# Patient Record
Sex: Male | Born: 1965 | Race: Black or African American | Hispanic: No | Smoking: Former smoker
Health system: Southern US, Community
[De-identification: ages and names within clinical notes are randomized; demographics above are authoritative.]

## PROBLEM LIST (undated history)

## (undated) DIAGNOSIS — R0600 Dyspnea, unspecified: Secondary | ICD-10-CM

## (undated) DIAGNOSIS — I639 Cerebral infarction, unspecified: Secondary | ICD-10-CM

## (undated) DIAGNOSIS — I10 Essential (primary) hypertension: Secondary | ICD-10-CM

## (undated) DIAGNOSIS — I509 Heart failure, unspecified: Secondary | ICD-10-CM

## (undated) HISTORY — PX: NO PAST SURGERIES: SHX2092

## (undated) HISTORY — DX: Cerebral infarction, unspecified: I63.9

---

## 2002-02-26 ENCOUNTER — Encounter: Payer: Self-pay | Admitting: Emergency Medicine

## 2002-02-26 ENCOUNTER — Emergency Department (HOSPITAL_COMMUNITY): Admission: EM | Admit: 2002-02-26 | Discharge: 2002-02-26 | Payer: Self-pay | Admitting: Emergency Medicine

## 2003-03-18 ENCOUNTER — Encounter: Payer: Self-pay | Admitting: Emergency Medicine

## 2003-03-18 ENCOUNTER — Emergency Department (HOSPITAL_COMMUNITY): Admission: EM | Admit: 2003-03-18 | Discharge: 2003-03-18 | Payer: Self-pay

## 2016-07-28 ENCOUNTER — Encounter (HOSPITAL_COMMUNITY): Payer: Self-pay | Admitting: Emergency Medicine

## 2016-07-28 ENCOUNTER — Ambulatory Visit (INDEPENDENT_AMBULATORY_CARE_PROVIDER_SITE_OTHER): Payer: Self-pay

## 2016-07-28 ENCOUNTER — Ambulatory Visit (HOSPITAL_COMMUNITY)
Admission: EM | Admit: 2016-07-28 | Discharge: 2016-07-28 | Disposition: A | Discharge status: Home / Self Care | Payer: Self-pay | Attending: Family Medicine | Admitting: Family Medicine

## 2016-07-28 DIAGNOSIS — R0989 Other specified symptoms and signs involving the circulatory and respiratory systems: Secondary | ICD-10-CM

## 2016-07-28 DIAGNOSIS — Z87891 Personal history of nicotine dependence: Secondary | ICD-10-CM | POA: Insufficient documentation

## 2016-07-28 DIAGNOSIS — I878 Other specified disorders of veins: Secondary | ICD-10-CM | POA: Insufficient documentation

## 2016-07-28 DIAGNOSIS — R0602 Shortness of breath: Secondary | ICD-10-CM | POA: Insufficient documentation

## 2016-07-28 DIAGNOSIS — R918 Other nonspecific abnormal finding of lung field: Secondary | ICD-10-CM | POA: Insufficient documentation

## 2016-07-28 DIAGNOSIS — R609 Edema, unspecified: Secondary | ICD-10-CM

## 2016-07-28 DIAGNOSIS — Z79899 Other long term (current) drug therapy: Secondary | ICD-10-CM | POA: Insufficient documentation

## 2016-07-28 DIAGNOSIS — R600 Localized edema: Secondary | ICD-10-CM | POA: Insufficient documentation

## 2016-07-28 HISTORY — DX: Essential (primary) hypertension: I10

## 2016-07-28 LAB — BASIC METABOLIC PANEL
Anion gap: 7 (ref 5–15)
BUN: 17 mg/dL (ref 6–20)
CO2: 25 mmol/L (ref 22–32)
Calcium: 8.7 mg/dL — ABNORMAL LOW (ref 8.9–10.3)
Chloride: 108 mmol/L (ref 101–111)
Creatinine, Ser: 0.9 mg/dL (ref 0.61–1.24)
GFR calc Af Amer: >60 mL/min (ref >60)
GFR calc non Af Amer: >60 mL/min (ref >60)
Glucose, Bld: 88 mg/dL (ref 65–99)
Potassium: 3.8 mmol/L (ref 3.5–5.1)
Sodium: 140 mmol/L (ref 135–145)

## 2016-07-28 LAB — BRAIN NATRIURETIC PEPTIDE: B Natriuretic Peptide: 414.3 pg/mL — ABNORMAL HIGH (ref 0.0–100.0)

## 2016-07-28 MED ORDER — FUROSEMIDE 40 MG PO TABS: 40.0000 mg | ORAL_TABLET | Freq: Two times a day (BID) | ORAL | 0 refills | Status: DC

## 2016-07-28 MED ORDER — HYDROCHLOROTHIAZIDE 25 MG PO TABS: 25.0000 mg | ORAL_TABLET | Freq: Every day | ORAL | 1 refills | Status: DC

## 2016-07-28 NOTE — ED Provider Notes (Signed)
CSN: [id number]     Arrival date & time 07/28/16  1837 History   First MD Initiated Contact with Patient 07/28/16 1951     Chief Complaint  Patient presents with  . Shortness of Breath   (Consider location/radiation/quality/duration/timing/severity/associated sxs/prior Treatment) HPI THIS IS A NEW PROBLEM. SHORTNESS OF BREATH FOR OVER 1 MONTH. STATES HE THOUGHT HE WAS HAVING PANIC ATTACKS. SMOKER UNTIL RECENTLY. OVER THE LAST 2 DAYS HAS HAD LEFT LEG SWELLING. HAS NOT HAD HIS HCTZ SINCE MARCH. NO CALF PAIN. NO HISTORY OF DVT.  Past Medical History:  Diagnosis Date  . Hypertension    History reviewed. No pertinent surgical history. History reviewed. No pertinent family history. Social History  Substance Use Topics  . Smoking status: Former Smoker    Packs/day: 1.00    Years: 20.00    Types: Cigarettes    Quit date: 06/28/2016  . Smokeless tobacco: Never Used  . Alcohol use Yes     Comment: Socially    Review of Systems  Denies: HEADACHE, NAUSEA, ABDOMINAL PAIN, CHEST PAIN, CONGESTION, DYSURIA,    Allergies  Review of patient's allergies indicates no known allergies.  Home Medications   Prior to Admission medications   Medication Sig Start Date End Date Taking? Authorizing Provider  hydrochlorothiazide (HYDRODIURIL) 25 MG tablet Take 25 mg by mouth daily.    Historical Provider, MD   Meds Ordered and Administered this Visit  Medications - No data to display  BP 163/99 (BP Location: Left Arm)   Pulse 102   Temp 99 F (37.2 C) (Oral)   Resp 20   SpO2 97%  No data found.   Physical Exam NURSES NOTES AND VITAL SIGNS REVIEWED. CONSTITUTIONAL: Well developed, well nourished, no acute distress HEENT: normocephalic, atraumatic EYES: Conjunctiva normal NECK:normal ROM, supple, no adenopathy PULMONARY:No respiratory distress, normal effort, FEW CRACKLES BOTH BASES. CV: PITTING EDEMA LEFT >RIGHT UP TO KNEE.  ABDOMINAL: Soft, ND, NT BS+, No CVAT MUSCULOSKELETAL: Normal ROM  of all extremities,  SKIN: warm and dry without rash PSYCHIATRIC: Mood and affect, behavior are normal  Urgent Care Course   Clinical Course    Procedures (including critical care time)  Labs Review Labs Reviewed - No data to display  Imaging Review Dg Chest 2 View  Result Date: 07/28/2016 CLINICAL DATA:  Shortness of breath EXAM: CHEST  2 VIEW COMPARISON:  None. FINDINGS: Shallow lung inflation. Cardiomediastinal silhouette is enlarged. There are streaky opacities at both lung bases, likely atelectasis. Small amount of fluid is seen within the right major and minor fissures. There is splaying of the carina, consistent with left atrial hypertrophy. No overt pulmonary edema. No focal airspace consolidation. No pneumothorax or sizable pleural effusion. IMPRESSION: 1. Enlarged cardiomediastinal silhouette: pericardial effusion versus cardiomegaly alone. 2. Shallow lung inflation with mild vascular congestion without overt pulmonary edema. Electronically Signed   By: Deatra Robinson M.D.   On: 07/28/2016 20:18   DISCUSSED WITH PATIENT PRIOR TO DISCHARGE.   Visual Acuity Review  Right Eye Distance:   Left Eye Distance:   Bilateral Distance:    Right Eye Near:   Left Eye Near:    Bilateral Near:         MDM   1. Peripheral edema   2. Pulmonary congestion     Patient is reassured that there are no issues that require transfer to higher level of care at this time or additional tests. Patient is advised to continue home symptomatic treatment. Patient is advised that if there  are new or worsening symptoms to attend the emergency department, contact primary care provider, or return to UC. Instructions of care provided discharged home in stable condition.    THIS NOTE WAS GENERATED USING A VOICE RECOGNITION SOFTWARE PROGRAM. ALL REASONABLE EFFORTS  WERE MADE TO PROOFREAD THIS DOCUMENT FOR ACCURACY.  I have verbally reviewed the discharge instructions with the patient. A printed AVS  was given to the patient.  All questions were answered prior to discharge.      Tharon Aquas, PA 07/28/16 2118

## 2016-07-28 NOTE — ED Triage Notes (Signed)
The patient presented to the [facility] with a complaint of shortness of breath x 1 month that has gotten worse for the last 2 weeks. The patient reported that he has been out off his HCTZ since April 2017.

## 2017-02-10 ENCOUNTER — Encounter (HOSPITAL_COMMUNITY): Payer: Self-pay

## 2017-02-10 ENCOUNTER — Ambulatory Visit (HOSPITAL_COMMUNITY)
Admission: EM | Admit: 2017-02-10 | Discharge: 2017-02-10 | Disposition: A | Discharge status: Home / Self Care | Payer: Self-pay | Attending: Family Medicine | Admitting: Family Medicine

## 2017-02-10 ENCOUNTER — Inpatient Hospital Stay (HOSPITAL_COMMUNITY)
Admission: EM | Admit: 2017-02-10 | Discharge: 2017-02-14 | DRG: 287 | Disposition: A | Discharge status: Home / Self Care | Payer: Self-pay | Attending: Internal Medicine | Admitting: Internal Medicine

## 2017-02-10 ENCOUNTER — Encounter (HOSPITAL_COMMUNITY): Payer: Self-pay | Admitting: Emergency Medicine

## 2017-02-10 ENCOUNTER — Emergency Department (HOSPITAL_COMMUNITY): Payer: Self-pay

## 2017-02-10 DIAGNOSIS — I10 Essential (primary) hypertension: Secondary | ICD-10-CM

## 2017-02-10 DIAGNOSIS — Z87891 Personal history of nicotine dependence: Secondary | ICD-10-CM

## 2017-02-10 DIAGNOSIS — I110 Hypertensive heart disease with heart failure: Principal | ICD-10-CM | POA: Diagnosis present

## 2017-02-10 DIAGNOSIS — E876 Hypokalemia: Secondary | ICD-10-CM | POA: Diagnosis present

## 2017-02-10 DIAGNOSIS — I272 Pulmonary hypertension, unspecified: Secondary | ICD-10-CM | POA: Diagnosis present

## 2017-02-10 DIAGNOSIS — I420 Dilated cardiomyopathy: Secondary | ICD-10-CM | POA: Diagnosis present

## 2017-02-10 DIAGNOSIS — F17200 Nicotine dependence, unspecified, uncomplicated: Secondary | ICD-10-CM

## 2017-02-10 DIAGNOSIS — I5020 Unspecified systolic (congestive) heart failure: Secondary | ICD-10-CM

## 2017-02-10 DIAGNOSIS — Z720 Tobacco use: Secondary | ICD-10-CM | POA: Diagnosis present

## 2017-02-10 DIAGNOSIS — Z8249 Family history of ischemic heart disease and other diseases of the circulatory system: Secondary | ICD-10-CM

## 2017-02-10 DIAGNOSIS — R739 Hyperglycemia, unspecified: Secondary | ICD-10-CM | POA: Diagnosis present

## 2017-02-10 DIAGNOSIS — Z79899 Other long term (current) drug therapy: Secondary | ICD-10-CM

## 2017-02-10 DIAGNOSIS — I509 Heart failure, unspecified: Secondary | ICD-10-CM

## 2017-02-10 DIAGNOSIS — Z9111 Patient's noncompliance with dietary regimen: Secondary | ICD-10-CM

## 2017-02-10 DIAGNOSIS — Z9119 Patient's noncompliance with other medical treatment and regimen: Secondary | ICD-10-CM

## 2017-02-10 DIAGNOSIS — Z9114 Patient's other noncompliance with medication regimen: Secondary | ICD-10-CM

## 2017-02-10 DIAGNOSIS — Z833 Family history of diabetes mellitus: Secondary | ICD-10-CM

## 2017-02-10 DIAGNOSIS — I160 Hypertensive urgency: Secondary | ICD-10-CM | POA: Diagnosis present

## 2017-02-10 DIAGNOSIS — Z791 Long term (current) use of non-steroidal anti-inflammatories (NSAID): Secondary | ICD-10-CM

## 2017-02-10 DIAGNOSIS — F1010 Alcohol abuse, uncomplicated: Secondary | ICD-10-CM | POA: Diagnosis present

## 2017-02-10 DIAGNOSIS — I2510 Atherosclerotic heart disease of native coronary artery without angina pectoris: Secondary | ICD-10-CM | POA: Diagnosis present

## 2017-02-10 DIAGNOSIS — J9601 Acute respiratory failure with hypoxia: Secondary | ICD-10-CM

## 2017-02-10 DIAGNOSIS — Z6841 Body Mass Index (BMI) 40.0 and over, adult: Secondary | ICD-10-CM

## 2017-02-10 DIAGNOSIS — I5042 Chronic combined systolic (congestive) and diastolic (congestive) heart failure: Secondary | ICD-10-CM

## 2017-02-10 DIAGNOSIS — I5043 Acute on chronic combined systolic (congestive) and diastolic (congestive) heart failure: Secondary | ICD-10-CM | POA: Diagnosis present

## 2017-02-10 HISTORY — DX: Dyspnea, unspecified: R06.00

## 2017-02-10 HISTORY — DX: Heart failure, unspecified: I50.9

## 2017-02-10 LAB — CBC WITH DIFFERENTIAL/PLATELET
Basophils Absolute: 0 10*3/uL (ref 0.0–0.1)
Basophils Relative: 0 %
EOS ABS: 0.2 10*3/uL (ref 0.0–0.7)
Eosinophils Relative: 2 %
HCT: 42.9 % (ref 39.0–52.0)
HEMOGLOBIN: 13.8 g/dL (ref 13.0–17.0)
LYMPHS ABS: 2.7 10*3/uL (ref 0.7–4.0)
Lymphocytes Relative: 29 %
MCH: 32.5 pg (ref 26.0–34.0)
MCHC: 32.2 g/dL (ref 30.0–36.0)
MCV: 100.9 fL — ABNORMAL HIGH (ref 78.0–100.0)
Monocytes Absolute: 0.6 10*3/uL (ref 0.1–1.0)
Monocytes Relative: 6 %
NEUTROS PCT: 63 %
Neutro Abs: 6 10*3/uL (ref 1.7–7.7)
Platelets: 222 10*3/uL (ref 150–400)
RBC: 4.25 MIL/uL (ref 4.22–5.81)
RDW: 12.8 % (ref 11.5–15.5)
WBC: 9.5 10*3/uL (ref 4.0–10.5)

## 2017-02-10 LAB — BASIC METABOLIC PANEL
ANION GAP: 10 (ref 5–15)
BUN: 14 mg/dL (ref 6–20)
CHLORIDE: 109 mmol/L (ref 101–111)
CO2: 22 mmol/L (ref 22–32)
Calcium: 8.6 mg/dL — ABNORMAL LOW (ref 8.9–10.3)
Creatinine, Ser: 0.93 mg/dL (ref 0.61–1.24)
GFR calc non Af Amer: >60 mL/min (ref >60)
Glucose, Bld: 103 mg/dL — ABNORMAL HIGH (ref 65–99)
POTASSIUM: 4 mmol/L (ref 3.5–5.1)
SODIUM: 141 mmol/L (ref 135–145)

## 2017-02-10 LAB — I-STAT TROPONIN, ED: TROPONIN I, POC: 0.04 ng/mL (ref 0.00–0.08)

## 2017-02-10 LAB — BRAIN NATRIURETIC PEPTIDE: B NATRIURETIC PEPTIDE 5: 752.4 pg/mL — AB (ref 0.0–100.0)

## 2017-02-10 MED ORDER — NITROGLYCERIN 2 % TD OINT
1.0000 [in_us] | TOPICAL_OINTMENT | Freq: Once | TRANSDERMAL | Status: AC
  Administered 2017-02-10: 1 [in_us] via TOPICAL
  Filled 2017-02-10: qty 1

## 2017-02-10 MED ORDER — ACETAMINOPHEN 325 MG PO TABS: 650.0000 mg | ORAL_TABLET | ORAL | Status: DC | PRN

## 2017-02-10 MED ORDER — FOLIC ACID 1 MG PO TABS
1.0000 mg | ORAL_TABLET | Freq: Every day | ORAL | Status: DC
  Administered 2017-02-10 – 2017-02-14 (×5): 1 mg via ORAL
  Filled 2017-02-10 (×5): qty 1

## 2017-02-10 MED ORDER — HYDRALAZINE HCL 25 MG PO TABS
25.0000 mg | ORAL_TABLET | Freq: Three times a day (TID) | ORAL | Status: DC
  Administered 2017-02-10 – 2017-02-11 (×2): 25 mg via ORAL
  Filled 2017-02-10 (×2): qty 1

## 2017-02-10 MED ORDER — LORAZEPAM 2 MG/ML IJ SOLN: 1.0000 mg | Freq: Four times a day (QID) | INTRAMUSCULAR | Status: DC | PRN

## 2017-02-10 MED ORDER — FUROSEMIDE 10 MG/ML IJ SOLN
40.0000 mg | Freq: Two times a day (BID) | INTRAMUSCULAR | Status: DC
  Administered 2017-02-10 – 2017-02-12 (×5): 40 mg via INTRAVENOUS
  Filled 2017-02-10 (×5): qty 4

## 2017-02-10 MED ORDER — ENOXAPARIN SODIUM 60 MG/0.6ML ~~LOC~~ SOLN
60.0000 mg | SUBCUTANEOUS | Status: DC
  Administered 2017-02-10 – 2017-02-11 (×2): 60 mg via SUBCUTANEOUS
  Filled 2017-02-10 (×2): qty 0.6

## 2017-02-10 MED ORDER — VITAMIN B-1 100 MG PO TABS
100.0000 mg | ORAL_TABLET | Freq: Every day | ORAL | Status: DC
  Administered 2017-02-10 – 2017-02-14 (×5): 100 mg via ORAL
  Filled 2017-02-10 (×6): qty 1

## 2017-02-10 MED ORDER — HYDRALAZINE HCL 20 MG/ML IJ SOLN
10.0000 mg | INTRAMUSCULAR | Status: DC | PRN
  Administered 2017-02-10: 10 mg via INTRAVENOUS
  Filled 2017-02-10: qty 1

## 2017-02-10 MED ORDER — FUROSEMIDE 10 MG/ML IJ SOLN
40.0000 mg | Freq: Once | INTRAMUSCULAR | Status: AC
  Administered 2017-02-10: 40 mg via INTRAVENOUS
  Filled 2017-02-10: qty 4

## 2017-02-10 MED ORDER — ADULT MULTIVITAMIN W/MINERALS CH
1.0000 | ORAL_TABLET | Freq: Every day | ORAL | Status: DC
  Administered 2017-02-10 – 2017-02-14 (×5): 1 via ORAL
  Filled 2017-02-10 (×5): qty 1

## 2017-02-10 MED ORDER — THIAMINE HCL 100 MG/ML IJ SOLN
100.0000 mg | Freq: Every day | INTRAMUSCULAR | Status: DC
  Filled 2017-02-10: qty 2

## 2017-02-10 MED ORDER — ONDANSETRON HCL 4 MG/2ML IJ SOLN: 4.0000 mg | Freq: Four times a day (QID) | INTRAMUSCULAR | Status: DC | PRN

## 2017-02-10 MED ORDER — SODIUM CHLORIDE 0.9 % IV SOLN: 250.0000 mL | INTRAVENOUS | Status: DC | PRN

## 2017-02-10 MED ORDER — LISINOPRIL 10 MG PO TABS
10.0000 mg | ORAL_TABLET | Freq: Every day | ORAL | Status: DC
  Administered 2017-02-10: 10 mg via ORAL
  Filled 2017-02-10: qty 1

## 2017-02-10 MED ORDER — SODIUM CHLORIDE 0.9% FLUSH: 3.0000 mL | INTRAVENOUS | Status: DC | PRN

## 2017-02-10 MED ORDER — ASPIRIN EC 81 MG PO TBEC
81.0000 mg | DELAYED_RELEASE_TABLET | Freq: Every day | ORAL | Status: DC
  Administered 2017-02-10 – 2017-02-14 (×5): 81 mg via ORAL
  Filled 2017-02-10 (×5): qty 1

## 2017-02-10 MED ORDER — SODIUM CHLORIDE 0.9% FLUSH
3.0000 mL | Freq: Two times a day (BID) | INTRAVENOUS | Status: DC
  Administered 2017-02-10 – 2017-02-14 (×7): 3 mL via INTRAVENOUS

## 2017-02-10 MED ORDER — LORAZEPAM 1 MG PO TABS
1.0000 mg | ORAL_TABLET | Freq: Four times a day (QID) | ORAL | Status: DC | PRN
  Administered 2017-02-10: 1 mg via ORAL
  Filled 2017-02-10: qty 1

## 2017-02-10 NOTE — Discharge Instructions (Signed)
Diagnosed with congestive heart failure. This means your heart cannot keep up the amount of fluid that you are retaining. Therefore we are sending you to the emergency department for further monitoring and treatment.

## 2017-02-10 NOTE — ED Triage Notes (Signed)
Pt states that he began having shortness of breath last night. He reports hx of same when he was out of his HTN meds. He reports he is currently out of meds again. No distress noted, skin warm and dry.

## 2017-02-10 NOTE — ED Notes (Signed)
Admitting provider at bedside.

## 2017-02-10 NOTE — ED Provider Notes (Signed)
[facility]    CSN: [id number] Arrival date & time: 02/10/17  1212     History   Chief Complaint Chief Complaint  Patient presents with  . Shortness of Breath    HPI Nicholas Kelly. is a 51 y.o. male.   The patient presented to the [facility] with a complaint of SOB that started last night. He's had a history of congestive heart failure dating back to last October. He's had no chest pain but he is quite short of breath especially when he walks. He's also had significant increase in his pedal edema.      Past Medical History:  Diagnosis Date  . Hypertension     There are no active problems to display for this patient.   History reviewed. No pertinent surgical history.     Home Medications    Prior to Admission medications   Medication Sig Start Date End Date Taking? Authorizing Provider  hydrochlorothiazide (HYDRODIURIL) 25 MG tablet Take 1 tablet (25 mg total) by mouth daily. 07/28/16  Yes Tharon Aquas, PA    Family History History reviewed. No pertinent family history.  Social History Social History  Substance Use Topics  . Smoking status: Former Smoker    Packs/day: 1.00    Years: 20.00    Types: Cigarettes    Quit date: 06/28/2016  . Smokeless tobacco: Never Used  . Alcohol use Yes     Comment: Socially     Allergies   Patient has no known allergies.   Review of Systems Review of Systems  HENT: Negative.   Respiratory: Positive for shortness of breath.   Cardiovascular: Positive for leg swelling.  Neurological: Negative.   All other systems reviewed and are negative.    Physical Exam Triage Vital Signs ED Triage Vitals  Enc Vitals Group     BP 02/10/17 1311 (!) 173/119     Pulse Rate 02/10/17 1311 96     Resp 02/10/17 1311 20     Temp 02/10/17 1311 98.2 F (36.8 C)     Temp Source 02/10/17 1311 Oral     SpO2 02/10/17 1311 96 %     Weight --      Height --      Head Circumference --      Peak Flow --      Pain  Score 02/10/17 1310 0     Pain Loc --      Pain Edu? --      Excl. in GC? --    No data found.   Updated Vital Signs BP (!) 173/119 (BP Location: Right Arm)   Pulse 96   Temp 98.2 F (36.8 C) (Oral)   Resp 20   SpO2 96%    Physical Exam  Constitutional: He is oriented to person, place, and time. He appears well-developed and well-nourished.  HENT:  Right Ear: External ear normal.  Left Ear: External ear normal.  Mouth/Throat: Oropharynx is clear and moist.  Eyes: Conjunctivae and EOM are normal. Pupils are equal, round, and reactive to light.  Neck: Normal range of motion. Neck supple.  Cardiovascular: Normal rate, regular rhythm and normal heart sounds.   Pulmonary/Chest: He has wheezes.  Patient is clearly short of breath while sitting. He is able to carry on a conversation but he is short of breath. He has audible wheezes.  Musculoskeletal: Normal range of motion. He exhibits edema.  Neurological: He is alert and oriented to person, place, and time.  Skin: Skin is warm and dry.  Nursing note and vitals reviewed.    UC Treatments / Results  Labs (all labs ordered are listed, but only abnormal results are displayed) Labs Reviewed - No data to display  EKG  EKG Interpretation None       Radiology No results found.  Procedures Procedures (including critical care time)  Medications Ordered in UC Medications - No data to display   Initial Impression / Assessment and Plan / UC Course  I have reviewed the triage vital signs and the nursing notes.  Pertinent labs & imaging results that were available during my care of the patient were reviewed by me and considered in my medical decision making (see chart for details).     Final Clinical Impressions(s) / UC Diagnoses   Final diagnoses:  Systolic congestive heart failure, unspecified HF chronicity (HCC)    New Prescriptions New Prescriptions   No medications on file  Patient discharged and taken to  [facility] emergency department.   Elvina Sidle, MD 02/10/17 1335

## 2017-02-10 NOTE — ED Notes (Signed)
Pt. Oxygen saturations low 90's. Pt. Placed on 2L via nasal cannula at this time.

## 2017-02-10 NOTE — ED Notes (Signed)
Pt made aware of bed assignment 

## 2017-02-10 NOTE — ED Notes (Signed)
Paged K.Schorr to Jonny Ruiz, [place]

## 2017-02-10 NOTE — ED Triage Notes (Signed)
The patient presented to the [facility] with a complaint of SOB that started last night.

## 2017-02-10 NOTE — ED Provider Notes (Addendum)
[facility] Note   CSN: [id number] Arrival date & time: 02/10/17  1344     History   Chief Complaint Chief Complaint  Patient presents with  . Shortness of Breath    HPI Nicholas Kelly. is a 51 y.o. male.Complains of shortness breath gradual onset 2:30 AM on 02/07/2017. Shortness of breath is worse with lying down improved with sitting up.Marland Kitchen He's been noncompliant with his HCTZ for the past 3 days as he's run out. He denies any chest pain. Seen at urgent care center earlier today, sent here for further evaluation.  HPI  Past Medical History:  Diagnosis Date  . Hypertension     There are no active problems to display for this patient.   History reviewed. No pertinent surgical history.     Home Medications    Prior to Admission medications   Medication Sig Start Date End Date Taking? Authorizing Provider  hydrochlorothiazide (HYDRODIURIL) 25 MG tablet Take 1 tablet (25 mg total) by mouth daily. 07/28/16   Tharon Aquas, PA    Family History History reviewed. No pertinent family history.  Social History Social History  Substance Use Topics  . Smoking status: Former Smoker    Packs/day: 1.00    Years: 20.00    Types: Cigarettes    Quit date: 06/28/2016  . Smokeless tobacco: Never Used  . Alcohol use Yes     Comment: Socially    Drinks approximately 3 alcoholic beverages per day Allergies   Patient has no known allergies.   Review of Systems Review of Systems  Constitutional: Negative.   HENT: Negative.   Respiratory: Positive for shortness of breath.   Cardiovascular: Positive for leg swelling.  Gastrointestinal: Negative.   Musculoskeletal: Negative.   Skin: Negative.   Neurological: Negative.   Psychiatric/Behavioral: Negative.   All other systems reviewed and are negative.    Physical Exam Updated Vital Signs BP (!) 170/132 (BP Location: Right Arm)   Pulse (!) 102   Temp 98.8 F (37.1 C) (Oral)   Resp (!) 24   Ht  5\' 8"  (1.727 m)   Wt 280 lb (127 kg)   SpO2 95%   BMI 42.57 kg/m   Physical Exam  Constitutional: He appears well-developed and well-nourished.  Appears mildly dyspneic  HENT:  Head: Normocephalic and atraumatic.  Eyes: Conjunctivae are normal. Pupils are equal, round, and reactive to light.  Neck: Neck supple. JVD present. No tracheal deviation present. No thyromegaly present.  Cardiovascular: Normal rate and regular rhythm.   No murmur heard. Pulmonary/Chest: Effort normal and breath sounds normal.  Mildly dyspneic, tachypnea  Abdominal: Soft. Bowel sounds are normal. He exhibits no distension. There is no tenderness.  Obese  Musculoskeletal: Normal range of motion. He exhibits edema. He exhibits no tenderness.  1+ pretibial pitting edema bilaterally  Neurological: He is alert. Coordination normal.  Skin: Skin is warm and dry. No rash noted.  Psychiatric: He has a normal mood and affect.  Nursing note and vitals reviewed.    ED Treatments / Results  Labs (all labs ordered are listed, but only abnormal results are displayed) Labs Reviewed  CBC WITH DIFFERENTIAL/PLATELET  BASIC METABOLIC PANEL  I-STAT TROPOININ, ED   Results for orders placed or performed during the hospital encounter of 02/10/17  CBC with Differential/Platelet  Result Value Ref Range   WBC 9.5 4.0 - 10.5 K/uL   RBC 4.25 4.22 - 5.81 MIL/uL   Hemoglobin 13.8 13.0 - 17.0 g/dL  HCT 42.9 39.0 - 52.0 %   MCV 100.9 (H) 78.0 - 100.0 fL   MCH 32.5 26.0 - 34.0 pg   MCHC 32.2 30.0 - 36.0 g/dL   RDW 16.1 09.6 - 04.5 %   Platelets 222 150 - 400 K/uL   Neutrophils Relative % 63 %   Neutro Abs 6.0 1.7 - 7.7 K/uL   Lymphocytes Relative 29 %   Lymphs Abs 2.7 0.7 - 4.0 K/uL   Monocytes Relative 6 %   Monocytes Absolute 0.6 0.1 - 1.0 K/uL   Eosinophils Relative 2 %   Eosinophils Absolute 0.2 0.0 - 0.7 K/uL   Basophils Relative 0 %   Basophils Absolute 0.0 0.0 - 0.1 K/uL  Basic metabolic panel  Result Value  Ref Range   Sodium 141 135 - 145 mmol/L   Potassium 4.0 3.5 - 5.1 mmol/L   Chloride 109 101 - 111 mmol/L   CO2 22 22 - 32 mmol/L   Glucose, Bld 103 (H) 65 - 99 mg/dL   BUN 14 6 - 20 mg/dL   Creatinine, Ser 4.09 0.61 - 1.24 mg/dL   Calcium 8.6 (L) 8.9 - 10.3 mg/dL   GFR calc non Af Amer >60 >60 mL/min   GFR calc Af Amer >60 >60 mL/min   Anion gap 10 5 - 15  I-stat troponin, ED  Result Value Ref Range   Troponin i, poc 0.04 0.00 - 0.08 ng/mL   Comment 3           Dg Chest 2 View  Result Date: 02/10/2017 CLINICAL DATA:  Shortness of breath and cough for 2 days. Patient reports being unable to take his antihypertensive medication for the past 4 days. Former smoker. EXAM: CHEST  2 VIEW COMPARISON:  Chest x-ray of July 28, 2016 FINDINGS: The lungs are adequately inflated. There is no focal infiltrate. The interstitial markings are coarse The right hilar structures are prominent. The pulmonary vascularity is not engorged. The cardiac silhouette is enlarged but stable. There is calcification in the wall of the thoracic aorta. There is no pleural effusion. The bony thorax exhibits no acute abnormality. IMPRESSION: Chronic bronchitic changes with superimposed low-grade CHF with mild interstitial edema. There is no alveolar pneumonia. Thoracic aortic atherosclerosis. Electronically Signed   By: David  [place] M.D.   On: 02/10/2017 14:42   EKG  EKG Interpretation  Date/Time:  Wednesday February 10 2017 13:51:17 EDT Ventricular Rate:  89 PR Interval:  176 QRS Duration: 108 QT Interval:  402 QTC Calculation: 489 R Axis:   -31 Text Interpretation:  Normal sinus rhythm Biatrial enlargement Left axis deviation Left ventricular hypertrophy Nonspecific T wave abnormality Prolonged QT Abnormal ECG No old tracing to compare Confirmed by Ethelda Chick  MD, Panayiota Larkin [phone]) on 02/10/2017 3:09:26 PM     Chest x-ray viewed by me Radiology Dg Chest 2 View  Result Date: 02/10/2017 CLINICAL DATA:  Shortness of breath  and cough for 2 days. Patient reports being unable to take his antihypertensive medication for the past 4 days. Former smoker. EXAM: CHEST  2 VIEW COMPARISON:  Chest x-ray of July 28, 2016 FINDINGS: The lungs are adequately inflated. There is no focal infiltrate. The interstitial markings are coarse The right hilar structures are prominent. The pulmonary vascularity is not engorged. The cardiac silhouette is enlarged but stable. There is calcification in the wall of the thoracic aorta. There is no pleural effusion. The bony thorax exhibits no acute abnormality. IMPRESSION: Chronic bronchitic changes with superimposed low-grade CHF  with mild interstitial edema. There is no alveolar pneumonia. Thoracic aortic atherosclerosis. Electronically Signed   By: David  [place] M.D.   On: 02/10/2017 14:42    Procedures Procedures (including critical care time)  Medications Ordered in ED Medications  furosemide (LASIX) injection 40 mg (not administered)     Initial Impression / Assessment and Plan / ED Course  I have reviewed the triage vital signs and the nursing notes.  Pertinent labs & imaging results that were available during my care of the patient were reviewed by me and considered in my medical decision making (see chart for details). 5:25 PM patient had diuresed approximately 1200 mL of urine after treatment with intravenous Lasix and topical nitroglycerin and feels improved.    I consulted Hospitalist on call  Dr Gonzella Lex who will arrange for 23 hour observation, telemetry  Final Clinical Impressions(s) / ED Diagnoses  Diagnosis #1 congestive heart failure #2 hypertension #3 medication noncompliance Final diagnoses:  None    New Prescriptions New Prescriptions   No medications on file     Doug Sou, MD 02/10/17 1738    Doug Sou, MD 02/10/17 1739

## 2017-02-10 NOTE — H&P (Addendum)
TRH H&P   Patient Demographics:    Nicholas Kelly, is a 51 y.o. male  MRN: [id number]   DOB - December 04, 1965  Admit Date - 02/10/2017  Outpatient Primary MD for the patient is No PCP Per Patient  Referring MD: Dr. Ethelda Chick  Outpatient Specialists: none  Patient coming from: None  Chief Complaint  Patient presents with  . Shortness of Breath      HPI:    Nicholas Kelly  is a 51 y.o. obese male with history of hypertension who presented to the urgent care with acute onset of shortness of breath starting last night. Patient was diagnosed with congestive heart failure in October 2017 and discharged home to continue his HCTZ. Patient reports that he ran out of his HCTZ prescription few days back. Last time he had acute onset of shortness of breath and felt like a panic attack. He denied any chest pain, orthopnea or PND. Denies headache, dizziness, fevers, chills, nausea, vomiting, abdominal pain, dysuria or diarrhea. Denies recent travel or sick contact. Denies recent illness. She reports that he is not following any diet regimen or consult restriction. He has quit smoking for the past 2 weeks (prior to that was smoking at least one pack per day). He continues to drink everyday but denies any illicit drug use. Patient was told to come to the ED. He is unaware of having leg swellings.  Course in the ED He was tachycardic to 110, tachypneic in the 20s, had accelerated hypertension with BP of 174/11 mmhg. he was desaturating to the 80s and placed on nasal cannula 3 L after which sats improved to mid 90s. Blood work done in the ED was normal. Chest x-ray showed findings of CHF with pulmonary edema.  Patient was given 40 mg IV Lasix after which he diuresed about 1200 mL and felt better. Nitroglycerin ointment was applied. Hospitalist admission requested to telemetry.   Review of  systems:    In addition to the HPI above, No Fever-chills, No Headache, No changes with Vision or hearing, No problems swallowing food or Liquids, No Chest pain, Cough ; Shortness of breath No Abdominal pain, No Nausea or vomiting, Bowel movements are regular, No Blood in stool or Urine, No dysuria, No new skin rashes or bruises, No new joints pains-aches,  No new weakness, tingling, numbness in any extremity, No recent weight gain or loss, No polyuria, polydypsia or polyphagia, No significant Mental Stressors.  A full 10 point Review of Systems was done, except as stated above, all other Review of Systems were negative.   With Past History of the following :    Past Medical History:  Diagnosis Date  . Hypertension       Past surgical history No past Surgical history   Social History:     Social History  Substance Use Topics  . Smoking status: Former Smoker  Packs/day: 1.00    Years: 20.00    Types: Cigarettes    Quit date: 06/28/2016  . Smokeless tobacco: Never Used  . Alcohol use Yes     Comment: Socially     Lives - At home  Mobility - independent   Family History :   Mother had diabetes and heart disease   Home Medications:   Prior to Admission medications   Medication Sig Start Date End Date Taking? Authorizing Provider  hydrochlorothiazide (HYDRODIURIL) 25 MG tablet Take 1 tablet (25 mg total) by mouth daily. 07/28/16  Yes Tharon Aquas, PA  ibuprofen (ADVIL,MOTRIN) 200 MG tablet Take 200 mg by mouth every 6 (six) hours as needed for moderate pain.   Yes Historical Provider, MD     Allergies:    No Known Allergies   Physical Exam:   Vitals  Blood pressure (!) 152/112, pulse (!) 103, temperature 98.8 F (37.1 C), temperature source Oral, resp. rate (!) 22, height 5\' 8"  (1.727 m), weight 127 kg (280 lb), SpO2 93 %.   Gen.: Middle aged obese male not in distress HEENT: No pallor, moist mucosa, supple neck, no JVD Chest: Diminished  bilateral breath sounds due to body habitus CVS: Normal S1 and S2, no murmurs rub or gallop GI: Soft, nondistended, nontender, bowel sounds present Musculoskeletal: 1+ pitting edema bilaterally CNS: Alert and oriented   Data Review:    CBC  Recent Labs Lab 02/10/17 1525  WBC 9.5  HGB 13.8  HCT 42.9  PLT 222  MCV 100.9*  MCH 32.5  MCHC 32.2  RDW 12.8  LYMPHSABS 2.7  MONOABS 0.6  EOSABS 0.2  BASOSABS 0.0   ------------------------------------------------------------------------------------------------------------------  Chemistries   Recent Labs Lab 02/10/17 1525  NA 141  K 4.0  CL 109  CO2 22  GLUCOSE 103*  BUN 14  CREATININE 0.93  CALCIUM 8.6*   ------------------------------------------------------------------------------------------------------------------ estimated creatinine clearance is 120.6 mL/min (by C-G formula based on SCr of 0.93 mg/dL). ------------------------------------------------------------------------------------------------------------------ No results for input(s): TSH, T4TOTAL, T3FREE, THYROIDAB in the last 72 hours.  Invalid input(s): FREET3  Coagulation profile No results for input(s): INR, PROTIME in the last 168 hours. ------------------------------------------------------------------------------------------------------------------- No results for input(s): DDIMER in the last 72 hours. -------------------------------------------------------------------------------------------------------------------  Cardiac Enzymes No results for input(s): CKMB, TROPONINI, MYOGLOBIN in the last 168 hours.  Invalid input(s): CK ------------------------------------------------------------------------------------------------------------------    Component Value Date/Time   BNP 414.3 (H) 07/28/2016 2052     ---------------------------------------------------------------------------------------------------------------  Urinalysis No results  found for: COLORURINE, APPEARANCEUR, LABSPEC, PHURINE, GLUCOSEU, HGBUR, BILIRUBINUR, KETONESUR, PROTEINUR, UROBILINOGEN, NITRITE, LEUKOCYTESUR  ----------------------------------------------------------------------------------------------------------------   Imaging Results:    Dg Chest 2 View  Result Date: 02/10/2017 CLINICAL DATA:  Shortness of breath and cough for 2 days. Patient reports being unable to take his antihypertensive medication for the past 4 days. Former smoker. EXAM: CHEST  2 VIEW COMPARISON:  Chest x-ray of July 28, 2016 FINDINGS: The lungs are adequately inflated. There is no focal infiltrate. The interstitial markings are coarse The right hilar structures are prominent. The pulmonary vascularity is not engorged. The cardiac silhouette is enlarged but stable. There is calcification in the wall of the thoracic aorta. There is no pleural effusion. The bony thorax exhibits no acute abnormality. IMPRESSION: Chronic bronchitic changes with superimposed low-grade CHF with mild interstitial edema. There is no alveolar pneumonia. Thoracic aortic atherosclerosis. Electronically Signed   By: David  [place] M.D.   On: 02/10/2017 14:42    My personal review of EKG: Normal sinus rhythm at  89,  LAD with LVH, no ST-T changes  Assessment & Plan:    Principal Problem:   Acute CHF (congestive heart failure) (HCC) Admit to telemetry. Risk factors include uncontrolled hypertension, obesity, tobacco and alcohol abuse and? Family history of heart disease. Diuresed with IV Lasix 40 mg twice a day. Strict I/O and daily weight. O2 via nasal cannula. Obtain 2-D echo. Start on aspirin 81 mg daily. Hold off on beta blocker given acute CHF symptoms. Add lisinopril 10 mg daily and hydralazine 25 mg 3 times a day.  Check lipid panel and no globe and A1c. Check urine drug screen. (Reports remote history of cocaine use) Needs heart failure teaching. Encouraged on quitting smoking.   Active Problems:    Hypertensive urgency Dietary and medication nonadherence. ran out of his prescription few days back. Placed on nitroglycerin patch in the ED. Started on IV Lasix, lisinopril and hydralazine. Will place on when necessary IV hydralazine.  Acute respiratory failure with hypoxia Secondary to acute CHF. Management as above.    Obesity Needs counseling on diet and exercise.    Tobacco abuse Reports being a heavy smoker in the past. Quit for the past 2 weeks.  Alcohol use Drinks about 6 packs of beer daily in addition to few liquor 3-4 days a week. Monitor on CIWA.      DVT Prophylaxis  Lovenox -   AM Labs Ordered, also please review Full Orders  Family Communication: Admission, patients condition and plan of care including tests being ordered have been discussed with the patient   Code Status Full code  Likely DC to  home  Condition GUARDED   Consults called: none  Admission status: inpatient  Time spent in minutes :70   Eddie North M.D on 02/10/2017 at 6:09 PM  Between 7am to 7pm - Pager - [phone]. After 7pm go to [email or web address] - password [facility]  [facility] - Office  [phone]

## 2017-02-10 NOTE — ED Notes (Signed)
EDP at bedside  

## 2017-02-10 NOTE — Progress Notes (Signed)
Patient transferred to [address] from ED. Patient oriented to room CCMD notified. No complaints of pain at this time. Patient in bed resting. Will continue to monitor.

## 2017-02-11 ENCOUNTER — Inpatient Hospital Stay (HOSPITAL_COMMUNITY): Payer: Self-pay

## 2017-02-11 DIAGNOSIS — I160 Hypertensive urgency: Secondary | ICD-10-CM

## 2017-02-11 DIAGNOSIS — I509 Heart failure, unspecified: Secondary | ICD-10-CM

## 2017-02-11 LAB — BASIC METABOLIC PANEL
Anion gap: 8 (ref 5–15)
BUN: 13 mg/dL (ref 6–20)
CHLORIDE: 105 mmol/L (ref 101–111)
CO2: 29 mmol/L (ref 22–32)
Calcium: 8.8 mg/dL — ABNORMAL LOW (ref 8.9–10.3)
Creatinine, Ser: 1.05 mg/dL (ref 0.61–1.24)
GFR calc Af Amer: >60 mL/min (ref >60)
GFR calc non Af Amer: >60 mL/min (ref >60)
GLUCOSE: 92 mg/dL (ref 65–99)
Potassium: 3.9 mmol/L (ref 3.5–5.1)
Sodium: 142 mmol/L (ref 135–145)

## 2017-02-11 LAB — ECHOCARDIOGRAM COMPLETE
CHL CUP DOP CALC LVOT VTI: 14.2 cm
CHL CUP RV SYS PRESS: 48 mmHg
CHL CUP TV REG PEAK VELOCITY: 327 cm/s
EERAT: 16.67
FS: 10 % — AB (ref 28–44)
Height: 68 in
IVS/LV PW RATIO, ED: 1
LA ID, A-P, ES: 53 mm
LA diam index: 2.19 cm/m2
LA vol A4C: 116 ml
LA vol index: 40.6 mL/m2
LA vol: 98.3 mL
LDCA: 4.91 cm2
LEFT ATRIUM END SYS DIAM: 53 mm
LV E/e'average: 16.67
LVEEMED: 16.67
LVELAT: 6.96 cm/s
LVOT diameter: 25 mm
LVOT peak grad rest: 3 mmHg
LVOTPV: 81.1 cm/s
LVOTSV: 70 mL
MV Peak grad: 5 mmHg
MV pk A vel: 66.1 m/s
MVPKEVEL: 116 m/s
PW: 13 mm — AB (ref 0.6–1.1)
RV LATERAL S' VELOCITY: 12.5 cm/s
RV TAPSE: 20.4 mm
TDI e' lateral: 6.96
TDI e' medial: 6.2
TR max vel: 327 cm/s
Weight: 4753.6 oz

## 2017-02-11 LAB — LIPID PANEL
CHOL/HDL RATIO: 4.5 ratio
Cholesterol: 153 mg/dL (ref 0–200)
HDL: 34 mg/dL — ABNORMAL LOW (ref >40)
LDL CALC: 104 mg/dL — AB (ref 0–99)
TRIGLYCERIDES: 74 mg/dL (ref <150)
VLDL: 15 mg/dL (ref 0–40)

## 2017-02-11 LAB — HIV ANTIBODY (ROUTINE TESTING W REFLEX): HIV SCREEN 4TH GENERATION: NONREACTIVE

## 2017-02-11 MED ORDER — AMLODIPINE BESYLATE 10 MG PO TABS: 10.0000 mg | ORAL_TABLET | Freq: Every day | ORAL | Status: DC

## 2017-02-11 MED ORDER — POTASSIUM CHLORIDE CRYS ER 20 MEQ PO TBCR
20.0000 meq | EXTENDED_RELEASE_TABLET | Freq: Two times a day (BID) | ORAL | Status: DC
  Administered 2017-02-11 – 2017-02-14 (×7): 20 meq via ORAL
  Filled 2017-02-11 (×7): qty 1

## 2017-02-11 NOTE — Progress Notes (Addendum)
PROGRESS NOTE    Nicholas Kelly.  PNP:[id number] DOB: January 06, 1966 DOA: 02/10/2017 PCP: No PCP Per Patient  Brief Narrative:Nicholas Kelly  is a 51 y.o. obese male with history of hypertension, smoker who presented to the urgent care with acute onset of shortness of breath starting last night. Patient was diagnosed with congestive heart failure in October 2017 and discharged home to continue his HCTZ. Patient reports that he ran out of his HCTZ prescription few days back. Last time he had acute onset of shortness of breath and felt like a panic attack. Chest x-ray showed findings of CHF with pulmonary edema.  Assessment & Plan:  Acute CHF (congestive heart failure) (HCC) -improving -suspect diastolic CHF, await ECHO -continue IV lasix 40mg  q12 with KCL -monitor I/Os, weights -negative 4.2L -CHF education/ RD consult    Hypertensive urgency -due to volume overload, improving with diuresis -hold hydralazine -add low dose ACE   Mild hyperglycemia -FU Hba1c    Obesity -lifestyle modification    Tobacco abuse -Reports being a heavy smoker in the past. Quit for the past 2 weeks.  Alcohol use -continue thiamine, Monitor on CIWA.  DVT Prophylaxis  Lovenox    FUll Code Family Communication:None at bedside DIspo: home pending diuresis, ECHO  Procedures:   Subjective: Breathing better, not back to baseline yet Objective: Vitals:   02/10/17 2026 02/11/17 0007 02/11/17 0421 02/11/17 0900  BP: (!) 171/115 (!) 142/95 110/70 131/73  Pulse: 96 79 89 90  Resp: (!) 22 18 20 18   Temp: 98.3 F (36.8 C) 97.7 F (36.5 C) 98.3 F (36.8 C) 98.5 F (36.9 C)  TempSrc: Oral Oral Oral   SpO2: 99% 100% 100% 98%  Weight: 134.9 kg (297 lb 4.8 oz)  134.8 kg (297 lb 1.6 oz)   Height: 5\' 8"  (1.727 m)       Intake/Output Summary (Last 24 hours) at 02/11/17 1109 Last data filed at 02/11/17 1054  Gross per 24 hour  Intake              360 ml  Output             5275 ml    Net            -4915 ml   Filed Weights   02/10/17 1348 02/10/17 2026 02/11/17 0421  Weight: 127 kg (280 lb) 134.9 kg (297 lb 4.8 oz) 134.8 kg (297 lb 1.6 oz)    Examination:  General exam: Obese male, AAOx3, no distress Respiratory system: fine basilar crackles Cardiovascular system: S1 & S2 heard, RRR. No JVD, murmurs Gastrointestinal system: Abdomen is nondistended, soft and nontender. . Normal bowel sounds heard. Central nervous system: Alert and oriented. No focal neurological deficits. Extremities: Symmetric 5 x 5 power, 1 plus edema Skin: No rashes, lesions or ulcers Psychiatry: Judgement and insight appear normal. Mood & affect appropriate.     Data Reviewed:   CBC:  Recent Labs Lab 02/10/17 1525  WBC 9.5  NEUTROABS 6.0  HGB 13.8  HCT 42.9  MCV 100.9*  PLT 222   Basic Metabolic Panel:  Recent Labs Lab 02/10/17 1525 02/11/17 0220  NA 141 142  K 4.0 3.9  CL 109 105  CO2 22 29  GLUCOSE 103* 92  BUN 14 13  CREATININE 0.93 1.05  CALCIUM 8.6* 8.8*   GFR: Estimated Creatinine Clearance: 110.6 mL/min (by C-G formula based on SCr of 1.05 mg/dL). Liver Function Tests: No results for input(s): AST, ALT, ALKPHOS, BILITOT, PROT,  ALBUMIN in the last 168 hours. No results for input(s): LIPASE, AMYLASE in the last 168 hours. No results for input(s): AMMONIA in the last 168 hours. Coagulation Profile: No results for input(s): INR, PROTIME in the last 168 hours. Cardiac Enzymes: No results for input(s): CKTOTAL, CKMB, CKMBINDEX, TROPONINI in the last 168 hours. BNP (last 3 results) No results for input(s): PROBNP in the last 8760 hours. HbA1C: No results for input(s): HGBA1C in the last 72 hours. CBG: No results for input(s): GLUCAP in the last 168 hours. Lipid Profile:  Recent Labs  02/11/17 0220  CHOL 153  HDL 34*  LDLCALC 104*  TRIG 74  CHOLHDL 4.5   Thyroid Function Tests: No results for input(s): TSH, T4TOTAL, FREET4, T3FREE, THYROIDAB in the  last 72 hours. Anemia Panel: No results for input(s): VITAMINB12, FOLATE, FERRITIN, TIBC, IRON, RETICCTPCT in the last 72 hours. Urine analysis: No results found for: COLORURINE, APPEARANCEUR, LABSPEC, PHURINE, GLUCOSEU, HGBUR, BILIRUBINUR, KETONESUR, PROTEINUR, UROBILINOGEN, NITRITE, LEUKOCYTESUR Sepsis Labs: @LABRCNTIP (procalcitonin:4,lacticidven:4)  )No results found for this or any previous visit (from the past 240 hour(s)).       Radiology Studies: Dg Chest 2 View  Result Date: 02/10/2017 CLINICAL DATA:  Shortness of breath and cough for 2 days. Patient reports being unable to take his antihypertensive medication for the past 4 days. Former smoker. EXAM: CHEST  2 VIEW COMPARISON:  Chest x-ray of July 28, 2016 FINDINGS: The lungs are adequately inflated. There is no focal infiltrate. The interstitial markings are coarse The right hilar structures are prominent. The pulmonary vascularity is not engorged. The cardiac silhouette is enlarged but stable. There is calcification in the wall of the thoracic aorta. There is no pleural effusion. The bony thorax exhibits no acute abnormality. IMPRESSION: Chronic bronchitic changes with superimposed low-grade CHF with mild interstitial edema. There is no alveolar pneumonia. Thoracic aortic atherosclerosis. Electronically Signed   By: David  [place] M.D.   On: 02/10/2017 14:42        Scheduled Meds: . aspirin EC  81 mg Oral Daily  . enoxaparin (LOVENOX) injection  60 mg Subcutaneous Q24H  . folic acid  1 mg Oral Daily  . furosemide  40 mg Intravenous Q12H  . hydrALAZINE  25 mg Oral Q8H  . multivitamin with minerals  1 tablet Oral Daily  . potassium chloride  20 mEq Oral BID  . sodium chloride flush  3 mL Intravenous Q12H  . thiamine  100 mg Oral Daily   Or  . thiamine  100 mg Intravenous Daily   Continuous Infusions: . sodium chloride       LOS: 1 day    Time spent:    Zannie Cove, MD [facility] Pager  [phone]  If 7PM-7AM, please contact night-coverage [email or web address] Password [email or web address] 02/11/2017, 11:09 AM

## 2017-02-11 NOTE — Progress Notes (Signed)
Patient's family had concerns about the way the patient was breathing while sleeping, they felt like "he may be having a hard time". Checked on patient, patient on oxygen via nasal cannula, O2 sats 100%, patient says he's breathing fine and feels ok. Nurse will continue to monitor.

## 2017-02-11 NOTE — Progress Notes (Signed)
Patient rested well overnight. No complaints of pain. No respiratory issues reported. Patient's Bp 110/70. Patient in bed resting.

## 2017-02-11 NOTE — Progress Notes (Signed)
  Echocardiogram 2D Echocardiogram has been performed.  Delcie Roch 02/11/2017, 1:32 PM

## 2017-02-11 NOTE — Progress Notes (Signed)
Pt is alert and oriented with no distress or complaints. Urine output is dark amber. Plan to Iv diurese again tonight.

## 2017-02-12 ENCOUNTER — Encounter (HOSPITAL_COMMUNITY)
Admission: EM | Disposition: A | Discharge status: Home / Self Care | Payer: Self-pay | Source: Home / Self Care | Attending: Internal Medicine

## 2017-02-12 ENCOUNTER — Encounter (HOSPITAL_COMMUNITY): Payer: Self-pay | Admitting: Physician Assistant

## 2017-02-12 DIAGNOSIS — Z6841 Body Mass Index (BMI) 40.0 and over, adult: Secondary | ICD-10-CM

## 2017-02-12 DIAGNOSIS — Z720 Tobacco use: Secondary | ICD-10-CM

## 2017-02-12 DIAGNOSIS — I5043 Acute on chronic combined systolic (congestive) and diastolic (congestive) heart failure: Secondary | ICD-10-CM

## 2017-02-12 DIAGNOSIS — I2510 Atherosclerotic heart disease of native coronary artery without angina pectoris: Secondary | ICD-10-CM

## 2017-02-12 HISTORY — PX: RIGHT/LEFT HEART CATH AND CORONARY ANGIOGRAPHY: CATH118266

## 2017-02-12 LAB — POCT I-STAT 3, VENOUS BLOOD GAS (G3P V)
ACID-BASE EXCESS: 4 mmol/L — AB (ref 0.0–2.0)
BICARBONATE: 29.8 mmol/L — AB (ref 20.0–28.0)
O2 SAT: 66 %
TCO2: 31 mmol/L (ref 0–100)
pCO2, Ven: 48.2 mmHg (ref 44.0–60.0)
pH, Ven: 7.4 (ref 7.250–7.430)
pO2, Ven: 35 mmHg (ref 32.0–45.0)

## 2017-02-12 LAB — BASIC METABOLIC PANEL
Anion gap: 13 (ref 5–15)
BUN: 14 mg/dL (ref 6–20)
CO2: 28 mmol/L (ref 22–32)
Calcium: 9 mg/dL (ref 8.9–10.3)
Chloride: 100 mmol/L — ABNORMAL LOW (ref 101–111)
Creatinine, Ser: 1.12 mg/dL (ref 0.61–1.24)
GFR calc Af Amer: >60 mL/min (ref >60)
Glucose, Bld: 100 mg/dL — ABNORMAL HIGH (ref 65–99)
POTASSIUM: 3.4 mmol/L — AB (ref 3.5–5.1)
SODIUM: 141 mmol/L (ref 135–145)

## 2017-02-12 LAB — CBC
HEMATOCRIT: 43.9 % (ref 39.0–52.0)
HEMOGLOBIN: 14.4 g/dL (ref 13.0–17.0)
MCH: 33.2 pg (ref 26.0–34.0)
MCHC: 32.8 g/dL (ref 30.0–36.0)
MCV: 101.2 fL — ABNORMAL HIGH (ref 78.0–100.0)
Platelets: 236 10*3/uL (ref 150–400)
RBC: 4.34 MIL/uL (ref 4.22–5.81)
RDW: 12.7 % (ref 11.5–15.5)
WBC: 10.4 10*3/uL (ref 4.0–10.5)

## 2017-02-12 LAB — POCT I-STAT 3, ART BLOOD GAS (G3+)
Acid-Base Excess: 4 mmol/L — ABNORMAL HIGH (ref 0.0–2.0)
Bicarbonate: 28.8 mmol/L — ABNORMAL HIGH (ref 20.0–28.0)
O2 Saturation: 93 %
PH ART: 7.417 (ref 7.350–7.450)
TCO2: 30 mmol/L (ref 0–100)
pCO2 arterial: 44.7 mmHg (ref 32.0–48.0)
pO2, Arterial: 67 mmHg — ABNORMAL LOW (ref 83.0–108.0)

## 2017-02-12 LAB — HEMOGLOBIN A1C
HEMOGLOBIN A1C: 4.8 % (ref 4.8–5.6)
MEAN PLASMA GLUCOSE: 91 mg/dL

## 2017-02-12 LAB — CREATININE, SERUM
CREATININE: 1.09 mg/dL (ref 0.61–1.24)
GFR calc Af Amer: >60 mL/min (ref >60)

## 2017-02-12 SURGERY — RIGHT/LEFT HEART CATH AND CORONARY ANGIOGRAPHY
Anesthesia: LOCAL

## 2017-02-12 MED ORDER — OXYCODONE-ACETAMINOPHEN 5-325 MG PO TABS: 1.0000 | ORAL_TABLET | ORAL | Status: DC | PRN

## 2017-02-12 MED ORDER — VERAPAMIL HCL 2.5 MG/ML IV SOLN
INTRAVENOUS | Status: DC | PRN
  Administered 2017-02-12: 10 mL via INTRA_ARTERIAL

## 2017-02-12 MED ORDER — POTASSIUM CHLORIDE CRYS ER 20 MEQ PO TBCR
40.0000 meq | EXTENDED_RELEASE_TABLET | Freq: Once | ORAL | Status: AC
  Administered 2017-02-12: 40 meq via ORAL
  Filled 2017-02-12: qty 2

## 2017-02-12 MED ORDER — FENTANYL CITRATE (PF) 100 MCG/2ML IJ SOLN
INTRAMUSCULAR | Status: DC | PRN
  Administered 2017-02-12: 50 ug via INTRAVENOUS

## 2017-02-12 MED ORDER — SODIUM CHLORIDE 0.9% FLUSH: 3.0000 mL | Freq: Two times a day (BID) | INTRAVENOUS | Status: DC

## 2017-02-12 MED ORDER — LIDOCAINE HCL (PF) 1 % IJ SOLN
INTRAMUSCULAR | Status: DC | PRN
  Administered 2017-02-12 (×2): 2 mL via SUBCUTANEOUS

## 2017-02-12 MED ORDER — ASPIRIN 81 MG PO CHEW: 81.0000 mg | CHEWABLE_TABLET | ORAL | Status: DC

## 2017-02-12 MED ORDER — HEPARIN (PORCINE) IN NACL 2-0.9 UNIT/ML-% IJ SOLN
INTRAMUSCULAR | Status: DC | PRN
  Administered 2017-02-12: 1000 mL

## 2017-02-12 MED ORDER — LIDOCAINE HCL 1 % IJ SOLN
INTRAMUSCULAR | Status: AC
  Filled 2017-02-12: qty 20

## 2017-02-12 MED ORDER — ASPIRIN 81 MG PO CHEW: 81.0000 mg | CHEWABLE_TABLET | Freq: Every day | ORAL | Status: DC

## 2017-02-12 MED ORDER — HEPARIN SODIUM (PORCINE) 1000 UNIT/ML IJ SOLN
INTRAMUSCULAR | Status: AC
  Filled 2017-02-12: qty 1

## 2017-02-12 MED ORDER — SODIUM CHLORIDE 0.9% FLUSH
3.0000 mL | INTRAVENOUS | Status: DC | PRN
Start: 2017-02-12 — End: 2017-02-14

## 2017-02-12 MED ORDER — ONDANSETRON HCL 4 MG/2ML IJ SOLN: 4.0000 mg | Freq: Four times a day (QID) | INTRAMUSCULAR | Status: DC | PRN

## 2017-02-12 MED ORDER — IOPAMIDOL (ISOVUE-370) INJECTION 76%
INTRAVENOUS | Status: AC
  Filled 2017-02-12: qty 100

## 2017-02-12 MED ORDER — ENOXAPARIN SODIUM 30 MG/0.3ML ~~LOC~~ SOLN: 30.0000 mg | SUBCUTANEOUS | Status: DC

## 2017-02-12 MED ORDER — LISINOPRIL 5 MG PO TABS
5.0000 mg | ORAL_TABLET | Freq: Every day | ORAL | Status: DC
  Administered 2017-02-12 – 2017-02-14 (×3): 5 mg via ORAL
  Filled 2017-02-12 (×3): qty 1

## 2017-02-12 MED ORDER — ACETAMINOPHEN 325 MG PO TABS: 650.0000 mg | ORAL_TABLET | ORAL | Status: DC | PRN

## 2017-02-12 MED ORDER — HEPARIN (PORCINE) IN NACL 2-0.9 UNIT/ML-% IJ SOLN
INTRAMUSCULAR | Status: AC
  Filled 2017-02-12: qty 1000

## 2017-02-12 MED ORDER — SODIUM CHLORIDE 0.9% FLUSH: 3.0000 mL | INTRAVENOUS | Status: DC | PRN

## 2017-02-12 MED ORDER — VERAPAMIL HCL 2.5 MG/ML IV SOLN
INTRAVENOUS | Status: AC
  Filled 2017-02-12: qty 2

## 2017-02-12 MED ORDER — HEPARIN SODIUM (PORCINE) 1000 UNIT/ML IJ SOLN
INTRAMUSCULAR | Status: DC | PRN
  Administered 2017-02-12: 6000 [IU] via INTRAVENOUS

## 2017-02-12 MED ORDER — MIDAZOLAM HCL 2 MG/2ML IJ SOLN
INTRAMUSCULAR | Status: DC | PRN
  Administered 2017-02-12: 1 mg via INTRAVENOUS

## 2017-02-12 MED ORDER — FENTANYL CITRATE (PF) 100 MCG/2ML IJ SOLN
INTRAMUSCULAR | Status: AC
  Filled 2017-02-12: qty 2

## 2017-02-12 MED ORDER — MIDAZOLAM HCL 2 MG/2ML IJ SOLN
INTRAMUSCULAR | Status: AC
  Filled 2017-02-12: qty 2

## 2017-02-12 MED ORDER — IOPAMIDOL (ISOVUE-370) INJECTION 76%
INTRAVENOUS | Status: DC | PRN
  Administered 2017-02-12: 100 mL via INTRA_ARTERIAL

## 2017-02-12 MED ORDER — SODIUM CHLORIDE 0.9 % IV SOLN: 250.0000 mL | INTRAVENOUS | Status: DC | PRN

## 2017-02-12 MED ORDER — SODIUM CHLORIDE 0.9 % IV SOLN: INTRAVENOUS | Status: DC

## 2017-02-12 MED ORDER — SODIUM CHLORIDE 0.9% FLUSH
3.0000 mL | Freq: Two times a day (BID) | INTRAVENOUS | Status: DC
  Administered 2017-02-12 – 2017-02-13 (×3): 3 mL via INTRAVENOUS

## 2017-02-12 MED ORDER — SODIUM CHLORIDE 0.9 % IV SOLN
INTRAVENOUS | Status: AC
  Administered 2017-02-12: 14:00:00 via INTRAVENOUS

## 2017-02-12 SURGICAL SUPPLY — 18 items
CATH BALLN WEDGE 5F 110CM (CATHETERS) ×2 IMPLANT
CATH INFINITI 5 FR JL3.5 (CATHETERS) ×2 IMPLANT
CATH INFINITI 5FR AL1 (CATHETERS) ×2 IMPLANT
CATH INFINITI JR4 5F (CATHETERS) ×2 IMPLANT
CATH OPTITORQUE TIG 4.0 5F (CATHETERS) ×2 IMPLANT
COVER PRB 48X5XTLSCP FOLD TPE (BAG) ×1 IMPLANT
COVER PROBE 5X48 (BAG) ×1
DEVICE RAD COMP TR BAND LRG (VASCULAR PRODUCTS) ×2 IMPLANT
GLIDESHEATH SLEND A-KIT 6F 22G (SHEATH) ×2 IMPLANT
GUIDEWIRE .025 260CM (WIRE) ×2 IMPLANT
GUIDEWIRE INQWIRE 1.5J.035X260 (WIRE) ×1 IMPLANT
HOVERMATT SINGLE USE (MISCELLANEOUS) ×2 IMPLANT
INQWIRE 1.5J .035X260CM (WIRE) ×2
KIT HEART LEFT (KITS) ×2 IMPLANT
PACK CARDIAC CATHETERIZATION (CUSTOM PROCEDURE TRAY) ×2 IMPLANT
SHEATH GLIDE SLENDER 4/5FR (SHEATH) ×2 IMPLANT
TRANSDUCER W/STOPCOCK (MISCELLANEOUS) ×2 IMPLANT
TUBING CIL FLEX 10 FLL-RA (TUBING) ×2 IMPLANT

## 2017-02-12 NOTE — Progress Notes (Signed)
Bryan from cath lab called stated they are sending transport for the pt. Informed Judie Grieve that pt had not been prepped. Judie Grieve stated that was okay the only thing needed was an IV in the right [facility]. Morrie Sheldon, RN inserted IV. Consent signed and in chart.  Transport at bedside 12:18  Karlynn Furrow

## 2017-02-12 NOTE — Consult Note (Signed)
CARDIOLOGY CONSULT NOTE   Patient ID: Nicholas Kelly. MRN: [id number] DOB/AGE: [id number] y.o.  Admit date: 02/10/2017  Primary Physician   No PCP Per Patient Primary Cardiologist   New to Dr. Tresa Endo Reason for Consultation   CHF Requesting Physician  Dr. Jomarie Longs  HPI: Nicholas Kelly. is a 51 y.o. male who is being seen today for the evaluation of acute CHF at the request of Dr. Jomarie Longs. The patient has hx of HTN, presume CHF, Former smoker and non compliance.   No regular follow-up with PCP. Seen in ER 07/2016 for edema. He was discharged on hydrochlorothiazide. He says he stopped smoking about one month ago. He smoked half a pack a day for 30 years. Drinks  2-3 liquors/ 4 days/ week. Denies marijuana or cocaine abuse. Prior cardiac history. Mother has a history of ICD placement.  He is transferred from urgent care 02/10/17 for evaluation of worsening shortness of breath x 1 week. He was ran out of  HCTZ around that time. His dyspnea mostly exertional that relieved with rest. He also has lower extremity edema and intermittent orthopnea. He denies PND, syncope, dizziness, melena, palpitation or blood in his stool or urine.  Denies any chest pain.  Workup revealed BNP 752. Point-of-care troponin negative. LDL 104.  Normal creatinine. Chest x-ray shows volume overload. Patient was admitted and started on IV Lasix with net diuresis of 7.1 L. Blood pressure intermittently  elevated.  EKG shows sinus rhythm with LVH inversions in lateral leads. No prior EKG to compare. Echocardiogram shows left ventricular function of 20-25%, moderate concentric hypertrophy, diffuse hypokinesis, grade 3 diastolic dysfunction, mild mitral regurgitation, and to moderate tricuspid regurgitation, PA Pressure of 51 mmHg. Cardiology  is asked for further management.  Past Medical History:  Diagnosis Date  . Acute CHF (congestive heart failure) (HCC) 02/10/2017  . Dyspnea   . Hypertension      Past  Surgical History:  Procedure Laterality Date  . NO PAST SURGERIES      No Known Allergies  I have reviewed the patient's current medications . aspirin EC  81 mg Oral Daily  . enoxaparin (LOVENOX) injection  60 mg Subcutaneous Q24H  . folic acid  1 mg Oral Daily  . furosemide  40 mg Intravenous Q12H  . lisinopril  5 mg Oral Daily  . multivitamin with minerals  1 tablet Oral Daily  . potassium chloride  20 mEq Oral BID  . sodium chloride flush  3 mL Intravenous Q12H  . thiamine  100 mg Oral Daily   Or  . thiamine  100 mg Intravenous Daily   . sodium chloride     sodium chloride, acetaminophen, hydrALAZINE, LORazepam **OR** LORazepam, ondansetron (ZOFRAN) IV, sodium chloride flush  Prior to Admission medications   Medication Sig Start Date End Date Taking? Authorizing Provider  hydrochlorothiazide (HYDRODIURIL) 25 MG tablet Take 1 tablet (25 mg total) by mouth daily. 07/28/16  Yes Tharon Aquas, PA  ibuprofen (ADVIL,MOTRIN) 200 MG tablet Take 200 mg by mouth every 6 (six) hours as needed for moderate pain.   Yes Historical Provider, MD     Social History   Social History  . Marital status: Divorced    Spouse name: N/A  . Number of children: N/A  . Years of education: N/A   Occupational History  . Not on file.   Social History Main Topics  . Smoking status: Former Smoker    Packs/day: 1.00    Years:  34.00    Types: Cigarettes    Quit date: 06/28/2016  . Smokeless tobacco: Never Used  . Alcohol use 15.6 oz/week    18 Cans of beer, 8 Shots of liquor per week  . Drug use: Yes     Comment: 02/10/2017 "experimented w/drugs; haven't done any for years"  . Sexual activity: Not Currently   Other Topics Concern  . Not on file   Social History Narrative  . No narrative on file    Family Status  Relation Status  . Mother    Family History  Problem Relation Age of Onset  . Hypertension Mother   . Cardiomyopathy Mother       ROS:  Full 14 point review of systems  complete and found to be negative unless listed above.  Physical Exam: Blood pressure (!) 145/95, pulse 80, temperature 98 F (36.7 C), temperature source Oral, resp. rate 17, height 5\' 8"  (1.727 m), weight 292 lb 4.8 oz (132.6 kg), SpO2 98 %.  General: Well developed, well nourished, male in no acute distress Head: Eyes PERRLA, No xanthomas. Normocephalic and atraumatic, oropharynx without edema or exudate.  Lungs: Resp regular and unlabored, CTA. Heart: RRR no s3, s4, or murmurs..   Neck: No carotid bruits. No lymphadenopathy. No  JVD. Abdomen: Bowel sounds present, abdomen soft and non-tender without masses or hernias noted. Msk:  No spine or cva tenderness. No weakness, no joint deformities or effusions. Extremities: No clubbing, cyanosis. 1 + BL LE  edema. DP/PT/Radials 2+ and equal bilaterally. Neuro: Alert and oriented X 3. No focal deficits noted. Psych:  Good affect, responds appropriately Skin: No rashes or lesions noted.  Labs:   Lab Results  Component Value Date   WBC 9.5 02/10/2017   HGB 13.8 02/10/2017   HCT 42.9 02/10/2017   MCV 100.9 (H) 02/10/2017   PLT 222 02/10/2017   No results for input(s): INR in the last 72 hours.   Recent Labs Lab 02/12/17 0339  NA 141  K 3.4*  CL 100*  CO2 28  BUN 14  CREATININE 1.12  CALCIUM 9.0  GLUCOSE 100*   No results found for: MG No results for input(s): CKTOTAL, CKMB, TROPONINI in the last 72 hours.  Recent Labs  02/10/17 1545  TROPIPOC 0.04   No results found for: PROBNP Lab Results  Component Value Date   CHOL 153 02/11/2017   HDL 34 (L) 02/11/2017   LDLCALC 104 (H) 02/11/2017   TRIG 74 02/11/2017   No results found for: DDIMER No results found for: LIPASE, AMYLASE No results found for: TSH, T4TOTAL, T3FREE, THYROIDAB No results found for: VITAMINB12, FOLATE, FERRITIN, TIBC, IRON, RETICCTPCT  Echo: 02/11/17 Study Conclusions  - Left ventricle: The cavity size was moderately dilated. There was    moderate concentric hypertrophy. Systolic function was severely   reduced. The estimated ejection fraction was in the range of 20%   to 25%. Severe diffuse hypokinesis. Although no diagnostic   regional wall motion abnormality was identified, this possibility   cannot be completely excluded on the basis of this study. Doppler   parameters are consistent with a reversible restrictive pattern,   indicative of decreased left ventricular diastolic compliance   and/or increased left atrial pressure (grade 3 diastolic   dysfunction). - Mitral valve: There was mild regurgitation. - Left atrium: The atrium was severely dilated. - Right ventricle: The cavity size was mildly dilated. Systolic   function was mildly reduced. - Tricuspid valve: There was mild-moderate  regurgitation. - Pulmonary arteries: Systolic pressure was moderately increased.   PA peak pressure: 51 mm Hg (S).   Radiology:  Dg Chest 2 View  Result Date: 02/10/2017 CLINICAL DATA:  Shortness of breath and cough for 2 days. Patient reports being unable to take his antihypertensive medication for the past 4 days. Former smoker. EXAM: CHEST  2 VIEW COMPARISON:  Chest x-ray of July 28, 2016 FINDINGS: The lungs are adequately inflated. There is no focal infiltrate. The interstitial markings are coarse The right hilar structures are prominent. The pulmonary vascularity is not engorged. The cardiac silhouette is enlarged but stable. There is calcification in the wall of the thoracic aorta. There is no pleural effusion. The bony thorax exhibits no acute abnormality. IMPRESSION: Chronic bronchitic changes with superimposed low-grade CHF with mild interstitial edema. There is no alveolar pneumonia. Thoracic aortic atherosclerosis. Electronically Signed   By: David  [place] M.D.   On: 02/10/2017 14:42    ASSESSMENT AND PLAN:  1. Acute combined systolic and diastolic CHG - Echocardiogram shows left ventricular function of 20-25%, moderate  concentric hypertrophy, diffuse hypokinesis, grade 3 diastolic dysfunction, mild mitral regurgitation, and to moderate tricuspid regurgitation, PA Pressure of 51 mmHg. - BNP 752. Chest x-ray shows volume overload. Net diuresis of 7.1 L on IV lasix 40mg  BID.Scr stable. - His cardiomyopathy could be to upper tension versus ischemic. Denies any chest pain. Troponin negative. EKG with T-wave inversion in lateral leads. No prior EKG to compare. -Will do  left and right heart cath today. - Continue Ace. Add beta blocker pending cath.   2. Uncontrolled HTN - While off HCTZ. BP controlled on current medications.  3. Tobacco abuse - He says he stopped smoking tobacco one month ago.   4. Hypokalemia - Supplement. Will give extra Kdur prior to cath for K of 3.4.    SignedManson Passey, PA 02/12/2017, 11:31 AM Pager [phone]  Co-Sign MD  Patient seen and examined. Agree with assessment and plan.  As per min is a 51 year old African-American male who admits to at least a 30 year history of hypertension as well as tobacco use.  He has a family history for disease and his mother had undergone prior ICD implantation prior to her death.  And states that in Aug 31, 2016.  He developed symptoms of increasing shortness of breath at that time sought medical attention and was placed on furosemide for 1 week and then resume taking his HCTZ.  He was currently admitted in transfer from urgent care on 02/10/2017 with a one-week history of progressive shortness of breath, and increased edema.  Initial BMP was 752.  Been treated with IV furosemide and has had a net diuresis of 7.1 L.  An echo Doppler study demonstrates an LV dilation with an EF of 20-25% with severe diffuse hypokinesis.  He had restrictive physiology with grade 3 diastolic dysfunction.  There was mild MR and severe LA dilation.  He had mild reduction of RV function.  There was moderate pulmonary hypertension with an estimated PA pressure at  51 mm. His exam is notable for morbid obesity, was anicteric.  EVD approximate 8 cm.  Mallampati skills at least 3.  Thick neck.  Increased breath sounds at his bases without wheezing.  Rhythm was regular 6 systolic murmur.  Any diastolic murmur.  Moderate central adiposity.  Pulses are 2+ without bruits.  His lower extremity edema has significantly improved and was now trace to 1+.  Neurologically exam is nonfocal.  DG independently  reviewed by me shows sinus rhythm at 89 bpm.  He has LVH by voltage criteria in aVL with lateral T-wave changes which may be due to repolarization changes from his LVH.  QTC is mildly prolonged.  Recommended definitive right.  Left heart cardiac catheterization in this 51 year old male with a 30 year history of hypertension, recent documentation of significant cardiomyopathy with an ejection fraction of a 20- 25%.  I discussed the rational for exluding underlying CAD in the etiology of his cardiomyopathy. I have reviewed the risks, indications, and alternatives to cardiac catheterization, possible angioplasty, and stenting with the patient. Risks include but are not limited to bleeding, infection, vascular injury, stroke, myocardial infection, arrhythmia, kidney injury, radiation-related injury in the case of prolonged fluoroscopy use, emergency cardiac surgery, and death. The patient understands the risks of serious complication is 1-2 in 1000 with diagnostic cardiac cath and 1-2% or less with angioplasty/stenting.  Plan this afternoon.   Lennette Bihari, MD, [facility] 02/12/2017 11:50 AM

## 2017-02-12 NOTE — Progress Notes (Signed)
Paged MD regarding pt NPO, MD stated okay to give all medications. MD stated waiting for cardiology to round on pt for possible heart catheterization.   Nicholas Kelly

## 2017-02-12 NOTE — Care Management Note (Signed)
Case Management Note  Patient Details  Name: Nicholas Kelly. MRN: [id number] Date of Birth: 03-Oct-1966  Subjective/Objective:     Admitted with CHF              Action/Plan: Patient lives at home with his girlfriend; No PCP/ no medical insurance; pt to [facility] the [facility] at discharge. CM will continue to follow for DCP  Expected Discharge Date:      Possibly 02/14/2017            Expected Discharge Plan:  Home/Self Care  Discharge planning Services  CM Consult  Status of Service:  In process, will continue to follow  Reola Mosher [phone] 02/12/2017, 9:58 AM

## 2017-02-12 NOTE — Interval H&P Note (Signed)
Cath Lab Visit (complete for each Cath Lab visit)  Clinical Evaluation Leading to the Procedure:   ACS: No.  Non-ACS:    Anginal Classification: CCS III  Anti-ischemic medical therapy: No Therapy  Non-Invasive Test Results: No non-invasive testing performed  Prior CABG: No previous CABG      History and Physical Interval Note:  02/12/2017 1:04 PM  [name]  has presented today for surgery, with the diagnosis of active chf  The various methods of treatment have been discussed with the patient and family. After consideration of risks, benefits and other options for treatment, the patient has consented to  Procedure(s): Right/Left Heart Cath and Coronary Angiography (N/A) as a surgical intervention .  The patient's history has been reviewed, patient examined, no change in status, stable for surgery.  I have reviewed the patient's chart and labs.  Questions were answered to the patient's satisfaction.     Lyn Records III

## 2017-02-12 NOTE — H&P (View-Only) (Signed)
CARDIOLOGY CONSULT NOTE   Patient ID: Nicholas Kelly. MRN: [id number] DOB/AGE: [id number] y.o.  Admit date: 02/10/2017  Primary Physician   No PCP Per Patient Primary Cardiologist   New to Dr. Tresa Endo Reason for Consultation   CHF Requesting Physician  Dr. Jomarie Longs  HPI: Nicholas Kelly. is a 51 y.o. male who is being seen today for the evaluation of acute CHF at the request of Dr. Jomarie Longs. The patient has hx of HTN, presume CHF, Former smoker and non compliance.   No regular follow-up with PCP. Seen in ER 07/2016 for edema. He was discharged on hydrochlorothiazide. He says he stopped smoking about one month ago. He smoked half a pack a day for 30 years. Drinks  2-3 liquors/ 4 days/ week. Denies marijuana or cocaine abuse. Prior cardiac history. Mother has a history of ICD placement.  He is transferred from urgent care 02/10/17 for evaluation of worsening shortness of breath x 1 week. He was ran out of  HCTZ around that time. His dyspnea mostly exertional that relieved with rest. He also has lower extremity edema and intermittent orthopnea. He denies PND, syncope, dizziness, melena, palpitation or blood in his stool or urine.  Denies any chest pain.  Workup revealed BNP 752. Point-of-care troponin negative. LDL 104.  Normal creatinine. Chest x-ray shows volume overload. Patient was admitted and started on IV Lasix with net diuresis of 7.1 L. Blood pressure intermittently  elevated.  EKG shows sinus rhythm with LVH inversions in lateral leads. No prior EKG to compare. Echocardiogram shows left ventricular function of 20-25%, moderate concentric hypertrophy, diffuse hypokinesis, grade 3 diastolic dysfunction, mild mitral regurgitation, and to moderate tricuspid regurgitation, PA Pressure of 51 mmHg. Cardiology  is asked for further management.  Past Medical History:  Diagnosis Date  . Acute CHF (congestive heart failure) (HCC) 02/10/2017  . Dyspnea   . Hypertension      Past  Surgical History:  Procedure Laterality Date  . NO PAST SURGERIES      No Known Allergies  I have reviewed the patient's current medications . aspirin EC  81 mg Oral Daily  . enoxaparin (LOVENOX) injection  60 mg Subcutaneous Q24H  . folic acid  1 mg Oral Daily  . furosemide  40 mg Intravenous Q12H  . lisinopril  5 mg Oral Daily  . multivitamin with minerals  1 tablet Oral Daily  . potassium chloride  20 mEq Oral BID  . sodium chloride flush  3 mL Intravenous Q12H  . thiamine  100 mg Oral Daily   Or  . thiamine  100 mg Intravenous Daily   . sodium chloride     sodium chloride, acetaminophen, hydrALAZINE, LORazepam **OR** LORazepam, ondansetron (ZOFRAN) IV, sodium chloride flush  Prior to Admission medications   Medication Sig Start Date End Date Taking? Authorizing Provider  hydrochlorothiazide (HYDRODIURIL) 25 MG tablet Take 1 tablet (25 mg total) by mouth daily. 07/28/16  Yes Tharon Aquas, PA  ibuprofen (ADVIL,MOTRIN) 200 MG tablet Take 200 mg by mouth every 6 (six) hours as needed for moderate pain.   Yes Historical Provider, MD     Social History   Social History  . Marital status: Divorced    Spouse name: N/A  . Number of children: N/A  . Years of education: N/A   Occupational History  . Not on file.   Social History Main Topics  . Smoking status: Former Smoker    Packs/day: 1.00    Years:  34.00    Types: Cigarettes    Quit date: 06/28/2016  . Smokeless tobacco: Never Used  . Alcohol use 15.6 oz/week    18 Cans of beer, 8 Shots of liquor per week  . Drug use: Yes     Comment: 02/10/2017 "experimented w/drugs; haven't done any for years"  . Sexual activity: Not Currently   Other Topics Concern  . Not on file   Social History Narrative  . No narrative on file    Family Status  Relation Status  . Mother    Family History  Problem Relation Age of Onset  . Hypertension Mother   . Cardiomyopathy Mother       ROS:  Full 14 point review of systems  complete and found to be negative unless listed above.  Physical Exam: Blood pressure (!) 145/95, pulse 80, temperature 98 F (36.7 C), temperature source Oral, resp. rate 17, height 5\' 8"  (1.727 m), weight 292 lb 4.8 oz (132.6 kg), SpO2 98 %.  General: Well developed, well nourished, male in no acute distress Head: Eyes PERRLA, No xanthomas. Normocephalic and atraumatic, oropharynx without edema or exudate.  Lungs: Resp regular and unlabored, CTA. Heart: RRR no s3, s4, or murmurs..   Neck: No carotid bruits. No lymphadenopathy. No  JVD. Abdomen: Bowel sounds present, abdomen soft and non-tender without masses or hernias noted. Msk:  No spine or cva tenderness. No weakness, no joint deformities or effusions. Extremities: No clubbing, cyanosis. 1 + BL LE  edema. DP/PT/Radials 2+ and equal bilaterally. Neuro: Alert and oriented X 3. No focal deficits noted. Psych:  Good affect, responds appropriately Skin: No rashes or lesions noted.  Labs:   Lab Results  Component Value Date   WBC 9.5 02/10/2017   HGB 13.8 02/10/2017   HCT 42.9 02/10/2017   MCV 100.9 (H) 02/10/2017   PLT 222 02/10/2017   No results for input(s): INR in the last 72 hours.   Recent Labs Lab 02/12/17 0339  NA 141  K 3.4*  CL 100*  CO2 28  BUN 14  CREATININE 1.12  CALCIUM 9.0  GLUCOSE 100*   No results found for: MG No results for input(s): CKTOTAL, CKMB, TROPONINI in the last 72 hours.  Recent Labs  02/10/17 1545  TROPIPOC 0.04   No results found for: PROBNP Lab Results  Component Value Date   CHOL 153 02/11/2017   HDL 34 (L) 02/11/2017   LDLCALC 104 (H) 02/11/2017   TRIG 74 02/11/2017   No results found for: DDIMER No results found for: LIPASE, AMYLASE No results found for: TSH, T4TOTAL, T3FREE, THYROIDAB No results found for: VITAMINB12, FOLATE, FERRITIN, TIBC, IRON, RETICCTPCT  Echo: 02/11/17 Study Conclusions  - Left ventricle: The cavity size was moderately dilated. There was    moderate concentric hypertrophy. Systolic function was severely   reduced. The estimated ejection fraction was in the range of 20%   to 25%. Severe diffuse hypokinesis. Although no diagnostic   regional wall motion abnormality was identified, this possibility   cannot be completely excluded on the basis of this study. Doppler   parameters are consistent with a reversible restrictive pattern,   indicative of decreased left ventricular diastolic compliance   and/or increased left atrial pressure (grade 3 diastolic   dysfunction). - Mitral valve: There was mild regurgitation. - Left atrium: The atrium was severely dilated. - Right ventricle: The cavity size was mildly dilated. Systolic   function was mildly reduced. - Tricuspid valve: There was mild-moderate  regurgitation. - Pulmonary arteries: Systolic pressure was moderately increased.   PA peak pressure: 51 mm Hg (S).   Radiology:  Dg Chest 2 View  Result Date: 02/10/2017 CLINICAL DATA:  Shortness of breath and cough for 2 days. Patient reports being unable to take his antihypertensive medication for the past 4 days. Former smoker. EXAM: CHEST  2 VIEW COMPARISON:  Chest x-ray of July 28, 2016 FINDINGS: The lungs are adequately inflated. There is no focal infiltrate. The interstitial markings are coarse The right hilar structures are prominent. The pulmonary vascularity is not engorged. The cardiac silhouette is enlarged but stable. There is calcification in the wall of the thoracic aorta. There is no pleural effusion. The bony thorax exhibits no acute abnormality. IMPRESSION: Chronic bronchitic changes with superimposed low-grade CHF with mild interstitial edema. There is no alveolar pneumonia. Thoracic aortic atherosclerosis. Electronically Signed   By: David  [place] M.D.   On: 02/10/2017 14:42    ASSESSMENT AND PLAN:  1. Acute combined systolic and diastolic CHG - Echocardiogram shows left ventricular function of 20-25%, moderate  concentric hypertrophy, diffuse hypokinesis, grade 3 diastolic dysfunction, mild mitral regurgitation, and to moderate tricuspid regurgitation, PA Pressure of 51 mmHg. - BNP 752. Chest x-ray shows volume overload. Net diuresis of 7.1 L on IV lasix 40mg  BID.Scr stable. - His cardiomyopathy could be to upper tension versus ischemic. Denies any chest pain. Troponin negative. EKG with T-wave inversion in lateral leads. No prior EKG to compare. -Will do  left and right heart cath today. - Continue Ace. Add beta blocker pending cath.   2. Uncontrolled HTN - While off HCTZ. BP controlled on current medications.  3. Tobacco abuse - He says he stopped smoking tobacco one month ago.   4. Hypokalemia - Supplement. Will give extra Kdur prior to cath for K of 3.4.    SignedManson Passey, PA 02/12/2017, 11:31 AM Pager [phone]  Co-Sign MD  Patient seen and examined. Agree with assessment and plan.  As per min is a 51 year old African-American male who admits to at least a 30 year history of hypertension as well as tobacco use.  He has a family history for disease and his mother had undergone prior ICD implantation prior to her death.  And states that in Aug 31, 2016.  He developed symptoms of increasing shortness of breath at that time sought medical attention and was placed on furosemide for 1 week and then resume taking his HCTZ.  He was currently admitted in transfer from urgent care on 02/10/2017 with a one-week history of progressive shortness of breath, and increased edema.  Initial BMP was 752.  Been treated with IV furosemide and has had a net diuresis of 7.1 L.  An echo Doppler study demonstrates an LV dilation with an EF of 20-25% with severe diffuse hypokinesis.  He had restrictive physiology with grade 3 diastolic dysfunction.  There was mild MR and severe LA dilation.  He had mild reduction of RV function.  There was moderate pulmonary hypertension with an estimated PA pressure at  51 mm. His exam is notable for morbid obesity, was anicteric.  EVD approximate 8 cm.  Mallampati skills at least 3.  Thick neck.  Increased breath sounds at his bases without wheezing.  Rhythm was regular 6 systolic murmur.  Any diastolic murmur.  Moderate central adiposity.  Pulses are 2+ without bruits.  His lower extremity edema has significantly improved and was now trace to 1+.  Neurologically exam is nonfocal.  DG independently  reviewed by me shows sinus rhythm at 89 bpm.  He has LVH by voltage criteria in aVL with lateral T-wave changes which may be due to repolarization changes from his LVH.  QTC is mildly prolonged.  Recommended definitive right.  Left heart cardiac catheterization in this 51 year old male with a 30 year history of hypertension, recent documentation of significant cardiomyopathy with an ejection fraction of a 20- 25%.  I discussed the rational for exluding underlying CAD in the etiology of his cardiomyopathy. I have reviewed the risks, indications, and alternatives to cardiac catheterization, possible angioplasty, and stenting with the patient. Risks include but are not limited to bleeding, infection, vascular injury, stroke, myocardial infection, arrhythmia, kidney injury, radiation-related injury in the case of prolonged fluoroscopy use, emergency cardiac surgery, and death. The patient understands the risks of serious complication is 1-2 in 1000 with diagnostic cardiac cath and 1-2% or less with angioplasty/stenting.  Plan this afternoon.   Lennette Bihari, MD, [facility] 02/12/2017 11:50 AM

## 2017-02-12 NOTE — Progress Notes (Signed)
Pt family at bedside, password set up.  Nicholas Kelly

## 2017-02-12 NOTE — Progress Notes (Signed)
PROGRESS NOTE    Nicholas Kelly.  WPV:[id number] DOB: [birth year] DOA: 02/10/2017 PCP: No PCP Per Patient  Brief Narrative:Nicholas Kelly  is a 51 y.o. obese male with history of hypertension, smoker who presented to the urgent care with acute onset of shortness of breath starting last night. Patient was diagnosed with congestive heart failure in October 2017 and discharged home to continue his HCTZ. Patient reports that he ran out of his HCTZ prescription few days back. Last time he had acute onset of shortness of breath and felt like a panic attack. Chest x-ray showed findings of CHF with pulmonary edema.  Assessment & Plan:  Acute Systolic and diastolic CHF -improving -ECHO with EF of 25% and grade 3 DD -Cards consult for ischemia evaluation -continue IV lasix 40mg  q12 with KCL -monitor I/Os, weights -negative 6.2L -CHF education/ RD consult    Hypertensive urgency -due to volume overload, improving with diuresis -hold hydralazine -add low dose ACE   Mild hyperglycemia -Hba1c 4.8    Obesity -lifestyle modification    Tobacco abuse -Reports being a heavy smoker in the past. Quit for the past 2 weeks.  Alcohol use -continue thiamine, Monitor on CIWA.  DVT Prophylaxis  Lovenox   FUll Code Family Communication:None at bedside DIspo: home pending cardiac workup  Procedures:   Subjective: Breathing improving  Objective: Vitals:   02/12/17 0700 02/12/17 0900 02/12/17 1243 02/12/17 1303  BP: (!) 132/97 (!) 145/95    Pulse:  80    Resp:  17    Temp:      TempSrc:      SpO2:  98% 97% 95%  Weight:      Height:        Intake/Output Summary (Last 24 hours) at 02/12/17 1330 Last data filed at 02/12/17 1200  Gross per 24 hour  Intake              820 ml  Output             3125 ml  Net            -2305 ml   Filed Weights   02/10/17 2026 02/11/17 0421 02/12/17 0430  Weight: 134.9 kg (297 lb 4.8 oz) 134.8 kg (297 lb 1.6 oz) 132.6 kg (292 lb 4.8  oz)    Examination:  General exam: Obese male,sitting in bed, no distress Respiratory system: basilar crackles improved Cardiovascular system: S1 & S2 heard, RRR. No JVD, murmurs Gastrointestinal system: Abdomen is nondistended, soft and nontender. . Normal bowel sounds heard. Central nervous system: Alert and oriented. No focal neurological deficits. Extremities: Symmetric 5 x 5 power, trace edema Skin: No rashes, lesions or ulcers Psychiatry: stable    Data Reviewed:   CBC:  Recent Labs Lab 02/10/17 1525  WBC 9.5  NEUTROABS 6.0  HGB 13.8  HCT 42.9  MCV 100.9*  PLT 222   Basic Metabolic Panel:  Recent Labs Lab 02/10/17 1525 02/11/17 0220 02/12/17 0339  NA 141 142 141  K 4.0 3.9 3.4*  CL 109 105 100*  CO2 22 29 28   GLUCOSE 103* 92 100*  BUN 14 13 14   CREATININE 0.93 1.05 1.12  CALCIUM 8.6* 8.8* 9.0   GFR: Estimated Creatinine Clearance: 102.7 mL/min (by C-G formula based on SCr of 1.12 mg/dL). Liver Function Tests: No results for input(s): AST, ALT, ALKPHOS, BILITOT, PROT, ALBUMIN in the last 168 hours. No results for input(s): LIPASE, AMYLASE in the last 168 hours. No results for input(s):  AMMONIA in the last 168 hours. Coagulation Profile: No results for input(s): INR, PROTIME in the last 168 hours. Cardiac Enzymes: No results for input(s): CKTOTAL, CKMB, CKMBINDEX, TROPONINI in the last 168 hours. BNP (last 3 results) No results for input(s): PROBNP in the last 8760 hours. HbA1C:  Recent Labs  02/11/17 0220  HGBA1C 4.8   CBG: No results for input(s): GLUCAP in the last 168 hours. Lipid Profile:  Recent Labs  02/11/17 0220  CHOL 153  HDL 34*  LDLCALC 104*  TRIG 74  CHOLHDL 4.5   Thyroid Function Tests: No results for input(s): TSH, T4TOTAL, FREET4, T3FREE, THYROIDAB in the last 72 hours. Anemia Panel: No results for input(s): VITAMINB12, FOLATE, FERRITIN, TIBC, IRON, RETICCTPCT in the last 72 hours. Urine analysis: No results found  for: COLORURINE, APPEARANCEUR, LABSPEC, PHURINE, GLUCOSEU, HGBUR, BILIRUBINUR, KETONESUR, PROTEINUR, UROBILINOGEN, NITRITE, LEUKOCYTESUR Sepsis Labs: @LABRCNTIP (procalcitonin:4,lacticidven:4)  )No results found for this or any previous visit (from the past 240 hour(s)).       Radiology Studies: Dg Chest 2 View  Result Date: 02/10/2017 CLINICAL DATA:  Shortness of breath and cough for 2 days. Patient reports being unable to take his antihypertensive medication for the past 4 days. Former smoker. EXAM: CHEST  2 VIEW COMPARISON:  Chest x-ray of July 28, 2016 FINDINGS: The lungs are adequately inflated. There is no focal infiltrate. The interstitial markings are coarse The right hilar structures are prominent. The pulmonary vascularity is not engorged. The cardiac silhouette is enlarged but stable. There is calcification in the wall of the thoracic aorta. There is no pleural effusion. The bony thorax exhibits no acute abnormality. IMPRESSION: Chronic bronchitic changes with superimposed low-grade CHF with mild interstitial edema. There is no alveolar pneumonia. Thoracic aortic atherosclerosis. Electronically Signed   By: David  [place] M.D.   On: 02/10/2017 14:42        Scheduled Meds: . aspirin  81 mg Oral Pre-Cath  . [MAR Hold] aspirin EC  81 mg Oral Daily  . [MAR Hold] enoxaparin (LOVENOX) injection  60 mg Subcutaneous Q24H  . [MAR Hold] folic acid  1 mg Oral Daily  . [MAR Hold] furosemide  40 mg Intravenous Q12H  . [MAR Hold] lisinopril  5 mg Oral Daily  . [MAR Hold] multivitamin with minerals  1 tablet Oral Daily  . [MAR Hold] potassium chloride  20 mEq Oral BID  . [MAR Hold] potassium chloride  40 mEq Oral Once  . [MAR Hold] sodium chloride flush  3 mL Intravenous Q12H  . sodium chloride flush  3 mL Intravenous Q12H  . [MAR Hold] thiamine  100 mg Oral Daily   Or  . [MAR Hold] thiamine  100 mg Intravenous Daily   Continuous Infusions: . [MAR Hold] sodium chloride    . sodium  chloride    . [START ON 02/13/2017] sodium chloride    . heparin       LOS: 2 days    Time spent:    Zannie Cove, MD [facility] Pager [phone]  If 7PM-7AM, please contact night-coverage [email or web address] Password [email or web address] 02/12/2017, 1:30 PM

## 2017-02-12 NOTE — Progress Notes (Signed)
Nutrition Brief Note  RD consulted for diet education regarding CHF education. Pt unavailable in Cath Lab for Right/Left Heart Cath and Coronary Angiography. Handout ,"Low Sodium Nutrition Therapy" from the Academy of Nutrition and Dietetics Manual, was placed at bedside table in patient room. RD contact information given. RD to follow up if questions arise.   Roslyn Smiling, MS, RD, LDN Pager # [phone] After hours/ weekend pager # [phone]

## 2017-02-13 DIAGNOSIS — I5021 Acute systolic (congestive) heart failure: Secondary | ICD-10-CM

## 2017-02-13 LAB — BASIC METABOLIC PANEL WITH GFR
Anion gap: 10 (ref 5–15)
BUN: 14 mg/dL (ref 6–20)
CO2: 27 mmol/L (ref 22–32)
Calcium: 8.8 mg/dL — ABNORMAL LOW (ref 8.9–10.3)
Chloride: 103 mmol/L (ref 101–111)
Creatinine, Ser: 1.01 mg/dL (ref 0.61–1.24)
GFR calc Af Amer: >60 mL/min
GFR calc non Af Amer: >60 mL/min
Glucose, Bld: 91 mg/dL (ref 65–99)
Potassium: 4.2 mmol/L (ref 3.5–5.1)
Sodium: 140 mmol/L (ref 135–145)

## 2017-02-13 MED ORDER — CARVEDILOL 6.25 MG PO TABS
6.2500 mg | ORAL_TABLET | Freq: Two times a day (BID) | ORAL | Status: DC
  Administered 2017-02-13 – 2017-02-14 (×3): 6.25 mg via ORAL
  Filled 2017-02-13 (×3): qty 1

## 2017-02-13 MED ORDER — ATORVASTATIN CALCIUM 40 MG PO TABS
40.0000 mg | ORAL_TABLET | Freq: Every day | ORAL | Status: DC
  Administered 2017-02-13: 40 mg via ORAL
  Filled 2017-02-13: qty 1

## 2017-02-13 MED ORDER — FUROSEMIDE 40 MG PO TABS
40.0000 mg | ORAL_TABLET | Freq: Every day | ORAL | Status: DC
Start: 2017-02-13 — End: 2017-02-14
  Administered 2017-02-13 – 2017-02-14 (×2): 40 mg via ORAL
  Filled 2017-02-13 (×2): qty 1

## 2017-02-13 MED ORDER — ENOXAPARIN SODIUM 80 MG/0.8ML ~~LOC~~ SOLN: 65.0000 mg | SUBCUTANEOUS | Status: DC

## 2017-02-13 NOTE — Progress Notes (Signed)
PROGRESS NOTE    [name]  LDJ:[id number] DOB: Jun 10, 1966 DOA: 02/10/2017 PCP: No PCP Per Patient  Brief Narrative:Nicholas Kelly  is a 51 y.o. obese male with history of hypertension, smoker who presented to the urgent care with acute onset of shortness of breath starting last night. Patient was diagnosed with congestive heart failure in October 2017 and discharged home to continue his HCTZ. Patient reports that he ran out of his HCTZ prescription few days back. Last time he had acute onset of shortness of breath and felt like a panic attack. Chest x-ray showed findings of CHF with pulmonary edema.  Assessment & Plan:  Acute Systolic and diastolic CHF -improving -ECHO with EF of 25% and grade 3 DD -Cards consulted, cath without non obstructive coronaries -diuresed with IV lasix -monitor I/Os, weights -negative 8.4, change lasix to PO -CHF education/ RD consult -add low dose core, ambulate, home tomorrow if stable    Hypertensive urgency -due to volume overload, improving with diuresis -continue ACE, add low dose coreg -stable now   Mild hyperglycemia -Hba1c 4.8    Obesity -lifestyle modification    Tobacco abuse -Reports being a heavy smoker in the past. Quit for the past 2 weeks.  Alcohol use -continue thiamine, Monitor on CIWA.  DVT Prophylaxis  Lovenox   FUll Code Family Communication:None at bedside DIspo: home tomorrow  Procedures:   Subjective: Breathing improved, ambulating well, no issues  Objective: Vitals:   02/12/17 2054 02/13/17 0015 02/13/17 0406 02/13/17 0900  BP: 140/83 (!) 137/95 (!) 119/95 129/76  Pulse: 88 68 (!) 110 86  Resp: 18 18 18 16   Temp: 98.5 F (36.9 C) 98.7 F (37.1 C) 98 F (36.7 C)   TempSrc: Oral Oral Oral   SpO2: 97% 97% 98% 100%  Weight:   130.8 kg (288 lb 4.8 oz)   Height:        Intake/Output Summary (Last 24 hours) at 02/13/17 1251 Last data filed at 02/13/17 1200  Gross per 24 hour  Intake            1027.5 ml  Output             2625 ml  Net          -1597.5 ml   Filed Weights   02/11/17 0421 02/12/17 0430 02/13/17 0406  Weight: 134.8 kg (297 lb 1.6 oz) 132.6 kg (292 lb 4.8 oz) 130.8 kg (288 lb 4.8 oz)    Examination:  General exam: Obese male,sitting in bed, pleasant Respiratory system: clear Cardiovascular system: S1 & S2 heard, RRR. No JVD, murmurs Gastrointestinal system: Abdomen is soft, non distended and nontender. . Normal bowel sounds heard. Central nervous system: Alert and oriented. No focal neurological deficits. Extremities: Symmetric 5 x 5 power, no edema Skin: No rashes, lesions or ulcers Psychiatry: stable    Data Reviewed:   CBC:  Recent Labs Lab 02/10/17 1525 02/12/17 1426  WBC 9.5 10.4  NEUTROABS 6.0  --   HGB 13.8 14.4  HCT 42.9 43.9  MCV 100.9* 101.2*  PLT 222 236   Basic Metabolic Panel:  Recent Labs Lab 02/10/17 1525 02/11/17 0220 02/12/17 0339 02/12/17 1426 02/13/17 0534  NA 141 142 141  --  140  K 4.0 3.9 3.4*  --  4.2  CL 109 105 100*  --  103  CO2 22 29 28   --  27  GLUCOSE 103* 92 100*  --  91  BUN 14 13 14   --  14  CREATININE 0.93 1.05 1.12 1.09 1.01  CALCIUM 8.6* 8.8* 9.0  --  8.8*   GFR: Estimated Creatinine Clearance: 113 mL/min (by C-G formula based on SCr of 1.01 mg/dL). Liver Function Tests: No results for input(s): AST, ALT, ALKPHOS, BILITOT, PROT, ALBUMIN in the last 168 hours. No results for input(s): LIPASE, AMYLASE in the last 168 hours. No results for input(s): AMMONIA in the last 168 hours. Coagulation Profile: No results for input(s): INR, PROTIME in the last 168 hours. Cardiac Enzymes: No results for input(s): CKTOTAL, CKMB, CKMBINDEX, TROPONINI in the last 168 hours. BNP (last 3 results) No results for input(s): PROBNP in the last 8760 hours. HbA1C:  Recent Labs  02/11/17 0220  HGBA1C 4.8   CBG: No results for input(s): GLUCAP in the last 168 hours. Lipid Profile:  Recent Labs   02/11/17 0220  CHOL 153  HDL 34*  LDLCALC 104*  TRIG 74  CHOLHDL 4.5   Thyroid Function Tests: No results for input(s): TSH, T4TOTAL, FREET4, T3FREE, THYROIDAB in the last 72 hours. Anemia Panel: No results for input(s): VITAMINB12, FOLATE, FERRITIN, TIBC, IRON, RETICCTPCT in the last 72 hours. Urine analysis: No results found for: COLORURINE, APPEARANCEUR, LABSPEC, PHURINE, GLUCOSEU, HGBUR, BILIRUBINUR, KETONESUR, PROTEINUR, UROBILINOGEN, NITRITE, LEUKOCYTESUR Sepsis Labs: @LABRCNTIP (procalcitonin:4,lacticidven:4)  )No results found for this or any previous visit (from the past 240 hour(s)).       Radiology Studies: No results found.      Scheduled Meds: . aspirin EC  81 mg Oral Daily  . atorvastatin  40 mg Oral q1800  . carvedilol  6.25 mg Oral BID WC  . enoxaparin (LOVENOX) injection  30 mg Subcutaneous Q24H  . folic acid  1 mg Oral Daily  . furosemide  40 mg Oral Daily  . lisinopril  5 mg Oral Daily  . multivitamin with minerals  1 tablet Oral Daily  . potassium chloride  20 mEq Oral BID  . sodium chloride flush  3 mL Intravenous Q12H  . sodium chloride flush  3 mL Intravenous Q12H  . thiamine  100 mg Oral Daily   Continuous Infusions: . sodium chloride    . sodium chloride       LOS: 3 days    Time spent:    Zannie Cove, MD [facility] Pager [phone]  If 7PM-7AM, please contact night-coverage [email or web address] Password [facility] 02/13/2017, 12:51 PM

## 2017-02-13 NOTE — Progress Notes (Addendum)
Progress Note  Patient Name: Nicholas Kelly. Date of Encounter: 02/13/2017  Primary Cardiologist: Dr. Tresa Endo  Subjective   No complaints today.  Denies any chest pain or SOB  Inpatient Medications    Scheduled Meds: . aspirin EC  81 mg Oral Daily  . carvedilol  6.25 mg Oral BID WC  . enoxaparin (LOVENOX) injection  30 mg Subcutaneous Q24H  . folic acid  1 mg Oral Daily  . furosemide  40 mg Oral Daily  . lisinopril  5 mg Oral Daily  . multivitamin with minerals  1 tablet Oral Daily  . potassium chloride  20 mEq Oral BID  . sodium chloride flush  3 mL Intravenous Q12H  . sodium chloride flush  3 mL Intravenous Q12H  . thiamine  100 mg Oral Daily   Or  . thiamine  100 mg Intravenous Daily   Continuous Infusions: . sodium chloride    . sodium chloride     PRN Meds: sodium chloride, sodium chloride, acetaminophen, hydrALAZINE, ondansetron (ZOFRAN) IV, oxyCODONE-acetaminophen, sodium chloride flush, sodium chloride flush   Vital Signs    Vitals:   02/12/17 2054 02/13/17 0015 02/13/17 0406 02/13/17 0900  BP: 140/83 (!) 137/95 (!) 119/95 129/76  Pulse: 88 68 (!) 110 86  Resp: 18 18 18 16   Temp: 98.5 F (36.9 C) 98.7 F (37.1 C) 98 F (36.7 C)   TempSrc: Oral Oral Oral   SpO2: 97% 97% 98% 100%  Weight:   288 lb 4.8 oz (130.8 kg)   Height:        Intake/Output Summary (Last 24 hours) at 02/13/17 0959 Last data filed at 02/13/17 0900  Gross per 24 hour  Intake           1027.5 ml  Output             3125 ml  Net          -2097.5 ml   Filed Weights   02/11/17 0421 02/12/17 0430 02/13/17 0406  Weight: 297 lb 1.6 oz (134.8 kg) 292 lb 4.8 oz (132.6 kg) 288 lb 4.8 oz (130.8 kg)    Telemetry    NSR - I have  Personally Reviewed  ECG    No new EKGs   Physical Exam   GEN: No acute distress.   Neck: No JVD Cardiac: RRR, no murmurs, rubs, or gallops.  Respiratory: Clear to auscultation bilaterally. GI: Soft, nontender, non-distended  MS: 1-2+ LE  edema; No deformity. Neuro:  Nonfocal  Psych: Normal affect   Labs    Chemistry Recent Labs Lab 02/11/17 0220 02/12/17 0339 02/12/17 1426 02/13/17 0534  NA 142 141  --  140  K 3.9 3.4*  --  4.2  CL 105 100*  --  103  CO2 29 28  --  27  GLUCOSE 92 100*  --  91  BUN 13 14  --  14  CREATININE 1.05 1.12 1.09 1.01  CALCIUM 8.8* 9.0  --  8.8*  GFRNONAA >60 >60 >60 >60  GFRAA >60 >60 >60 >60  ANIONGAP 8 13  --  10     Hematology Recent Labs Lab 02/10/17 1525 02/12/17 1426  WBC 9.5 10.4  RBC 4.25 4.34  HGB 13.8 14.4  HCT 42.9 43.9  MCV 100.9* 101.2*  MCH 32.5 33.2  MCHC 32.2 32.8  RDW 12.8 12.7  PLT 222 236    Cardiac EnzymesNo results for input(s): TROPONINI in the last 168 hours.  Recent Labs  Lab 02/10/17 1545  TROPIPOC 0.04     BNP Recent Labs Lab 02/10/17 1525  BNP 752.4*     DDimer No results for input(s): DDIMER in the last 168 hours.   Radiology    No results found.  Cardiac Studies    Right/Left Heart Cath and Coronary Angiography  Conclusion     Mid RCA to Dist RCA lesion, 50 %stenosed.  The left ventricular ejection fraction is less than 25% by visual estimate.  Hemodynamic findings consistent with moderate pulmonary hypertension.  LV end diastolic pressure is severely elevated.    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  Widely patent coronary arteries with diffuse 30-50% mid and distal RCA stenoses.  Moderate pulmonary hypertension  RECOMMENDATIONS:  Guideline mandated heart failure therapy    2D echo Study Conclusions  - Left ventricle: The cavity size was moderately dilated. There was   moderate concentric hypertrophy. Systolic function was severely   reduced. The estimated ejection fraction was in the range of 20%   to 25%. Severe diffuse hypokinesis. Although no diagnostic   regional wall motion abnormality was identified, this possibility    cannot be completely excluded on the basis of this study. Doppler   parameters are consistent with a reversible restrictive pattern,   indicative of decreased left ventricular diastolic compliance   and/or increased left atrial pressure (grade 3 diastolic   dysfunction). - Mitral valve: There was mild regurgitation. - Left atrium: The atrium was severely dilated. - Right ventricle: The cavity size was mildly dilated. Systolic   function was mildly reduced. - Tricuspid valve: There was mild-moderate regurgitation. - Pulmonary arteries: Systolic pressure was moderately increased.   PA peak pressure: 51 mm Hg (S).  Patient Profile     51 y.o. male with a history of tobacco use, ETOH use, HTN who presented with 1 week duration of SOB after running out of his HCTZ.  He has been having intermittent orthopnea and edema of LE.  BNP was elevated at 752 and normal POC trop.  Admitted with acute CHF with echo showing EF 20-25% with grade 3 DD and moderate pulmonary HTN.  Assessment & Plan    1. Acute combined systolic and diastolic CHF - Echocardiogram shows left ventricular function of 20-25%, moderate concentric hypertrophy, diffuse hypokinesis, grade 3 diastolic dysfunction, mild mitral regurgitation, and to moderate tricuspid regurgitation, PA Pressure of 51 mmHg. - BNP 752. Chest x-ray shows volume overload. He has put out 2.9L in last 24 hours and is net neg 8.4L on IV lasix 40mg  BID. Scr stable. - His cardiomyopathy is out of proportion to underlying CAD and likely due to hypertension.  - Continue Ace I, PO diuretics and carvedilol.  - creatinine stable at 1.01  2. Uncontrolled HTN - While off HCTZ. BP controlled on current medications. - continue BB, ACE I and diuretic.  3. Tobacco abuse - He says he stopped smoking tobacco one month ago.  4. Hypokalemia - repleted  - continue daily supplementation  5.  Non obstructive CAD with 50% mid to distal RCA.  He has no angina and trop  is negative.  - Continue on ASA - LDL was 104 - start Liptor 40mg  daily and will need repeat FLP and ALT in 6 weeks   Signed, Armanda Magic, MD  02/13/2017, 9:59 AM

## 2017-02-13 NOTE — Progress Notes (Signed)
TC from [facility] around 2117 had 3 beat run PVC's and then about 10-15 min. later a 5 beat run PVC's, and pt asymptomatic- watching TV; and Dr. Lonny Prude called and RTC- order given.

## 2017-02-14 DIAGNOSIS — I5041 Acute combined systolic (congestive) and diastolic (congestive) heart failure: Secondary | ICD-10-CM

## 2017-02-14 LAB — BASIC METABOLIC PANEL
ANION GAP: 10 (ref 5–15)
BUN: 18 mg/dL (ref 6–20)
CO2: 28 mmol/L (ref 22–32)
Calcium: 8.9 mg/dL (ref 8.9–10.3)
Chloride: 103 mmol/L (ref 101–111)
Creatinine, Ser: 1 mg/dL (ref 0.61–1.24)
Glucose, Bld: 97 mg/dL (ref 65–99)
Potassium: 4 mmol/L (ref 3.5–5.1)
SODIUM: 141 mmol/L (ref 135–145)

## 2017-02-14 LAB — HEPATIC FUNCTION PANEL
ALT: 28 U/L (ref 17–63)
AST: 28 U/L (ref 15–41)
Albumin: 3.4 g/dL — ABNORMAL LOW (ref 3.5–5.0)
Alkaline Phosphatase: 49 U/L (ref 38–126)
Bilirubin, Direct: 0.2 mg/dL (ref 0.1–0.5)
Indirect Bilirubin: 1.3 mg/dL — ABNORMAL HIGH (ref 0.3–0.9)
Total Bilirubin: 1.5 mg/dL — ABNORMAL HIGH (ref 0.3–1.2)
Total Protein: 7.2 g/dL (ref 6.5–8.1)

## 2017-02-14 LAB — MAGNESIUM: MAGNESIUM: 2.4 mg/dL (ref 1.7–2.4)

## 2017-02-14 MED ORDER — POTASSIUM CHLORIDE CRYS ER 20 MEQ PO TBCR: 20.0000 meq | EXTENDED_RELEASE_TABLET | Freq: Every day | ORAL | 0 refills | Status: DC

## 2017-02-14 MED ORDER — LISINOPRIL 5 MG PO TABS: 5.0000 mg | ORAL_TABLET | Freq: Every day | ORAL | 0 refills | Status: DC

## 2017-02-14 MED ORDER — ATORVASTATIN CALCIUM 40 MG PO TABS: 40.0000 mg | ORAL_TABLET | Freq: Every day | ORAL | 0 refills | Status: DC

## 2017-02-14 MED ORDER — CARVEDILOL 6.25 MG PO TABS: 6.2500 mg | ORAL_TABLET | Freq: Two times a day (BID) | ORAL | 0 refills | Status: DC

## 2017-02-14 MED ORDER — FUROSEMIDE 40 MG PO TABS: 40.0000 mg | ORAL_TABLET | Freq: Every day | ORAL | 0 refills | Status: DC

## 2017-02-14 MED ORDER — ASPIRIN 81 MG PO TBEC: 81.0000 mg | DELAYED_RELEASE_TABLET | Freq: Every day | ORAL | Status: DC

## 2017-02-14 NOTE — Plan of Care (Signed)
Problem: Education: Goal: Knowledge of [facility] Education information/materials will improve Outcome: Progressing POC reviewed with pt.   

## 2017-02-14 NOTE — Progress Notes (Signed)
Pt discharged home. IV and tele removed. Questions answered. Taken out via wheelchair.

## 2017-02-14 NOTE — Discharge Summary (Signed)
Physician Discharge Summary  Nicholas Kelly. YQI:[id number] DOB: [birth year] DOA: 02/10/2017  PCP: No PCP Per Patient  Admit date: 02/10/2017 Discharge date: 02/14/2017  Time spent: 45 minutes  Recommendations for Outpatient Follow-up:  1. PCP in 1 week 2. Cardiology Dr.Kelly in 2-3weeks, bmet in 1 week   Discharge Diagnoses:  Principal Problem:   Acute on chronic systolic and diastolic CHF   Hypertensive urgency   Obesity   Tobacco abuse   ETOH use   Discharge Condition: stable  Diet recommendation: low sodium heart healthy  Filed Weights   02/12/17 0430 02/13/17 0406 02/14/17 0541  Weight: 132.6 kg (292 lb 4.8 oz) 130.8 kg (288 lb 4.8 oz) 131 kg (288 lb 14.4 oz)    History of present illness:  CharlesParrimanis a 51 y.o.obese male with history of hypertension, smoker who presented to the urgent care with acute onset of shortness of breath starting last night. Patient was diagnosed with congestive heart failure in October 2017 and discharged home to continue his HCTZ. Patient reports that he ran out of his HCTZ prescription few days back. Last time he had acute onset of shortness of breath and felt like a panic attack. Chest x-ray showed findings of CHF with pulmonary edema  Hospital Course:   Acute Systolic and diastolic CHF -improving with diuresis -ECHO notable for EF of 20-25% and grade 3 DD -Cards consulted, cath without non obstructive coronaries, 30-50% mid and distal RCA stenoses -diuresed with IV lasix, negative 9.7L  -started on coreg, lisinopril and lasix changed to 40mg  daily -advised to FU with Cardiology Dr.Kelly in 2-3weeks  Hypertensive urgency -due to volume overload, improved with diuresis -now on coreg and lisinopril   Mild hyperglycemia -Hba1c 4.8, no DM  Obesity -lifestyle modification advised  Tobacco abuse -Reports being a heavy smoker in the past. Quit for the past 2 weeks. 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  Widely patent coronary arteries with diffuse 30-50% mid and distal RCA stenoses.  Moderate pulmonary hypertension   Consultations:  Cards  Discharge Exam: Vitals:   02/13/17 2110 02/14/17 0541  BP: 116/75 124/87  Pulse: 72 75  Resp: 16 20  Temp: 98.4 F (36.9 C) 98.6 F (37 C)    General: AAOx3 Cardiovascular: S1S2/RRR Respiratory: CTAB  Discharge Instructions   Discharge Instructions    Diet - low sodium heart healthy    Complete by:  As directed    Increase activity slowly    Complete by:  As directed      Discharge Medication List as of 02/14/2017 11:05 AM    START taking these medications   Details  aspirin EC 81 MG EC tablet Take 1 tablet (81 mg total) by mouth daily., Starting Mon 02/15/2017, OTC    atorvastatin (LIPITOR) 40 MG tablet Take 1 tablet (40 mg total) by mouth daily at 6 PM., Starting Sun 02/14/2017, Print    carvedilol (COREG) 6.25 MG tablet Take 1 tablet (6.25 mg total) by mouth 2 (two) times daily with a meal., Starting Sun 02/14/2017, Print    furosemide (LASIX) 40 MG tablet Take 1 tablet (40 mg total) by mouth daily., Starting Mon 02/15/2017, Print    lisinopril (PRINIVIL,ZESTRIL) 5 MG tablet Take 1 tablet (5 mg total) by mouth daily., Starting Mon 02/15/2017, Print    potassium chloride SA (K-DUR,KLOR-CON) 20 MEQ tablet Take 1  tablet (20 mEq total) by mouth daily., Starting Sun 02/14/2017, Print      STOP taking these medications     hydrochlorothiazide (HYDRODIURIL) 25 MG tablet      ibuprofen (ADVIL,MOTRIN) 200 MG tablet        No Known Allergies Follow-up Information    Nicki Guadalajara, MD. Schedule an appointment as soon as possible for a visit in 2 week(s).   Specialty:  Cardiology Contact information: [address] [place] [address] [phone]             The results of significant diagnostics from this hospitalization (including imaging, microbiology, ancillary and laboratory) are listed below for reference.    Significant Diagnostic Studies: Dg Chest 2 View  Result Date: 02/10/2017 CLINICAL DATA:  Shortness of breath and cough for 2 days. Patient reports being unable to take his antihypertensive medication for the past 4 days. Former smoker. EXAM: CHEST  2 VIEW COMPARISON:  Chest x-ray of July 28, 2016 FINDINGS: The lungs are adequately inflated. There is no focal infiltrate. The interstitial markings are coarse The right hilar structures are prominent. The pulmonary vascularity is not engorged. The cardiac silhouette is enlarged but stable. There is calcification in the wall of the thoracic aorta. There is no pleural effusion. The bony thorax exhibits no acute abnormality. IMPRESSION: Chronic bronchitic changes with superimposed low-grade CHF with mild interstitial edema. There is no alveolar pneumonia. Thoracic aortic atherosclerosis. Electronically Signed   By: David  [place] M.D.   On: 02/10/2017 14:42    Microbiology: No results found for this or any previous visit (from the past 240 hour(s)).   Labs: Basic Metabolic Panel:  Recent Labs Lab 02/10/17 1525 02/11/17 0220 02/12/17 0339 02/12/17 1426 02/13/17 0534 02/13/17 2309 02/14/17 0425  NA 141 142 141  --  140  --  141  K 4.0 3.9 3.4*  --  4.2  --  4.0  CL 109 105 100*  --  103  --  103  CO2 22 29 28   --  27  --  28  GLUCOSE 103* 92 100*  --  91  --  97  BUN 14 13 14   --  14  --  18  CREATININE 0.93 1.05 1.12 1.09 1.01  --  1.00  CALCIUM 8.6* 8.8* 9.0  --  8.8*  --  8.9  MG  --   --   --   --   --  2.4  --    Liver Function Tests:  Recent Labs Lab 02/14/17 0425  AST 28  ALT 28  ALKPHOS 49  BILITOT 1.5*  PROT 7.2  ALBUMIN 3.4*   No results for input(s): LIPASE, AMYLASE in the last 168 hours. No results for input(s): AMMONIA in the last 168  hours. CBC:  Recent Labs Lab 02/10/17 1525 02/12/17 1426  WBC 9.5 10.4  NEUTROABS 6.0  --   HGB 13.8 14.4  HCT 42.9 43.9  MCV 100.9* 101.2*  PLT 222 236   Cardiac Enzymes: No results for input(s): CKTOTAL, CKMB, CKMBINDEX, TROPONINI in the last 168 hours. BNP: BNP (last 3 results)  Recent Labs  07/28/16 2052 02/10/17 1525  BNP 414.3* 752.4*    ProBNP (last 3 results) No results for input(s): PROBNP in the last 8760 hours.  CBG: No results for input(s): GLUCAP in the last 168 hours.     SignedZannie Cove MD.  [facility] 02/14/2017, 1:47 PM

## 2017-02-22 ENCOUNTER — Ambulatory Visit: Payer: Self-pay | Admitting: Cardiovascular Disease

## 2017-03-09 ENCOUNTER — Ambulatory Visit (INDEPENDENT_AMBULATORY_CARE_PROVIDER_SITE_OTHER): Payer: Self-pay | Admitting: Cardiovascular Disease

## 2017-03-09 ENCOUNTER — Encounter: Payer: Self-pay | Admitting: Cardiovascular Disease

## 2017-03-09 VITALS — BP 150/106 | HR 73 | Ht 68.0 in | Wt 301.0 lb

## 2017-03-09 DIAGNOSIS — I272 Pulmonary hypertension, unspecified: Secondary | ICD-10-CM

## 2017-03-09 DIAGNOSIS — I5041 Acute combined systolic (congestive) and diastolic (congestive) heart failure: Secondary | ICD-10-CM

## 2017-03-09 DIAGNOSIS — E785 Hyperlipidemia, unspecified: Secondary | ICD-10-CM

## 2017-03-09 DIAGNOSIS — Z6841 Body Mass Index (BMI) 40.0 and over, adult: Secondary | ICD-10-CM

## 2017-03-09 DIAGNOSIS — I428 Other cardiomyopathies: Secondary | ICD-10-CM

## 2017-03-09 MED ORDER — POTASSIUM CHLORIDE CRYS ER 20 MEQ PO TBCR: 20.0000 meq | EXTENDED_RELEASE_TABLET | Freq: Every day | ORAL | 11 refills | Status: DC

## 2017-03-09 MED ORDER — CARVEDILOL 12.5 MG PO TABS: 12.5000 mg | ORAL_TABLET | Freq: Two times a day (BID) | ORAL | 11 refills | Status: DC

## 2017-03-09 MED ORDER — LISINOPRIL 10 MG PO TABS: 10.0000 mg | ORAL_TABLET | Freq: Every day | ORAL | 11 refills | Status: DC

## 2017-03-09 MED ORDER — FUROSEMIDE 40 MG PO TABS: 40.0000 mg | ORAL_TABLET | Freq: Every day | ORAL | 11 refills | Status: DC

## 2017-03-09 MED ORDER — ATORVASTATIN CALCIUM 40 MG PO TABS: 40.0000 mg | ORAL_TABLET | Freq: Every day | ORAL | 11 refills | Status: DC

## 2017-03-09 NOTE — Progress Notes (Signed)
Cardiology Office Note    Date:  03/11/2017   ID:  Nicholas Kelly., DOB Mar 06, 1966, MRN [id number]  PCP:  Patient, No Pcp Per  Cardiologist:  [name], MD   Chief Complaint  Patient presents with  . Follow-up  . Shortness of Breath    when its time to take medication  . Edema    in legs at night.     History of Present Illness:  Nicholas Kelly. is a 51 y.o. male who presents for a hospital follow-up evaluation of his acute congestive heart failure.  [name] has a history of hypertension, and presented to [facility] hospital on 02/10/2017 from urgent care with acute onset of shortness of breath.  He was previously diagnosed with CHF in October 17 and had been on HCTZ but had run out of this prescription.  He was admitted by the hospitalist service.  A chest x-ray showed findings of CHF with pulmonary edema.  BNP was elevated at 752.  An echo Doppler study showed reduced LV function with an EF of 2025% with moderate concentric LVH, diffuse hypokinesis, grade 3 diastolic dysfunction, mild mitral regurgitation, moderate tricuspid regurgitation, and moderate bony hypertension with a PA pressure 51 mm.  Patient was seen in cardiology consultation.  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  He did not have significant obstructive CAD with only diffuse 30-50% mid and distal RCA stenoses.  There is moderate pulmonary hypertension.  He was discharged on 02/14/2017 and has been on lisinopril 5 mg, furosemide 40 mg, carvedilol 6.25 mg twice a day in addition to aspirin 81 mg, supplemental potassium 20, now quit once and atorvastatin 40 mg.  Since hospital discharge, he has felt improved.  He does note trace pretibial edema.  He denies chest pain.  He presents for evaluation.  Past Medical History:  Diagnosis Date  . Acute CHF  (congestive heart failure) ([place]) 02/10/2017  . Dyspnea   . Hypertension     Past Surgical History:  Procedure Laterality Date  . NO PAST SURGERIES    . RIGHT/LEFT HEART CATH AND CORONARY ANGIOGRAPHY N/A 02/12/2017   Procedure: Right/Left Heart Cath and Coronary Angiography;  Surgeon: Belva Crome, MD;  Location: [facility];  Service: Cardiovascular;  Laterality: N/A;    Current Medications: Outpatient Medications Prior to Visit  Medication Sig Dispense Refill  . aspirin EC 81 MG EC tablet Take 1 tablet (81 mg total) by mouth daily.    Marland Kitchen atorvastatin (LIPITOR) 40 MG tablet Take 1 tablet (40 mg total) by mouth daily at 6 PM. 30 tablet 0  . carvedilol (COREG) 6.25 MG tablet Take 1 tablet (6.25 mg total) by mouth 2 (two) times daily with a meal. 60 tablet 0  . furosemide (LASIX) 40 MG tablet Take 1 tablet (40 mg total) by mouth daily. 30 tablet 0  . lisinopril (PRINIVIL,ZESTRIL) 5 MG tablet Take 1 tablet (5 mg total) by mouth daily. 30 tablet 0  . potassium chloride SA (K-DUR,KLOR-CON) 20 MEQ tablet Take 1 tablet (20 mEq total) by mouth daily. 30 tablet 0   No facility-administered medications prior to visit.      Allergies:   Patient has no known allergies.   Social History   Social History  . Marital status: Divorced    Spouse name: N/A  . Number of children:  N/A  . Years of education: N/A   Social History Main Topics  . Smoking status: Former Smoker    Packs/day: 1.00    Years: 34.00    Types: Cigarettes    Quit date: 06/28/2016  . Smokeless tobacco: Never Used  . Alcohol use 15.6 oz/week    18 Cans of beer, 8 Shots of liquor per week  . Drug use: Yes     Comment: 02/10/2017 "experimented w/drugs; haven't done any for years"  . Sexual activity: Not Currently   Other Topics Concern  . None   Social History Narrative  . None     Family History:  The patient's family history includes Cardiomyopathy in his mother; Hypertension in his mother.   ROS General:  Negative; No fevers, chills, or night sweats;  HEENT: Negative; No changes in vision or hearing, sinus congestion, difficulty swallowing Pulmonary: Negative; No cough, wheezing, shortness of breath, hemoptysis Cardiovascular: see history of present illness, no chest pain.  No palpitations GI: Negative; No nausea, vomiting, diarrhea, or abdominal pain GU: Negative; No dysuria, hematuria, or difficulty voiding Musculoskeletal: Negative; no myalgias, joint pain, or weakness Hematologic/Oncology: Negative; no easy bruising, bleeding Endocrine: Negative; no heat/cold intolerance; no diabetes Neuro: Negative; no changes in balance, headaches Skin: Negative; No rashes or skin lesions Psychiatric: Negative; No behavioral problems, depression Sleep: Negative; No snoring, daytime sleepiness, hypersomnolence, bruxism, restless legs, hypnogognic hallucinations, no cataplexy Other comprehensive 14 point system review is negative.   PHYSICAL EXAM:   VS:  BP (!) 150/106   Pulse 73   Ht '5\' 8"'  (1.727 m)   Wt (!) 301 lb (136.5 kg)   BMI 45.77 kg/m     Repeat blood pressure by me was 130/80.  Wt Readings from Last 3 Encounters:  03/09/17 (!) 301 lb (136.5 kg)  02/14/17 288 lb 14.4 oz (131 kg)    General: Alert, oriented, no distress.  Skin: normal turgor, no rashes, warm and dry HEENT: Normocephalic, atraumatic. Pupils equal round and reactive to light; sclera anicteric; extraocular muscles intact; Fundi mild arterial narrowing Nose without nasal septal hypertrophy Mouth/Parynx benign; Mallinpatti scale 3 Neck: No JVD, no carotid bruits; normal carotid upstroke Lungs: clear to ausculatation and percussion; no wheezing or rales Chest wall: without tenderness to palpitation Heart: PMI not displaced, RRR, s1 s2 normal, 1/6 systolic murmur, no diastolic murmur, no rubs, gallops, thrills, or heaves Abdomen: soft, nontender; no hepatosplenomehaly, BS+; abdominal aorta nontender and not dilated by  palpation. Back: no CVA tenderness Pulses 2+ Musculoskeletal: full range of motion, normal strength, no joint deformities Extremities:pretibial edema no clubbing cyanosis,  Homan's sign negative  Neurologic: grossly nonfocal; Cranial nerves grossly wnl Psychologic: Normal mood and affect   Studies/Labs Reviewed:   EKG:  EKG is ordered today. ECG (independently read by me): Normal sinus rhythm at 73 bpm.  Left axis deviation.  LVH by voltage criteria with repolarization changes.  Recent Labs: BMP Latest Ref Rng & Units 02/14/2017 02/13/2017 02/12/2017  Glucose 65 - 99 mg/dL 97 91 -  BUN 6 - 20 mg/dL 18 14 -  Creatinine 0.61 - 1.24 mg/dL 1.00 1.01 1.09  Sodium 135 - 145 mmol/L 141 140 -  Potassium 3.5 - 5.1 mmol/L 4.0 4.2 -  Chloride 101 - 111 mmol/L 103 103 -  CO2 22 - 32 mmol/L 28 27 -  Calcium 8.9 - 10.3 mg/dL 8.9 8.8(L) -     Hepatic Function Latest Ref Rng & Units 02/14/2017  Total Protein 6.5 - 8.1  g/dL 7.2  Albumin 3.5 - 5.0 g/dL 3.4(L)  AST 15 - 41 U/L 28  ALT 17 - 63 U/L 28  Alk Phosphatase 38 - 126 U/L 49  Total Bilirubin 0.3 - 1.2 mg/dL 1.5(H)  Bilirubin, Direct 0.1 - 0.5 mg/dL 0.2    CBC Latest Ref Rng & Units 02/12/2017 02/10/2017  WBC 4.0 - 10.5 K/uL 10.4 9.5  Hemoglobin 13.0 - 17.0 g/dL 14.4 13.8  Hematocrit 39.0 - 52.0 % 43.9 42.9  Platelets 150 - 400 K/uL 236 222   Lab Results  Component Value Date   MCV 101.2 (H) 02/12/2017   MCV 100.9 (H) 02/10/2017   No results found for: TSH Lab Results  Component Value Date   HGBA1C 4.8 02/11/2017     BNP    Component Value Date/Time   BNP 752.4 (H) 02/10/2017 1525    ProBNP No results found for: PROBNP   Lipid Panel     Component Value Date/Time   CHOL 153 02/11/2017 0220   TRIG 74 02/11/2017 0220   HDL 34 (L) 02/11/2017 0220   CHOLHDL 4.5 02/11/2017 0220   VLDL 15 02/11/2017 0220   LDLCALC 104 (H) 02/11/2017 0220     RADIOLOGY: Dg Chest 2 View  Result Date: 02/10/2017 CLINICAL DATA:   Shortness of breath and cough for 2 days. Patient reports being unable to take his antihypertensive medication for the past 4 days. Former smoker. EXAM: CHEST  2 VIEW COMPARISON:  Chest x-ray of July 28, 2016 FINDINGS: The lungs are adequately inflated. There is no focal infiltrate. The interstitial markings are coarse The right hilar structures are prominent. The pulmonary vascularity is not engorged. The cardiac silhouette is enlarged but stable. There is calcification in the wall of the thoracic aorta. There is no pleural effusion. The bony thorax exhibits no acute abnormality. IMPRESSION: Chronic bronchitic changes with superimposed low-grade CHF with mild interstitial edema. There is no alveolar pneumonia. Thoracic aortic atherosclerosis. Electronically Signed   By: David  Martinique M.D.   On: 02/10/2017 14:42     Additional studies/ records that were reviewed today include:  I reviewed the patient's recent hospitalization, his cardiac consultation, cardiac catheterization report, and hospitalist records.    ASSESSMENT:    1. Non-ischemic cardiomyopathy ([place])   2. Acute combined systolic and diastolic congestive heart failure (HCC)   3. Class 3 severe obesity due to excess calories with serious comorbidity and body mass index (BMI) of 40.0 to 44.9 in adult ([place])   4. Hyperlipidemia with target LDL less than 70   5. Moderate to severe pulmonary hypertension (HCC)      PLAN:  Mr. Terisa Starr is a 51 year old Afro-American gentleman who has a history of morbid obesity with BMI of 45.77.  Recently was admitted with acute systolic and diastolic congestive heart failure.  He was found to have an EF of 20-25%.  There was no significant coronary obstructive disease, although mild nonobstructive disease was present in his RCA.  There was evidence for moderate pulmonary hypertension.  He continues to have blood pressure lability.  Clinically, he is feeling improved on his current regimen and I will now.   Further titrate lisinopril to 10 mg and titrate carvedilol to 12.5 mg twice a day. Presently, he does not have insurance and is working on trying to obtain a Medicaid card.  He may ultimately be a candidate for switching to entresto.  At present, we will make the above adjustments to his medical regimen.  In the future.  We will also initiate spironolactone.  Will recheck a chemistry profile, lipid panel.  I will see him back to the office in 6 weeks for cardiology reevaluation.   Medication Adjustments/Labs and Tests Ordered: Current medicines are reviewed at length with the patient today.  Concerns regarding medicines are outlined above.  Medication changes, Labs and Tests ordered today are listed in the Patientt Instructions below. Patient Instructions  MEDICATION CHANGES INCREASE TO LISINOPRIL 10 MG ONE TABLET DAILY INCREASE CARVEDILOL 12.5 MG ONE TABLET TWICE A DAY     IN 3 WEEKS LABS  ( WEEK OF  March 30 2017 ) CMP LIPIDS    Your physician recommends that you schedule a follow-up appointment in [place].     Signed, [name], MD  03/11/2017 6:47 PM    [facility] [address] [place], [place], [place]  [address] Phone: [phone]

## 2017-03-09 NOTE — Patient Instructions (Addendum)
MEDICATION CHANGES INCREASE TO LISINOPRIL 10 MG ONE TABLET DAILY INCREASE CARVEDILOL 12.5 MG ONE TABLET TWICE A DAY     IN 3 WEEKS LABS  ( WEEK OF  March 30 2017 ) CMP LIPIDS    Your physician recommends that you schedule a follow-up appointment in 6 WEEKS WITH DR Tresa Endo.

## 2017-03-31 LAB — LIPID PANEL
CHOLESTEROL: 107 mg/dL (ref <200)
HDL: 42 mg/dL (ref >40)
LDL Cholesterol: 53 mg/dL (ref <100)
Total CHOL/HDL Ratio: 2.5 Ratio (ref <5.0)
Triglycerides: 61 mg/dL (ref <150)
VLDL: 12 mg/dL (ref <30)

## 2017-03-31 LAB — COMPREHENSIVE METABOLIC PANEL
ALK PHOS: 51 U/L (ref 40–115)
ALT: 19 U/L (ref 9–46)
AST: 19 U/L (ref 10–35)
Albumin: 3.6 g/dL (ref 3.6–5.1)
BUN: 19 mg/dL (ref 7–25)
CALCIUM: 8.5 mg/dL — AB (ref 8.6–10.3)
CO2: 26 mmol/L (ref 20–31)
Chloride: 109 mmol/L (ref 98–110)
Creat: 0.93 mg/dL (ref 0.70–1.33)
Glucose, Bld: 86 mg/dL (ref 65–99)
POTASSIUM: 4.3 mmol/L (ref 3.5–5.3)
Sodium: 144 mmol/L (ref 135–146)
TOTAL PROTEIN: 6.6 g/dL (ref 6.1–8.1)
Total Bilirubin: 1.1 mg/dL (ref 0.2–1.2)

## 2017-04-21 ENCOUNTER — Ambulatory Visit (INDEPENDENT_AMBULATORY_CARE_PROVIDER_SITE_OTHER): Payer: Self-pay | Admitting: Cardiovascular Disease

## 2017-04-21 ENCOUNTER — Telehealth: Payer: Self-pay | Admitting: Cardiovascular Disease

## 2017-04-21 ENCOUNTER — Encounter: Payer: Self-pay | Admitting: Cardiovascular Disease

## 2017-04-21 VITALS — BP 146/94 | HR 77 | Ht 68.0 in | Wt 302.0 lb

## 2017-04-21 DIAGNOSIS — Z79899 Other long term (current) drug therapy: Secondary | ICD-10-CM

## 2017-04-21 DIAGNOSIS — E785 Hyperlipidemia, unspecified: Secondary | ICD-10-CM

## 2017-04-21 DIAGNOSIS — I272 Pulmonary hypertension, unspecified: Secondary | ICD-10-CM

## 2017-04-21 DIAGNOSIS — I5041 Acute combined systolic (congestive) and diastolic (congestive) heart failure: Secondary | ICD-10-CM

## 2017-04-21 DIAGNOSIS — I428 Other cardiomyopathies: Secondary | ICD-10-CM

## 2017-04-21 MED ORDER — SPIRONOLACTONE 25 MG PO TABS: ORAL_TABLET | ORAL | 6 refills | Status: DC

## 2017-04-21 MED ORDER — LISINOPRIL 10 MG PO TABS: 15.0000 mg | ORAL_TABLET | Freq: Every day | ORAL | 11 refills | Status: DC

## 2017-04-21 NOTE — Telephone Encounter (Signed)
The pharmacist wanted to verify that the patient should take Lisinopril and Spironolactone together due to the potassium sparing effects. Ordered was verified and will be filled.

## 2017-04-21 NOTE — Telephone Encounter (Signed)
°  New Prob  Has a question regarding spironolactone (ALDACTONE) 25 MG tablet as patient is also on lisinopril (PRINIVIL,ZESTRIL) 10 MG tablet. Please call.

## 2017-04-21 NOTE — Progress Notes (Signed)
Cardiology Office Note    Date:  04/21/2017   ID:  [name], DOB [birth year], MRN [id number]  PCP:  Patient, No Pcp Per  Cardiologist:  [name], MD   Chief Complaint  Patient presents with  . Follow-up    pt denied chest pain and SOB    History of Present Illness:  Nicholas Sheller. is a 51 y.o. male who presents for a hospital follow-up evaluation of his acute congestive heart failure.  Nicholas Kelly has a history of hypertension, and presented to [facility] hospital on 02/10/2017 from urgent care with acute onset of shortness of breath.  He was previously diagnosed with CHF in October 17 and had been on HCTZ but had run out of this prescription.  He was admitted by the hospitalist service.  A chest x-ray showed findings of CHF with pulmonary edema.  BNP was elevated at 752.  An echo Doppler study showed reduced LV function with an EF of 2025% with moderate concentric LVH, diffuse hypokinesis, grade 3 diastolic dysfunction, mild mitral regurgitation, moderate tricuspid regurgitation, and moderate bony hypertension with a PA pressure 51 mm.  Patient was seen in cardiology consultation.  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  He did not have significant obstructive CAD with only diffuse 30-50% mid and distal RCA stenoses.  There is moderate pulmonary hypertension.  He was discharged on 02/14/2017 and has been on lisinopril 5 mg, furosemide 40 mg, carvedilol 6.25 mg twice a day in addition to aspirin 81 mg, supplemental potassium 20, now quit once and atorvastatin 40 mg.    When I saw for initial post hospital evaluation on 03/11/2017.  I reviewed his hospital data in its entirety.  He was feeling improved on his initial medical regimen and I recommended further titration of his lisinopril up to 10 mg and increase carvedilol to  12.5 mg twice a day.  He did not have insurance and is working on trying to obtain a Medicaid card.  We talked in the future about possibly switching him to entresto.  On his increase regimen, he has felt improved.  He denies PND or orthopnea.  He is less short of breath.  He presents for reevaluation.  Past Medical History:  Diagnosis Date  . Acute CHF (congestive heart failure) ([place]) 02/10/2017  . Dyspnea   . Hypertension     Past Surgical History:  Procedure Laterality Date  . NO PAST SURGERIES    . RIGHT/LEFT HEART CATH AND CORONARY ANGIOGRAPHY N/A 02/12/2017   Procedure: Right/Left Heart Cath and Coronary Angiography;  Surgeon: Belva Crome, MD;  Location: [facility];  Service: Cardiovascular;  Laterality: N/A;    Current Medications: Outpatient Medications Prior to Visit  Medication Sig Dispense Refill  . aspirin EC 81 MG EC tablet Take 1 tablet (81 mg total) by mouth daily.    Marland Kitchen atorvastatin (LIPITOR) 40 MG tablet Take 1 tablet (40 mg total) by mouth daily at 6 PM. 30 tablet 11  . carvedilol (COREG) 12.5 MG tablet Take 1 tablet (12.5 mg total) by mouth 2 (two) times daily. 60 tablet 11  . furosemide (LASIX) 40 MG tablet Take 1 tablet (40 mg total) by mouth daily. 30 tablet 11  . potassium chloride SA (K-DUR,KLOR-CON) 20 MEQ tablet Take 1 tablet (20 mEq total) by mouth daily. 30 tablet 11  .  lisinopril (PRINIVIL,ZESTRIL) 10 MG tablet Take 1 tablet (10 mg total) by mouth daily. 30 tablet 11   No facility-administered medications prior to visit.      Allergies:   Patient has no known allergies.   Social History   Social History  . Marital status: Divorced    Spouse name: N/A  . Number of children: N/A  . Years of education: N/A   Social History Main Topics  . Smoking status: Former Smoker    Packs/day: 1.00    Years: 34.00    Types: Cigarettes    Quit date: 06/28/2016  . Smokeless tobacco: Never Used  . Alcohol use 15.6 oz/week    18 Cans of beer, 8 Shots of  liquor per week  . Drug use: Yes     Comment: 02/10/2017 "experimented w/drugs; haven't done any for years"  . Sexual activity: Not Currently   Other Topics Concern  . Not on file   Social History Narrative  . No narrative on file     Family History:  The patient's family history includes Cardiomyopathy in his mother; Hypertension in his mother.   ROS General: Negative; No fevers, chills, or night sweats;  HEENT: Negative; No changes in vision or hearing, sinus congestion, difficulty swallowing Pulmonary: Negative; No cough, wheezing, shortness of breath, hemoptysis Cardiovascular: see history of present illness, no chest pain.  No palpitations GI: Negative; No nausea, vomiting, diarrhea, or abdominal pain GU: Negative; No dysuria, hematuria, or difficulty voiding Musculoskeletal: Negative; no myalgias, joint pain, or weakness Hematologic/Oncology: Negative; no easy bruising, bleeding Endocrine: Negative; no heat/cold intolerance; no diabetes Neuro: Negative; no changes in balance, headaches Skin: Negative; No rashes or skin lesions Psychiatric: Negative; No behavioral problems, depression Sleep: Negative; No snoring, daytime sleepiness, hypersomnolence, bruxism, restless legs, hypnogognic hallucinations, no cataplexy Other comprehensive 14 point system review is negative.   PHYSICAL EXAM:   VS:  BP (!) 146/94   Pulse 77   Ht '5\' 8"'  (1.727 m)   Wt (!) 302 lb (137 kg)   BMI 45.92 kg/m     Repeat blood pressure by me was 130/80.  Wt Readings from Last 3 Encounters:  04/21/17 (!) 302 lb (137 kg)  03/09/17 (!) 301 lb (136.5 kg)  02/14/17 288 lb 14.4 oz (131 kg)    General: Alert, oriented, no distress.  Skin: normal turgor, no rashes, warm and dry HEENT: Normocephalic, atraumatic. Pupils equal round and reactive to light; sclera anicteric; extraocular muscles intact; Fundi mild arterial narrowing Nose without nasal septal hypertrophy Mouth/Parynx benign; Mallinpatti scale  3 Neck: No JVD, no carotid bruits; normal carotid upstroke Lungs: clear to ausculatation and percussion; no wheezing or rales Chest wall: without tenderness to palpitation Heart: PMI not displaced, RRR, s1 s2 normal, 1/6 systolic murmur, no diastolic murmur, no rubs, gallops, thrills, or heaves Abdomen: soft, nontender; no hepatosplenomehaly, BS+; abdominal aorta nontender and not dilated by palpation. Back: no CVA tenderness Pulses 2+ Musculoskeletal: full range of motion, normal strength, no joint deformities Extremities:pretibial edema no clubbing cyanosis,  Homan's sign negative  Neurologic: grossly nonfocal; Cranial nerves grossly wnl Psychologic: Normal mood and affect   Studies/Labs Reviewed:   EKG:  EKG is ordered today. Normal sinus rhythm at 77 bpm.  LVH with repolarization changes.  PR interval 184 ms.  QTc interval 488 ms.  03/11/2017 ECG (independently read by me): Normal sinus rhythm at 73 bpm.  Left axis deviation.  LVH by voltage criteria with repolarization changes.  Recent Labs: BMP Latest  Ref Rng & Units 03/31/2017 02/14/2017 02/13/2017  Glucose 65 - 99 mg/dL 86 97 91  BUN 7 - 25 mg/dL '19 18 14  ' Creatinine 0.70 - 1.33 mg/dL 0.93 1.00 1.01  Sodium 135 - 146 mmol/L 144 141 140  Potassium 3.5 - 5.3 mmol/L 4.3 4.0 4.2  Chloride 98 - 110 mmol/L 109 103 103  CO2 20 - 31 mmol/L '26 28 27  ' Calcium 8.6 - 10.3 mg/dL 8.5(L) 8.9 8.8(L)     Hepatic Function Latest Ref Rng & Units 03/31/2017 02/14/2017  Total Protein 6.1 - 8.1 g/dL 6.6 7.2  Albumin 3.6 - 5.1 g/dL 3.6 3.4(L)  AST 10 - 35 U/L 19 28  ALT 9 - 46 U/L 19 28  Alk Phosphatase 40 - 115 U/L 51 49  Total Bilirubin 0.2 - 1.2 mg/dL 1.1 1.5(H)  Bilirubin, Direct 0.1 - 0.5 mg/dL - 0.2    CBC Latest Ref Rng & Units 02/12/2017 02/10/2017  WBC 4.0 - 10.5 K/uL 10.4 9.5  Hemoglobin 13.0 - 17.0 g/dL 14.4 13.8  Hematocrit 39.0 - 52.0 % 43.9 42.9  Platelets 150 - 400 K/uL 236 222   Lab Results  Component Value Date   MCV 101.2  (H) 02/12/2017   MCV 100.9 (H) 02/10/2017   No results found for: TSH Lab Results  Component Value Date   HGBA1C 4.8 02/11/2017     BNP    Component Value Date/Time   BNP 752.4 (H) 02/10/2017 1525    ProBNP No results found for: PROBNP   Lipid Panel     Component Value Date/Time   CHOL 107 03/31/2017 1057   TRIG 61 03/31/2017 1057   HDL 42 03/31/2017 1057   CHOLHDL 2.5 03/31/2017 1057   VLDL 12 03/31/2017 1057   LDLCALC 53 03/31/2017 1057     RADIOLOGY: No results found.   Additional studies/ records that were reviewed today include:  I reviewed the patient's recent hospitalization, his cardiac consultation, cardiac catheterization report, and hospitalist records.    ASSESSMENT:    1. Non-ischemic cardiomyopathy ([place])   2. Acute combined systolic and diastolic congestive heart failure ([place])   3. Medication management   4. Moderate to severe pulmonary hypertension ([place])   5. Morbid obesity ([place])   6. Hyperlipidemia with target LDL less than 70      PLAN:  Nicholas Kelly is a 51 year old African-American gentleman who has a history of morbid obesity with BMI of 45.77.  Recently was admitted with acute systolic and diastolic congestive heart failure.  He was found to have an EF of 20-25%.  There was no significant coronary obstructive disease, although mild nonobstructive disease was present in his RCA.  There was evidence for moderate pulmonary hypertension.  He has felt improved with his increased medical regimen.  His blood pressure today continues to be elevated and I recommended the addition of spironolactone at 12.5 mg daily for 1 week and he will then increase this to 12.5 mg twice a day.  I will also further titrate lisinopril to 15 mg.  In 2-3 weeks.  A follow-up bmet will be obtained.  His resting pulse is in the 70s.  I will see him in 6-8 weeks for follow-up evaluation and at that office visit.  Further titration of carvedilol and probable additional  titration of lisinopril will be undertaken.  He is morbidly obese with a BMI of 45.92.  We discussed the importance of activity and increased exercise.  I will see him in 6-8 weeks  for reevaluation.   Medication Adjustments/Labs and Tests Ordered: Current medicines are reviewed at length with the patient today.  Concerns regarding medicines are outlined above.  Medication changes, Labs and Tests ordered today are listed in the Patientt Instructions below. Patient Instructions  Medication Instructions:   1.) Increase the lisinopril to 15 mg daily ( 1 & 1/2 tablet)  2.) start new spironolactone 25 mg prescription as directed.    Labwork:  B-MET in 2-3 weeks.   Testing/Procedures:  NONE ORDERED.  Follow-Up:  6-8 weeks.  Any Other Special Instructions Will Be Listed Below (If Applicable).      Signed, [name], MD , [facility] 04/21/2017 11:43 AM    [facility] [address], [place], [place], [place]  [address] Phone: [phone]

## 2017-04-21 NOTE — Patient Instructions (Signed)
Medication Instructions:   1.) Increase the lisinopril to 15 mg daily ( 1 & 1/2 tablet)  2.) start new spironolactone 25 mg prescription as directed.    Labwork:  B-MET in 2-3 weeks.   Testing/Procedures:  NONE ORDERED.  Follow-Up:  6-8 weeks.  Any Other Special Instructions Will Be Listed Below (If Applicable).

## 2017-06-04 ENCOUNTER — Ambulatory Visit: Payer: Self-pay | Admitting: Cardiovascular Disease

## 2017-07-17 ENCOUNTER — Emergency Department (HOSPITAL_COMMUNITY): Payer: Self-pay

## 2017-07-17 ENCOUNTER — Observation Stay (HOSPITAL_COMMUNITY)
Admission: EM | Admit: 2017-07-17 | Discharge: 2017-07-19 | Disposition: A | Discharge status: Home / Self Care | Payer: Self-pay | Attending: Internal Medicine | Admitting: Internal Medicine

## 2017-07-17 ENCOUNTER — Encounter (HOSPITAL_COMMUNITY): Payer: Self-pay

## 2017-07-17 DIAGNOSIS — Z6841 Body Mass Index (BMI) 40.0 and over, adult: Secondary | ICD-10-CM | POA: Insufficient documentation

## 2017-07-17 DIAGNOSIS — Z79899 Other long term (current) drug therapy: Secondary | ICD-10-CM | POA: Insufficient documentation

## 2017-07-17 DIAGNOSIS — I639 Cerebral infarction, unspecified: Secondary | ICD-10-CM

## 2017-07-17 DIAGNOSIS — I5042 Chronic combined systolic (congestive) and diastolic (congestive) heart failure: Secondary | ICD-10-CM | POA: Insufficient documentation

## 2017-07-17 DIAGNOSIS — I110 Hypertensive heart disease with heart failure: Principal | ICD-10-CM | POA: Insufficient documentation

## 2017-07-17 DIAGNOSIS — H539 Unspecified visual disturbance: Secondary | ICD-10-CM

## 2017-07-17 DIAGNOSIS — I7389 Other specified peripheral vascular diseases: Secondary | ICD-10-CM | POA: Insufficient documentation

## 2017-07-17 DIAGNOSIS — Z87891 Personal history of nicotine dependence: Secondary | ICD-10-CM | POA: Insufficient documentation

## 2017-07-17 DIAGNOSIS — I10 Essential (primary) hypertension: Secondary | ICD-10-CM | POA: Diagnosis present

## 2017-07-17 DIAGNOSIS — I6312 Cerebral infarction due to embolism of basilar artery: Secondary | ICD-10-CM

## 2017-07-17 DIAGNOSIS — E785 Hyperlipidemia, unspecified: Secondary | ICD-10-CM | POA: Insufficient documentation

## 2017-07-17 DIAGNOSIS — H532 Diplopia: Secondary | ICD-10-CM | POA: Insufficient documentation

## 2017-07-17 DIAGNOSIS — E669 Obesity, unspecified: Secondary | ICD-10-CM | POA: Insufficient documentation

## 2017-07-17 DIAGNOSIS — I6340 Cerebral infarction due to embolism of unspecified cerebral artery: Secondary | ICD-10-CM | POA: Insufficient documentation

## 2017-07-17 DIAGNOSIS — Z7982 Long term (current) use of aspirin: Secondary | ICD-10-CM | POA: Insufficient documentation

## 2017-07-17 LAB — PROTIME-INR
INR: 1.17
PROTHROMBIN TIME: 14.8 s (ref 11.4–15.2)

## 2017-07-17 LAB — COMPREHENSIVE METABOLIC PANEL
ALBUMIN: 3.7 g/dL (ref 3.5–5.0)
ALT: 26 U/L (ref 17–63)
AST: 33 U/L (ref 15–41)
Alkaline Phosphatase: 53 U/L (ref 38–126)
Anion gap: 7 (ref 5–15)
BUN: 17 mg/dL (ref 6–20)
CHLORIDE: 103 mmol/L (ref 101–111)
CO2: 25 mmol/L (ref 22–32)
CREATININE: 0.95 mg/dL (ref 0.61–1.24)
Calcium: 8.9 mg/dL (ref 8.9–10.3)
GFR calc Af Amer: >60 mL/min (ref >60)
GLUCOSE: 99 mg/dL (ref 65–99)
POTASSIUM: 4.2 mmol/L (ref 3.5–5.1)
Sodium: 135 mmol/L (ref 135–145)
Total Bilirubin: 1.5 mg/dL — ABNORMAL HIGH (ref 0.3–1.2)
Total Protein: 7.3 g/dL (ref 6.5–8.1)

## 2017-07-17 LAB — DIFFERENTIAL
BASOS ABS: 0 10*3/uL (ref 0.0–0.1)
BASOS PCT: 0 %
EOS ABS: 0.3 10*3/uL (ref 0.0–0.7)
EOS PCT: 3 %
LYMPHS ABS: 2.4 10*3/uL (ref 0.7–4.0)
Lymphocytes Relative: 26 %
Monocytes Absolute: 0.9 10*3/uL (ref 0.1–1.0)
Monocytes Relative: 10 %
NEUTROS PCT: 61 %
Neutro Abs: 5.5 10*3/uL (ref 1.7–7.7)

## 2017-07-17 LAB — CBC
HCT: 42.8 % (ref 39.0–52.0)
HEMOGLOBIN: 14.4 g/dL (ref 13.0–17.0)
MCH: 33 pg (ref 26.0–34.0)
MCHC: 33.6 g/dL (ref 30.0–36.0)
MCV: 98.2 fL (ref 78.0–100.0)
PLATELETS: 241 10*3/uL (ref 150–400)
RBC: 4.36 MIL/uL (ref 4.22–5.81)
RDW: 11.9 % (ref 11.5–15.5)
WBC: 9.1 10*3/uL (ref 4.0–10.5)

## 2017-07-17 LAB — I-STAT CHEM 8, ED
BUN: 22 mg/dL — AB (ref 6–20)
CALCIUM ION: 1.07 mmol/L — AB (ref 1.15–1.40)
Chloride: 103 mmol/L (ref 101–111)
Creatinine, Ser: 0.9 mg/dL (ref 0.61–1.24)
Glucose, Bld: 100 mg/dL — ABNORMAL HIGH (ref 65–99)
HEMATOCRIT: 44 % (ref 39.0–52.0)
HEMOGLOBIN: 15 g/dL (ref 13.0–17.0)
Potassium: 4.1 mmol/L (ref 3.5–5.1)
SODIUM: 140 mmol/L (ref 135–145)
TCO2: 27 mmol/L (ref 22–32)

## 2017-07-17 LAB — APTT: APTT: 30 s (ref 24–36)

## 2017-07-17 LAB — I-STAT TROPONIN, ED: TROPONIN I, POC: 0.04 ng/mL (ref 0.00–0.08)

## 2017-07-17 MED ORDER — LORAZEPAM 2 MG/ML IJ SOLN
1.0000 mg | Freq: Once | INTRAMUSCULAR | Status: AC | PRN
  Administered 2017-07-17: 1 mg via INTRAVENOUS
  Filled 2017-07-17: qty 1

## 2017-07-17 MED ORDER — CARVEDILOL 12.5 MG PO TABS
12.5000 mg | ORAL_TABLET | Freq: Two times a day (BID) | ORAL | Status: DC
  Administered 2017-07-18 – 2017-07-19 (×3): 12.5 mg via ORAL
  Filled 2017-07-17 (×3): qty 1

## 2017-07-17 MED ORDER — ATORVASTATIN CALCIUM 40 MG PO TABS: 40.0000 mg | ORAL_TABLET | Freq: Every day | ORAL | Status: DC

## 2017-07-17 MED ORDER — POTASSIUM CHLORIDE CRYS ER 20 MEQ PO TBCR
20.0000 meq | EXTENDED_RELEASE_TABLET | Freq: Every day | ORAL | Status: DC
  Administered 2017-07-18 – 2017-07-19 (×2): 20 meq via ORAL
  Filled 2017-07-17 (×2): qty 1

## 2017-07-17 MED ORDER — ASPIRIN 81 MG PO TBEC
81.0000 mg | DELAYED_RELEASE_TABLET | Freq: Every day | ORAL | Status: DC
Start: 2017-07-18 — End: 2017-07-17

## 2017-07-17 MED ORDER — SPIRONOLACTONE 25 MG PO TABS
25.0000 mg | ORAL_TABLET | Freq: Every day | ORAL | Status: DC
  Administered 2017-07-18 – 2017-07-19 (×2): 25 mg via ORAL
  Filled 2017-07-17 (×2): qty 1

## 2017-07-17 MED ORDER — ENOXAPARIN SODIUM 40 MG/0.4ML ~~LOC~~ SOLN
40.0000 mg | SUBCUTANEOUS | Status: DC
  Administered 2017-07-18 – 2017-07-19 (×2): 40 mg via SUBCUTANEOUS
  Filled 2017-07-17 (×2): qty 0.4

## 2017-07-17 MED ORDER — LISINOPRIL 5 MG PO TABS
15.0000 mg | ORAL_TABLET | Freq: Every day | ORAL | Status: DC
  Administered 2017-07-18 – 2017-07-19 (×2): 15 mg via ORAL
  Filled 2017-07-17 (×2): qty 1

## 2017-07-17 MED ORDER — GADOBENATE DIMEGLUMINE 529 MG/ML IV SOLN
20.0000 mL | Freq: Once | INTRAVENOUS | Status: AC
  Administered 2017-07-17: 20 mL via INTRAVENOUS

## 2017-07-17 MED ORDER — STROKE: EARLY STAGES OF RECOVERY BOOK
Freq: Once | Status: AC
  Administered 2017-07-17
  Filled 2017-07-17: qty 1

## 2017-07-17 MED ORDER — ACETAMINOPHEN 325 MG PO TABS: 650.0000 mg | ORAL_TABLET | ORAL | Status: DC | PRN

## 2017-07-17 MED ORDER — FUROSEMIDE 40 MG PO TABS
40.0000 mg | ORAL_TABLET | Freq: Every day | ORAL | Status: DC
  Administered 2017-07-18 – 2017-07-19 (×2): 40 mg via ORAL
  Filled 2017-07-17 (×2): qty 1

## 2017-07-17 MED ORDER — ACETAMINOPHEN 650 MG RE SUPP: 650.0000 mg | RECTAL | Status: DC | PRN

## 2017-07-17 MED ORDER — ACETAMINOPHEN 160 MG/5ML PO SOLN: 650.0000 mg | ORAL | Status: DC | PRN

## 2017-07-17 MED ORDER — SENNOSIDES-DOCUSATE SODIUM 8.6-50 MG PO TABS: 1.0000 | ORAL_TABLET | Freq: Every evening | ORAL | Status: DC | PRN

## 2017-07-17 MED ORDER — ASPIRIN 325 MG PO TABS
325.0000 mg | ORAL_TABLET | Freq: Every day | ORAL | Status: DC
  Administered 2017-07-18 – 2017-07-19 (×2): 325 mg via ORAL
  Filled 2017-07-17 (×2): qty 1

## 2017-07-17 NOTE — ED Notes (Signed)
Pt back from CT

## 2017-07-17 NOTE — H&P (Signed)
History and Physical    Nicholas Kelly. MMI:[id number] DOB: 07-18-1966 DOA: 07/17/2017  PCP: Patient, No Pcp Per   Patient coming from: Home  Chief Complaint: Visual disturbance   HPI: Nicholas Nulf. is a 51 y.o. male with medical history significant for hypertension, obesity, and nonischemic cardiomyopathy with EF 20-25%, now presenting to the emergency department for evaluation of visual disturbance for the past week. Patient reports diplopia for at least one week, worse with gazing laterally to either side. His family is noted that his gait has been on stable over the same interval. Denies headache, change in hearing, numbness, or weakness. No chest pain, no fevers or chills, and no nausea or vomiting. No recent head trauma or fall. Patient saw his ophthalmologist for evaluation of these findings and was recommended to proceed to the emergency department for evaluation of possible stroke.  ED Course: Upon arrival to the ED, patient is found to be afebrile, saturating well on room air, and with vitals otherwise stable. EKG features a normal sinus rhythm with LVH by voltage criteria and secondary polarization abnormality. 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 Chemistry panels unremarkable and so a CBC. Troponin is negative and INR is within the normal limits. Neurology was consulted by the ED physician and recommended a medical admission with further evaluation of possible CVA/TIA.  Review of Systems:  All other systems reviewed and apart from HPI, are negative.  Past Medical History:  Diagnosis Date  . Acute CHF (congestive heart failure) ([facility]) 02/10/2017  . Dyspnea   . Hypertension     Past Surgical History:  Procedure Laterality Date  . NO PAST SURGERIES    . RIGHT/LEFT HEART CATH AND CORONARY ANGIOGRAPHY N/A 02/12/2017   Procedure: Right/Left Heart Cath and Coronary Angiography;  Surgeon: Lyn Records, MD;  Location: [facility] INVASIVE [facility];  Service: Cardiovascular;  Laterality: N/A;     reports that he quit smoking about 12 months ago. His smoking use included Cigarettes. He has a 34.00 pack-year smoking history. He has never used smokeless tobacco. He reports that he drinks about 15.6 oz of alcohol per week . He reports that he uses drugs.  No Known Allergies  Family History  Problem Relation Age of Onset  . Hypertension Mother   . Cardiomyopathy Mother      Prior to Admission medications   Medication Sig Start Date End Date Taking? Authorizing Provider  aspirin EC 81 MG EC tablet Take 1 tablet (81 mg total) by mouth daily. 02/15/17  Yes Zannie Cove, MD  atorvastatin (LIPITOR) 40 MG tablet Take 1 tablet (40 mg total) by mouth daily at 6 PM. 03/09/17  Yes Lennette Bihari, MD  carvedilol (COREG) 12.5 MG tablet Take 1 tablet (12.5 mg total) by mouth 2 (two) times daily. 03/09/17 07/17/17 Yes Lennette Bihari, MD  furosemide (LASIX) 40 MG tablet Take 1 tablet (40 mg total) by mouth daily. 03/09/17  Yes Lennette Bihari, MD  lisinopril (PRINIVIL,ZESTRIL) 10 MG tablet Take 1.5 tablets (15 mg total) by mouth daily. 04/21/17 07/20/17 Yes Lennette Bihari, MD  potassium chloride SA (K-DUR,KLOR-CON) 20 MEQ tablet Take 1 tablet (20 mEq total) by mouth daily. 03/09/17  Yes Lennette Bihari, MD  spironolactone (ALDACTONE) 25 MG tablet Take 1/2 tablet daily for 1 week then increase to 1/2 tablet twice a day. Patient taking differently: Take 25 mg by mouth daily.  04/21/17  Yes Lennette Bihari, MD    Physical Exam: Vitals:   07/17/17 2115 07/17/17 2130 07/17/17 2145 07/17/17 2315  BP: (!) 130/95 (!) 139/93 (!) 128/95 132/86  Pulse: 80 (!) 54 81 100  Resp:  16    Temp:      TempSrc:      SpO2: 93% 95% 92% 96%      Constitutional: NAD, calm, obese  Eyes: PERTLA, lids and conjunctivae normal ENMT: Mucous membranes are moist. Posterior pharynx clear of any exudate or lesions.   Neck: normal,  supple, no masses, no thyromegaly Respiratory: clear to auscultation bilaterally, no wheezing, no crackles. Normal respiratory effort.   Cardiovascular: S1 & S2 heard, regular rate and rhythm. Trace bilateral pretibial edema. No significant JVD. Abdomen: No distension, no tenderness, no masses palpated. Bowel sounds normal.  Musculoskeletal: no clubbing / cyanosis. No joint deformity upper and lower extremities.   Skin: no significant rashes, lesions, ulcers. Warm, dry, well-perfused. Neurologic: CN 2-12 grossly intact. Sensation intact. Strength 5/5 in all 4 limbs.  Psychiatric: Alert and oriented x 3. Calm, cooperative.     Labs on Admission: I have personally reviewed following labs and imaging studies  CBC:  Recent Labs Lab 07/17/17 1320 07/17/17 1334  WBC 9.1  --   NEUTROABS 5.5  --   HGB 14.4 15.0  HCT 42.8 44.0  MCV 98.2  --   PLT 241  --    Basic Metabolic Panel:  Recent Labs Lab 07/17/17 1320 07/17/17 1334  NA 135 140  K 4.2 4.1  CL 103 103  CO2 25  --   GLUCOSE 99 100*  BUN 17 22*  CREATININE 0.95 0.90  CALCIUM 8.9  --    GFR: CrCl cannot be calculated (Unknown ideal weight.). Liver Function Tests:  Recent Labs Lab 07/17/17 1320  AST 33  ALT 26  ALKPHOS 53  BILITOT 1.5*  PROT 7.3  ALBUMIN 3.7   No results for input(s): LIPASE, AMYLASE in the last 168 hours. No results for input(s): AMMONIA in the last 168 hours. Coagulation Profile:  Recent Labs Lab 07/17/17 1320  INR 1.17   Cardiac Enzymes: No results for input(s): CKTOTAL, CKMB, CKMBINDEX, TROPONINI in the last 168 hours. BNP (last 3 results) No results for input(s): PROBNP in the last 8760 hours. HbA1C: No results for input(s): HGBA1C in the last 72 hours. CBG: No results for input(s): GLUCAP in the last 168 hours. Lipid Profile: No results for input(s): CHOL, HDL, LDLCALC, TRIG, CHOLHDL, LDLDIRECT in the last 72 hours. Thyroid Function Tests: No results for input(s): TSH,  T4TOTAL, FREET4, T3FREE, THYROIDAB in the last 72 hours. Anemia Panel: No results for input(s): VITAMINB12, FOLATE, FERRITIN, TIBC, IRON, RETICCTPCT in the last 72 hours. Urine analysis: No results found for: COLORURINE, APPEARANCEUR, LABSPEC, PHURINE, GLUCOSEU, HGBUR, BILIRUBINUR, KETONESUR, PROTEINUR, UROBILINOGEN, NITRITE, LEUKOCYTESUR Sepsis Labs: (procalcitonin:4,lacticidven:4) )No results found for this or any previous visit (from the past 240 hour(s)).   Radiological Exams on Admission: Ct Head Wo Contrast  Result Date: 07/17/2017 CLINICAL DATA:  Double vision for 1 week. EXAM: CT HEAD WITHOUT CONTRAST TECHNIQUE: Contiguous axial images were obtained from the base of the skull through the vertex without intravenous contrast. COMPARISON:  None. FINDINGS: Brain: No evidence for acute hemorrhage, mass lesion, midline shift, hydrocephalus or large infarct. Focal low-density in the right parietal lobe is suggestive for an old insult. There is subtle low-density in the left frontal white matter. Subtle areas of low-density in the white matter suggesting  chronic changes. Vascular: No hyperdense vessel or unexpected calcification. Skull: Normal. Negative for fracture or focal lesion. Sinuses/Orbits: Polyp or retention cyst in the left maxillary sinus. Otherwise, the visualized paranasal sinuses are aerated. Other: Fat attenuating soft tissue structure along the left frontal scalp. 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 IMPRESSION: No acute intracranial abnormality. Old insult in the right parietal lobe. Nonspecific white matter changes may represent chronic small vessel ischemic disease. Electronically Signed   By: Richarda Overlie M.D.   On: 07/17/2017 14:13   Mr Brain W And Wo Contrast  Result Date: 07/17/2017 CLINICAL DATA:  Diplopia EXAM: MRI HEAD WITHOUT AND WITH CONTRAST TECHNIQUE: Multiplanar, multiecho pulse sequences of the brain and surrounding structures were obtained  without and with intravenous contrast. CONTRAST:  20mL MULTIHANCE GADOBENATE DIMEGLUMINE 529 MG/ML IV SOLN COMPARISON:  Head CT 07/17/2017 FINDINGS: Brain: The midline structures are normal. There is no focal diffusion restriction to indicate acute infarct. There is beginning confluent hyperintense T2-weighted signal within the brainstem and the periventricular and deep white matter, most often seen in the setting of chronic microvascular ischemia. No intraparenchymal hematoma or chronic microhemorrhage. Brain volume is normal for age without lobar predominant atrophy. The dura is normal and there is no extra-axial collection. Vascular: Major intracranial arterial and venous sinus flow voids are preserved. Skull and upper cervical spine: The visualized skull base, calvarium, upper cervical spine are normal. Lipoma of the left frontal scalp. Sinuses/Orbits: No fluid levels or advanced mucosal thickening. No mastoid or middle ear effusion. Normal orbits. IMPRESSION: 1. No acute abnormality. 2. Chronic ischemic microangiopathy. Electronically Signed   By: Deatra Robinson M.D.   On: 07/17/2017 22:58    EKG: Independently reviewed. Sinus rhythm, LVH with secondary repolarization abnormality.   Assessment/Plan  1. Visual disturbance  - Pt presents with a week of visual disturbance, sent by his ophthalmologist for r/o CVA  - Head CT is negative for acute intracranial abnormality - Neurology is consulting and much appreciated   - Plan to continue cardiac monitoring, frequent neuro checks, MRI brain, MRA head, carotid dopplers, echocardiogram, PT/OT consults, risk-factor modification, continue statin, and increase ASA to 325  2. Chronic combined systolic/diastolic CHF  - Appears euvolemic on admission  - Echo from April '18 with EF 20-25%, grade 3 diastolic dysfunction, severe diffuse HK, mild MR, severe LAE, mild-mod TR, and moderately elevated PA pressures  - Continue current management with Aldactone, Lasix,  Coreg, lisinopril   - Follow daily wts and I/O's, echo to be updated as above   3. Hypertension  - BP is at goal  - Continue Coreg, lisinopril, and diuretics as tolerated    DVT prophylaxis: sq Lovenox  Code Status: Full  Family Communication: Discussed with patient Disposition Plan: Observe on telemetry Consults called: Neurology  Admission status: Observation    Nicholas Deutscher, MD [facility] Pager [phone]  If 7PM-7AM, please contact night-coverage [email or web address] Password [email or web address]  07/17/2017, 11:31 PM

## 2017-07-17 NOTE — ED Triage Notes (Signed)
Pt reports he has had vision changes X1 week. He reports he was sent here by his opthamologist to rule out stroke and Webino syndrome. Pt alert and oriented. Ambulatory.

## 2017-07-17 NOTE — ED Provider Notes (Signed)
[facility]-EMERGENCY DEPT Provider Note   CSN: [id number] Arrival date & time: 07/17/17  1241     History   Chief Complaint Chief Complaint  Patient presents with  . Blurred Vision    HPI Nicholas Cass. is a 51 y.o. male.  HPI 51 year old African-American male past medical history significant for hypertension, CHF that presents to the emergency department today after referral from ophthalmology for diplopia and third and sixth cranial nerve palsy. The patient states for the past 2 weeks he's had diplopia. Went to see his eye doctor today who was concerned for WEBINO, stroke or other life threatening etiology. Patient's daughter states that he has been walking "like he is drunk". Patient states this is due to his vision. Patient states that when he closes one eye his symptoms resolved. The patient denies any other associated symptoms of aphasia, dysarthria, weakness, paresthesias.  Pt denies any fever, chill, ha, vision changes, lightheadedness, dizziness, congestion, neck pain, cp, sob, cough, abd pain, n/v/d, urinary symptoms, change in bowel habits, melena, hematochezia, lower extremity paresthesias.  Past Medical History:  Diagnosis Date  . Acute CHF (congestive heart failure) (HCC) 02/10/2017  . Dyspnea   . Hypertension     Patient Active Problem List   Diagnosis Date Noted  . Non-ischemic cardiomyopathy (HCC) 03/09/2017  . Acute CHF (congestive heart failure) (HCC) 02/10/2017  . Hypertensive urgency 02/10/2017  . Obesity 02/10/2017  . Tobacco abuse 02/10/2017  . Acute exacerbation of CHF (congestive heart failure) (HCC) 02/10/2017    Past Surgical History:  Procedure Laterality Date  . NO PAST SURGERIES    . RIGHT/LEFT HEART CATH AND CORONARY ANGIOGRAPHY N/A 02/12/2017   Procedure: Right/Left Heart Cath and Coronary Angiography;  Surgeon: Lyn Records, MD;  Location: [facility] INVASIVE CV LAB;  Service: Cardiovascular;  Laterality: N/A;       Home Medications     Prior to Admission medications   Medication Sig Start Date End Date Taking? Authorizing Provider  aspirin EC 81 MG EC tablet Take 1 tablet (81 mg total) by mouth daily. 02/15/17  Yes Zannie Cove, MD  atorvastatin (LIPITOR) 40 MG tablet Take 1 tablet (40 mg total) by mouth daily at 6 PM. 03/09/17  Yes Lennette Bihari, MD  carvedilol (COREG) 12.5 MG tablet Take 1 tablet (12.5 mg total) by mouth 2 (two) times daily. 03/09/17 07/17/17 Yes Lennette Bihari, MD  furosemide (LASIX) 40 MG tablet Take 1 tablet (40 mg total) by mouth daily. 03/09/17  Yes Lennette Bihari, MD  lisinopril (PRINIVIL,ZESTRIL) 10 MG tablet Take 1.5 tablets (15 mg total) by mouth daily. 04/21/17 07/20/17 Yes Lennette Bihari, MD  potassium chloride SA (K-DUR,KLOR-CON) 20 MEQ tablet Take 1 tablet (20 mEq total) by mouth daily. 03/09/17  Yes Lennette Bihari, MD  spironolactone (ALDACTONE) 25 MG tablet Take 1/2 tablet daily for 1 week then increase to 1/2 tablet twice a day. Patient taking differently: Take 25 mg by mouth daily.  04/21/17  Yes Lennette Bihari, MD    Family History Family History  Problem Relation Age of Onset  . Hypertension Mother   . Cardiomyopathy Mother     Social History Social History  Substance Use Topics  . Smoking status: Former Smoker    Packs/day: 1.00    Years: 34.00    Types: Cigarettes    Quit date: 06/28/2016  . Smokeless tobacco: Never Used  . Alcohol use 15.6 oz/week    18 Cans of beer, 8 Shots of  liquor per week     Allergies   Patient has no known allergies.   Review of Systems Review of Systems  Constitutional: Negative for chills and fever.  HENT: Negative for congestion and sore throat.   Eyes: Positive for visual disturbance. Negative for pain.  Respiratory: Negative for cough and shortness of breath.   Cardiovascular: Negative for chest pain.  Gastrointestinal: Negative for abdominal pain, diarrhea, nausea and vomiting.  Genitourinary: Negative for dysuria, flank pain,  frequency, hematuria, scrotal swelling, testicular pain and urgency.  Musculoskeletal: Negative for arthralgias and myalgias.  Skin: Negative for rash.  Neurological: Negative for dizziness, syncope, weakness, light-headedness, numbness and headaches.  Psychiatric/Behavioral: Negative for sleep disturbance. The patient is not nervous/anxious.      Physical Exam Updated Vital Signs BP (!) 127/91 (BP Location: Right Arm)   Pulse 61   Temp 98.1 F (36.7 C) (Oral)   Resp 16   SpO2 100%   Physical Exam  Constitutional: He is oriented to person, place, and time. He appears well-developed and well-nourished.  Non-toxic appearance. No distress.  HENT:  Head: Normocephalic and atraumatic.  Mouth/Throat: Oropharynx is clear and moist.  Eyes: Pupils are equal, round, and reactive to light. Conjunctivae are normal. Right eye exhibits no discharge. Left eye exhibits no discharge.  Neck: Normal range of motion. Neck supple.  Cardiovascular: Normal rate, regular rhythm, normal heart sounds and intact distal pulses.  Exam reveals no gallop and no friction rub.   No murmur heard. Pulmonary/Chest: Effort normal and breath sounds normal. No respiratory distress. He exhibits no tenderness.  Abdominal: Soft. Bowel sounds are normal. He exhibits no distension. There is no tenderness. There is no rebound and no guarding.  Musculoskeletal: Normal range of motion. He exhibits no tenderness.  Lymphadenopathy:    He has no cervical adenopathy.  Neurological: He is alert and oriented to person, place, and time.  The patient is alert, attentive, and oriented x 3. Speech is clear. Cranial nerve II-VII grossly intact except for 3 and 6. Patient unable to adduct or abduct eye with lateral gaze. Normal upward and downward gaze. Negative pronator drift. Sensation intact. Strength 5/5 in all extremities. Reflexes 2+ and symmetric at biceps, triceps, knees, and ankles. Rapid alternating movement and fine finger  movements intact. Romberg is absent. Posture and gait normal.   Skin: Skin is warm and dry. Capillary refill takes less than 2 seconds. No rash noted.  Psychiatric: His behavior is normal. Judgment and thought content normal.  Nursing note and vitals reviewed.    ED Treatments / Results  Labs (all labs ordered are listed, but only abnormal results are displayed) Labs Reviewed  COMPREHENSIVE METABOLIC PANEL - Abnormal; Notable for the following:       Result Value   Total Bilirubin 1.5 (*)    All other components within normal limits  I-STAT CHEM 8, ED - Abnormal; Notable for the following:    BUN 22 (*)    Glucose, Bld 100 (*)    Calcium, Ion 1.07 (*)    All other components within normal limits  PROTIME-INR  APTT  CBC  DIFFERENTIAL  I-STAT TROPONIN, ED  CBG MONITORING, ED    EKG  EKG Interpretation None       Radiology Ct Head Wo Contrast  Result Date: 07/17/2017 CLINICAL DATA:  Double vision for 1 week. EXAM: CT HEAD WITHOUT CONTRAST TECHNIQUE: Contiguous axial images were obtained from the base of the skull through the vertex without intravenous contrast. COMPARISON:  None. FINDINGS: Brain: No evidence for acute hemorrhage, mass lesion, midline shift, hydrocephalus or large infarct. Focal low-density in the right parietal lobe is suggestive for an old insult. There is subtle low-density in the left frontal white matter. Subtle areas of low-density in the white matter suggesting chronic changes. Vascular: No hyperdense vessel or unexpected calcification. Skull: Normal. Negative for fracture or focal lesion. Sinuses/Orbits: Polyp or retention cyst in the left maxillary sinus. Otherwise, the visualized paranasal sinuses are aerated. Other: Fat attenuating soft tissue structure along the left frontal scalp. 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 IMPRESSION: No acute intracranial abnormality. Old insult in the right parietal lobe. Nonspecific white matter changes  may represent chronic small vessel ischemic disease. Electronically Signed   By: Richarda Overlie M.D.   On: 07/17/2017 14:13    Procedures Procedures (including critical care time)  Medications Ordered in ED Medications - No data to display   Initial Impression / Assessment and Plan / ED Course  I have reviewed the triage vital signs and the nursing notes.  Pertinent labs & imaging results that were available during my care of the patient were reviewed by me and considered in my medical decision making (see chart for details).     Patient resents to the ED after being referred from ophthalmology for diplopia and 3/6 cranial nerve palsy. Patient has no other focal neuro deficit on exam. Medical history significant for hypertension.  Lab work is reassuring. CT scan of head was ordered in triage was unremarkable for any acute findings.  Discussed with Dr. Marrion Coy with neurology. He recommends that patient receive MRI brain with and without contrast. Will see patient in consultation the ED.  Patient is currently waiting for neurology consult and MRI of brain.  Care handoff to PA [facility]. Pt has pending at this time MRI brain and neurology consult.  Care dicussed and plan agreed upon with oncoming PA. Pt updated on plan of care and is currently hemodynamically stable at this time with normal vs.    Cased dicussed with Dr. Deretha Emory.    Final Clinical Impressions(s) / ED Diagnoses   Final diagnoses:  None    New Prescriptions New Prescriptions   No medications on file     Wallace Keller 07/17/17 1956    Vanetta Mulders, MD 07/21/17 [phone]

## 2017-07-17 NOTE — ED Notes (Signed)
PA at bedside.

## 2017-07-17 NOTE — Consult Note (Signed)
Neurology Consult Referring physician: Gloris Manchester  Chief Complaint: Diplopia for one week.  HPI: Nicholas Kelly. is an 51 y.o. male with a history of hypertension, hyperlipidemia and CHF referred to the ED by ophthalmology for evaluation to rule out acute stroke. Patient has been experiencing diplopia about 1 week. Diplopia is more pronounced when he looks either right or left. He's had only slight symptoms of disequilibrium when he stands. There's been no change in speech and no facial droop. He said no weakness or numbness of lower extremities. 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 MRI is pending.  LSN: One week ago tPA Given: No: Beyond time window for treatment consideration mRankin:  Past Medical History:  Diagnosis Date  . Acute CHF (congestive heart failure) ([place]) 02/10/2017  . Dyspnea   . Hypertension     Past Surgical History:  Procedure Laterality Date  . NO PAST SURGERIES    . RIGHT/LEFT HEART CATH AND CORONARY ANGIOGRAPHY N/A 02/12/2017   Procedure: Right/Left Heart Cath and Coronary Angiography;  Surgeon: Belva Crome, MD;  Location: [facility] CV LAB;  Service: Cardiovascular;  Laterality: N/A;    Family History  Problem Relation Age of Onset  . Hypertension Mother   . Cardiomyopathy Mother    Social History:  reports that he quit smoking about 12 months ago. His smoking use included Cigarettes. He has a 34.00 pack-year smoking history. He has never used smokeless tobacco. He reports that he drinks about 15.6 oz of alcohol per week . He reports that he uses drugs.  Allergies: No Known Allergies  Medications: Preadmission medications were reviewed by me.  ROS: History obtained from the patient  General ROS: negative for - chills, fatigue, fever, night sweats, weight gain or weight loss Psychological ROS: negative for - behavioral disorder, hallucinations, memory difficulties, mood swings or  suicidal ideation Ophthalmic ROS: negative for - blurry vision, double vision, eye pain or loss of vision ENT ROS: negative for - epistaxis, nasal discharge, oral lesions, sore throat, tinnitus or vertigo Allergy and Immunology ROS: negative for - hives or itchy/watery eyes Hematological and Lymphatic ROS: negative for - bleeding problems, bruising or swollen lymph nodes Endocrine ROS: negative for - galactorrhea, hair pattern changes, polydipsia/polyuria or temperature intolerance Respiratory ROS: negative for - cough, hemoptysis, shortness of breath or wheezing Cardiovascular ROS: negative for - chest pain, dyspnea on exertion, edema or irregular heartbeat Gastrointestinal ROS: negative for - abdominal pain, diarrhea, hematemesis, nausea/vomiting or stool incontinence Genito-Urinary ROS: negative for - dysuria, hematuria, incontinence or urinary frequency/urgency Musculoskeletal ROS: negative for - joint swelling or muscular weakness Neurological ROS: as noted in HPI Dermatological ROS: negative for rash and skin lesion changes  Physical Examination: Blood pressure (!) 139/93, pulse (!) 54, temperature 98.2 F (36.8 C), resp. rate 16, SpO2 95 %.  HEENT-  Normocephalic, no lesions, without obvious abnormality.  Normal external eye and conjunctiva.  Normal TM's bilaterally.  Normal auditory canals and external ears. Normal external nose, mucus membranes and septum.  Normal pharynx. Neck supple with no masses, nodes, nodules or enlargement. Cardiovascular - regular rate and rhythm, S1, S2 normal, no murmur, click, rub or gallop Lungs - chest clear, no wheezing, rales, normal symmetric air entry Abdomen - soft, non-tender; bowel sounds normal; no masses,  no organomegaly Extremities - no joint deformities, effusion, or inflammation  Neurologic Examination: Mental Status: Alert, oriented, thought content appropriate.  Speech fluent without evidence of  aphasia. Able to follow commands without  difficulty. Cranial Nerves: II-Visual fields were normal. III/IV/VI-Pupils were equal and reacted normally to light. Extraocular movements were markedly abnormal with inability to abduct and adduct right eye lateral gaze and inability to abduct left eye on lateral gaze. Abduction of left eye was intact on right lateral gaze. Upgaze and downgaze were intact bilaterally. There was no nystagmus in any direction of gaze.   V/VII-no facial numbness and no facial weakness. VIII-normal. X-normal speech and symmetrical palatal movement. XI: trapezius strength/neck flexion strength normal bilaterally XII-midline tongue extension with normal strength. Motor: 5/5 bilaterally with normal tone and bulk Sensory: Normal throughout. Deep Tendon Reflexes: 1+ and symmetric. Plantars: Mute bilaterally Cerebellar: Normal finger-to-nose testing. Carotid auscultation: Normal  Results for orders placed or performed during the hospital encounter of 07/17/17 (from the past 48 hour(s))  Protime-INR     Status: None   Collection Time: 07/17/17  1:20 PM  Result Value Ref Range   Prothrombin Time 14.8 11.4 - 15.2 seconds   INR 1.17   APTT     Status: None   Collection Time: 07/17/17  1:20 PM  Result Value Ref Range   aPTT 30 24 - 36 seconds  CBC     Status: None   Collection Time: 07/17/17  1:20 PM  Result Value Ref Range   WBC 9.1 4.0 - 10.5 K/uL   RBC 4.36 4.22 - 5.81 MIL/uL   Hemoglobin 14.4 13.0 - 17.0 g/dL   HCT 42.8 39.0 - 52.0 %   MCV 98.2 78.0 - 100.0 fL   MCH 33.0 26.0 - 34.0 pg   MCHC 33.6 30.0 - 36.0 g/dL   RDW 11.9 11.5 - 15.5 %   Platelets 241 150 - 400 K/uL  Differential     Status: None   Collection Time: 07/17/17  1:20 PM  Result Value Ref Range   Neutrophils Relative % 61 %   Neutro Abs 5.5 1.7 - 7.7 K/uL   Lymphocytes Relative 26 %   Lymphs Abs 2.4 0.7 - 4.0 K/uL   Monocytes Relative 10 %   Monocytes Absolute 0.9 0.1 - 1.0 K/uL   Eosinophils Relative 3 %   Eosinophils Absolute  0.3 0.0 - 0.7 K/uL   Basophils Relative 0 %   Basophils Absolute 0.0 0.0 - 0.1 K/uL  Comprehensive metabolic panel     Status: Abnormal   Collection Time: 07/17/17  1:20 PM  Result Value Ref Range   Sodium 135 135 - 145 mmol/L   Potassium 4.2 3.5 - 5.1 mmol/L   Chloride 103 101 - 111 mmol/L   CO2 25 22 - 32 mmol/L   Glucose, Bld 99 65 - 99 mg/dL   BUN 17 6 - 20 mg/dL   Creatinine, Ser 0.95 0.61 - 1.24 mg/dL   Calcium 8.9 8.9 - 10.3 mg/dL   Total Protein 7.3 6.5 - 8.1 g/dL   Albumin 3.7 3.5 - 5.0 g/dL   AST 33 15 - 41 U/L   ALT 26 17 - 63 U/L   Alkaline Phosphatase 53 38 - 126 U/L   Total Bilirubin 1.5 (H) 0.3 - 1.2 mg/dL   GFR calc non Af Amer >60 >60 mL/min   GFR calc Af Amer >60 >60 mL/min    Comment: (NOTE) The eGFR has been calculated using the CKD EPI equation. This calculation has not been validated in all clinical situations. eGFR's persistently <60 mL/min signify possible Chronic Kidney Disease.    Anion gap 7  5 - 15  I-stat troponin, ED     Status: None   Collection Time: 07/17/17  1:32 PM  Result Value Ref Range   Troponin i, poc 0.04 0.00 - 0.08 ng/mL   Comment 3            Comment: Due to the release kinetics of cTnI, a negative result within the first hours of the onset of symptoms does not rule out myocardial infarction with certainty. If myocardial infarction is still suspected, repeat the test at appropriate intervals.   I-Stat Chem 8, ED     Status: Abnormal   Collection Time: 07/17/17  1:34 PM  Result Value Ref Range   Sodium 140 135 - 145 mmol/L   Potassium 4.1 3.5 - 5.1 mmol/L   Chloride 103 101 - 111 mmol/L   BUN 22 (H) 6 - 20 mg/dL   Creatinine, Ser 0.90 0.61 - 1.24 mg/dL   Glucose, Bld 100 (H) 65 - 99 mg/dL   Calcium, Ion 1.07 (L) 1.15 - 1.40 mmol/L   TCO2 27 22 - 32 mmol/L   Hemoglobin 15.0 13.0 - 17.0 g/dL   HCT 44.0 39.0 - 52.0 %   Ct Head Wo Contrast  Result Date: 07/17/2017 CLINICAL DATA:  Double vision for 1 week. EXAM: CT HEAD  WITHOUT CONTRAST TECHNIQUE: Contiguous axial images were obtained from the base of the skull through the vertex without intravenous contrast. COMPARISON:  None. FINDINGS: Brain: No evidence for acute hemorrhage, mass lesion, midline shift, hydrocephalus or large infarct. Focal low-density in the right parietal lobe is suggestive for an old insult. There is subtle low-density in the left frontal white matter. Subtle areas of low-density in the white matter suggesting chronic changes. Vascular: No hyperdense vessel or unexpected calcification. Skull: Normal. Negative for fracture or focal lesion. Sinuses/Orbits: Polyp or retention cyst in the left maxillary sinus. Otherwise, the visualized paranasal sinuses are aerated. Other: Fat attenuating soft tissue structure along the left frontal scalp. 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 IMPRESSION: No acute intracranial abnormality. Old insult in the right parietal lobe. Nonspecific white matter changes may represent chronic small vessel ischemic disease. Electronically Signed   By: Markus Daft M.D.   On: 07/17/2017 14:13    Assessment: 51 y.o. 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  Stroke Risk Factors - hyperlipidemia and hypertension  Plan: 1. HgbA1c, fasting lipid panel 2. MRI, MRA  of the brain without contrast 3. PT consult, OT 4. Echocardiogram 5. Carotid dopplers 6. Prophylactic therapy-Antiplatelet med: Aspirin 7. Risk factor modification 8. Telemetry monitoring  C.R. Nicole Kindred, MD [facility] [phone]  07/17/2017, 9:41 PM

## 2017-07-17 NOTE — ED Provider Notes (Signed)
10:30 PM Patient care assumed from K. Azucena Kuba, PA-C at shift change. Patient presenting for diplopia x 2 weeks with abnormal EOMs c/w evidence of 3rd and 6th CN palsy. Question WEBINO vs acute/subacute pontine stroke. Patient pending MRI at this time. He has been evaluated by neurology who recommend admission on telemetry for further work up including carotid dopplers and echocardiogram.  11:10 PM MRI negative. Plan to proceed with admission, per Neurology recommendation.   Results for orders placed or performed during the hospital encounter of 07/17/17  Protime-INR  Result Value Ref Range   Prothrombin Time 14.8 11.4 - 15.2 seconds   INR 1.17   APTT  Result Value Ref Range   aPTT 30 24 - 36 seconds  CBC  Result Value Ref Range   WBC 9.1 4.0 - 10.5 K/uL   RBC 4.36 4.22 - 5.81 MIL/uL   Hemoglobin 14.4 13.0 - 17.0 g/dL   HCT 16.1 09.6 - 04.5 %   MCV 98.2 78.0 - 100.0 fL   MCH 33.0 26.0 - 34.0 pg   MCHC 33.6 30.0 - 36.0 g/dL   RDW 40.9 81.1 - 91.4 %   Platelets 241 150 - 400 K/uL  Differential  Result Value Ref Range   Neutrophils Relative % 61 %   Neutro Abs 5.5 1.7 - 7.7 K/uL   Lymphocytes Relative 26 %   Lymphs Abs 2.4 0.7 - 4.0 K/uL   Monocytes Relative 10 %   Monocytes Absolute 0.9 0.1 - 1.0 K/uL   Eosinophils Relative 3 %   Eosinophils Absolute 0.3 0.0 - 0.7 K/uL   Basophils Relative 0 %   Basophils Absolute 0.0 0.0 - 0.1 K/uL  Comprehensive metabolic panel  Result Value Ref Range   Sodium 135 135 - 145 mmol/L   Potassium 4.2 3.5 - 5.1 mmol/L   Chloride 103 101 - 111 mmol/L   CO2 25 22 - 32 mmol/L   Glucose, Bld 99 65 - 99 mg/dL   BUN 17 6 - 20 mg/dL   Creatinine, Ser 7.82 0.61 - 1.24 mg/dL   Calcium 8.9 8.9 - 95.6 mg/dL   Total Protein 7.3 6.5 - 8.1 g/dL   Albumin 3.7 3.5 - 5.0 g/dL   AST 33 15 - 41 U/L   ALT 26 17 - 63 U/L   Alkaline Phosphatase 53 38 - 126 U/L   Total Bilirubin 1.5 (H) 0.3 - 1.2 mg/dL   GFR calc non Af Amer >60 >60 mL/min   GFR calc  Af Amer >60 >60 mL/min   Anion gap 7 5 - 15  I-stat troponin, ED  Result Value Ref Range   Troponin i, poc 0.04 0.00 - 0.08 ng/mL   Comment 3          I-Stat Chem 8, ED  Result Value Ref Range   Sodium 140 135 - 145 mmol/L   Potassium 4.1 3.5 - 5.1 mmol/L   Chloride 103 101 - 111 mmol/L   BUN 22 (H) 6 - 20 mg/dL   Creatinine, Ser 2.13 0.61 - 1.24 mg/dL   Glucose, Bld 086 (H) 65 - 99 mg/dL   Calcium, Ion 5.78 (L) 1.15 - 1.40 mmol/L   TCO2 27 22 - 32 mmol/L   Hemoglobin 15.0 13.0 - 17.0 g/dL   HCT 46.9 62.9 - 52.8 %   Ct Head Wo Contrast  Result Date: 07/17/2017 CLINICAL DATA:  Double vision for 1 week. EXAM: CT HEAD WITHOUT CONTRAST TECHNIQUE: Contiguous axial images were obtained from  the base of the skull through the vertex without intravenous contrast. COMPARISON:  None. FINDINGS: Brain: No evidence for acute hemorrhage, mass lesion, midline shift, hydrocephalus or large infarct. Focal low-density in the right parietal lobe is suggestive for an old insult. There is subtle low-density in the left frontal white matter. Subtle areas of low-density in the white matter suggesting chronic changes. Vascular: No hyperdense vessel or unexpected calcification. Skull: Normal. Negative for fracture or focal lesion. Sinuses/Orbits: Polyp or retention cyst in the left maxillary sinus. Otherwise, the visualized paranasal sinuses are aerated. Other: Fat attenuating soft tissue structure along the left frontal scalp. 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 IMPRESSION: No acute intracranial abnormality. Old insult in the right parietal lobe. Nonspecific white matter changes may represent chronic small vessel ischemic disease. Electronically Signed   By: Richarda Overlie M.D.   On: 07/17/2017 14:13   Mr Brain W And Wo Contrast  Result Date: 07/17/2017 CLINICAL DATA:  Diplopia EXAM: MRI HEAD WITHOUT AND WITH CONTRAST TECHNIQUE: Multiplanar, multiecho pulse sequences of the brain and surrounding  structures were obtained without and with intravenous contrast. CONTRAST:  18mL MULTIHANCE GADOBENATE DIMEGLUMINE 529 MG/ML IV SOLN COMPARISON:  Head CT 07/17/2017 FINDINGS: Brain: The midline structures are normal. There is no focal diffusion restriction to indicate acute infarct. There is beginning confluent hyperintense T2-weighted signal within the brainstem and the periventricular and deep white matter, most often seen in the setting of chronic microvascular ischemia. No intraparenchymal hematoma or chronic microhemorrhage. Brain volume is normal for age without lobar predominant atrophy. The dura is normal and there is no extra-axial collection. Vascular: Major intracranial arterial and venous sinus flow voids are preserved. Skull and upper cervical spine: The visualized skull base, calvarium, upper cervical spine are normal. Lipoma of the left frontal scalp. Sinuses/Orbits: No fluid levels or advanced mucosal thickening. No mastoid or middle ear effusion. Normal orbits. IMPRESSION: 1. No acute abnormality. 2. Chronic ischemic microangiopathy. Electronically Signed   By: Deatra Robinson M.D.   On: 07/17/2017 22:58      Antony Madura, PA-C 07/17/17 2326    Vanetta Mulders, MD 07/21/17 [phone]

## 2017-07-18 ENCOUNTER — Observation Stay (HOSPITAL_BASED_OUTPATIENT_CLINIC_OR_DEPARTMENT_OTHER): Payer: Self-pay

## 2017-07-18 ENCOUNTER — Other Ambulatory Visit (HOSPITAL_COMMUNITY): Payer: Self-pay

## 2017-07-18 ENCOUNTER — Encounter (HOSPITAL_COMMUNITY): Payer: Self-pay | Admitting: Radiology

## 2017-07-18 ENCOUNTER — Observation Stay (HOSPITAL_COMMUNITY): Payer: Self-pay

## 2017-07-18 DIAGNOSIS — I10 Essential (primary) hypertension: Secondary | ICD-10-CM

## 2017-07-18 DIAGNOSIS — I429 Cardiomyopathy, unspecified: Secondary | ICD-10-CM

## 2017-07-18 DIAGNOSIS — E785 Hyperlipidemia, unspecified: Secondary | ICD-10-CM

## 2017-07-18 DIAGNOSIS — I5042 Chronic combined systolic (congestive) and diastolic (congestive) heart failure: Secondary | ICD-10-CM

## 2017-07-18 DIAGNOSIS — I6312 Cerebral infarction due to embolism of basilar artery: Secondary | ICD-10-CM

## 2017-07-18 DIAGNOSIS — H539 Unspecified visual disturbance: Secondary | ICD-10-CM

## 2017-07-18 DIAGNOSIS — I5030 Unspecified diastolic (congestive) heart failure: Secondary | ICD-10-CM

## 2017-07-18 LAB — LIPID PANEL
CHOLESTEROL: 163 mg/dL (ref 0–200)
HDL: 33 mg/dL — AB (ref >40)
LDL Cholesterol: 119 mg/dL — ABNORMAL HIGH (ref 0–99)
TRIGLYCERIDES: 56 mg/dL (ref <150)
Total CHOL/HDL Ratio: 4.9 RATIO
VLDL: 11 mg/dL (ref 0–40)

## 2017-07-18 LAB — HEMOGLOBIN A1C
Hgb A1c MFr Bld: 5 % (ref 4.8–5.6)
Mean Plasma Glucose: 96.8 mg/dL

## 2017-07-18 MED ORDER — ATORVASTATIN CALCIUM 80 MG PO TABS
80.0000 mg | ORAL_TABLET | Freq: Every day | ORAL | Status: DC
  Administered 2017-07-18: 80 mg via ORAL
  Filled 2017-07-18: qty 1

## 2017-07-18 MED ORDER — PERFLUTREN LIPID MICROSPHERE
1.0000 mL | INTRAVENOUS | Status: AC | PRN
  Administered 2017-07-18: 2 mL via INTRAVENOUS
  Filled 2017-07-18: qty 10

## 2017-07-18 MED ORDER — IOPAMIDOL (ISOVUE-370) INJECTION 76%
INTRAVENOUS | Status: AC
  Administered 2017-07-18: 50 mL
  Filled 2017-07-18: qty 50

## 2017-07-18 NOTE — Progress Notes (Signed)
Blue phone ringing at desk as I approached nursing station. Ivin Booty from [employer] called and stated leads were off on patient. I went into patients room and reconnected 2 leads, phoned Ivin Booty and verified he had a reading.

## 2017-07-18 NOTE — Discharge Instructions (Signed)
Visual Disturbances A visual disturbance is any problem that interferes with your normal vision. You can have a visual disturbance in one eye or both eyes. Some types of visual disturbances come and go without treatment and do not cause a permanent problem. Other visual disturbances may be a sign of a serious medical emergency. There are many signs and symptoms of a visual disturbance, including:  Blurred vision.  Inability to see certain colors.  Seeing floating spots (floaters).  Light sensitivity.  Flashing or shimmering lights.  Zigzagging lines or stars.  Seeing the floor as tilted (visual midline shift).  Being unaware of objects on one side of the body (visual spatial inattention).  Double vision.  Rhythmic, involuntary eye movements (nystagmus).  Hallucinations.  Temporary or permanent blindness.  Follow these instructions at home:  Take over-the-counter and prescription medicines only as told by your health care provider.  To lower your risk of the problems that can lead to visual disturbances: ? Eat a healthy diet. ? Maintain a healthy weight. Lose weight if you need to. ? Exercise regularly. Ask your health care provider what activities are safe for you.  Avoid migraine triggers when possible.  Keep all follow-up visits as told by your health care provider. This is important. Contact a health care provider if:  Your visual disturbance changes or becomes worse.  You notice any new visual disturbances. Get help right away if:  You lose most or all of your vision in one eye or both eyes.  You experience visual hallucinations.  You have chest pain, nausea, or vomiting along with visual disturbances. This information is not intended to replace advice given to you by your health care provider. Make sure you discuss any questions you have with your health care provider. Document Released: 11/19/2004 Document Revised: 03/25/2016 Document Reviewed:  03/21/2014 Elsevier Interactive Patient Education  2018 [employer] Inc[employer]  

## 2017-07-18 NOTE — Progress Notes (Signed)
Received written and verbal report from [place], [place]. Introduced myself to patient. Pleasant, cooperative and articulate, patient denies pain. Does state that his vision is aggravating, in that it is blurry. Reports seeing clear if he covers one or the other eye. As I was leaving the room, family was coming in. Safety maintained. Will continue to monitor.

## 2017-07-18 NOTE — Progress Notes (Signed)
Pt arrived to unit. Alert and oriented X 4. No c/o pain or discomfort. Oriented to unit. Will continue to monitor.

## 2017-07-18 NOTE — Progress Notes (Signed)
PROGRESS NOTE    Nicholas Kelly.  NBZ:[id number] DOB: Jun 12, 1966 DOA: 07/17/2017 PCP: Patient, No Pcp Per   Chief Complaint  Patient presents with  . Blurred Vision    Brief Narrative:  On 07/17/2017 by Dr. Nancie Neas Jr.is a 51 y.o.malewith medical history significant for hypertension, obesity, and nonischemic cardiomyopathy with EF 20-25%, now presenting to the emergency department for evaluation of visual disturbance for the past week. Patient reports diplopia for at least one week, worse with gazing laterally to either side. His family is noted that his gait has been on stable over the same interval. Denies headache, change in hearing, numbness, or weakness. No chest pain, no fevers or chills, and no nausea or vomiting. No recent head trauma or fall. Patient saw his ophthalmologist for evaluation of these findings and was recommended to proceed to the emergency department for evaluation of possible stroke. Assessment & Plan   Visual disturbance -unknown etiology  -Patient presented to his optometrist with one-week history of visual disturbance, and was referred to the hospital to rule out CVA -CT head negative for acute cranial abnormality -MRI brain showed no acute abnormality, chronic ischemic microangiopathy. Confluent hyperintense T2-weighted signal within the brainstem and periventricular and deep white matter- chronic microvascular -CTA head and neck: Negative CTA, minimal calcified atherosclerosis affecting only the carotids, mild vessel tortuosity/ectasia -Neurology consulted and appreciated -LDL 119, hemoglobin A1c 5 -Continue aspirin, statin -Discussed with Dr. Roda Shutters, ?MS vs old stroke that may have occurred 2 weeks ago. Given that patient has and EF 25%, this lends itself to CVA, and patient may need to be on anticoagulation. -Pending Echocardiogram  Chronic combined systolic and diastolic heart failure -Echocardiogram in April 2018 showed EF  20-25%, grade 3 diastolic dysfunction -Currently appears to be euvolemic and compensated -Continue home medications: Aldactone, Lasix, Coreg, lisinopril  Essential hypertension -BP appears to be stable -Continue diuretics, Coreg, lisinopril  DVT Prophylaxis  Lovenox  Code Status: Full  Family Communication: None at bedside  Disposition Plan: Observation  Consultants Neurology  Procedures  Echocardiogram  Antibiotics   Anti-infectives    None      Subjective:   Nicholas Kelly seen and examined today.  Continues to have blurry vision. Denies chest pain, shortness of breath, abdominal pain, N/V/D/C, dizziness, headache.  Objective:   Vitals:   07/18/17 0555 07/18/17 0915 07/18/17 1056 07/18/17 1434  BP: (!) 158/102 133/78 132/69 114/64  Pulse: 62 61 70 71  Resp: 18 18  18   Temp: 99 F (37.2 C) 98.5 F (36.9 C)  98.7 F (37.1 C)  TempSrc: Oral Oral  Oral  SpO2: 96% 97%  95%  Weight:      Height:       No intake or output data in the 24 hours ending 07/18/17 1631 Filed Weights   07/17/17 2315 07/18/17 0108  Weight: (!) 137 kg (302 lb) (!) 136.9 kg (301 lb 12.8 oz)    Exam  General: Well developed, well nourished, NAD, appears stated age  HEENT: NCAT, mucous membranes moist.   Cardiovascular: S1 S2 auscultated, 1/6 SEM, Regular rate and rhythm.  Respiratory: Clear to auscultation bilaterally with equal chest rise  Abdomen: Soft, obese, nontender, nondistended, + bowel sounds  Extremities: warm dry without cyanosis clubbing or edema  Neuro: AAOx3, Cannot abduct and adduct right eye, cannot abduct left eye   Psych: Normal affect and demeanor with intact judgement and insight   Data Reviewed: I have personally reviewed following labs  and imaging studies  CBC:  Recent Labs Lab 07/17/17 1320 07/17/17 1334  WBC 9.1  --   NEUTROABS 5.5  --   HGB 14.4 15.0  HCT 42.8 44.0  MCV 98.2  --   PLT 241  --    Basic Metabolic Panel:  Recent  Labs Lab 07/17/17 1320 07/17/17 1334  NA 135 140  K 4.2 4.1  CL 103 103  CO2 25  --   GLUCOSE 99 100*  BUN 17 22*  CREATININE 0.95 0.90  CALCIUM 8.9  --    GFR: Estimated Creatinine Clearance: 130.1 mL/min (by C-G formula based on SCr of 0.9 mg/dL). Liver Function Tests:  Recent Labs Lab 07/17/17 1320  AST 33  ALT 26  ALKPHOS 53  BILITOT 1.5*  PROT 7.3  ALBUMIN 3.7   No results for input(s): LIPASE, AMYLASE in the last 168 hours. No results for input(s): AMMONIA in the last 168 hours. Coagulation Profile:  Recent Labs Lab 07/17/17 1320  INR 1.17   Cardiac Enzymes: No results for input(s): CKTOTAL, CKMB, CKMBINDEX, TROPONINI in the last 168 hours. BNP (last 3 results) No results for input(s): PROBNP in the last 8760 hours. HbA1C:  Recent Labs  07/18/17 0558  HGBA1C 5.0   CBG: No results for input(s): GLUCAP in the last 168 hours. Lipid Profile:  Recent Labs  07/18/17 0558  CHOL 163  HDL 33*  LDLCALC 119*  TRIG 56  CHOLHDL 4.9   Thyroid Function Tests: No results for input(s): TSH, T4TOTAL, FREET4, T3FREE, THYROIDAB in the last 72 hours. Anemia Panel: No results for input(s): VITAMINB12, FOLATE, FERRITIN, TIBC, IRON, RETICCTPCT in the last 72 hours. Urine analysis: No results found for: COLORURINE, APPEARANCEUR, LABSPEC, PHURINE, GLUCOSEU, HGBUR, BILIRUBINUR, KETONESUR, PROTEINUR, UROBILINOGEN, NITRITE, LEUKOCYTESUR Sepsis Labs: (procalcitonin:4,lacticidven:4)  )No results found for this or any previous visit (from the past 240 hour(s)).    Radiology Studies: Ct Angio Head W Or Wo Contrast  Result Date: 07/18/2017 CLINICAL DATA:  51 year old male with Diplopia. EXAM: CT ANGIOGRAPHY HEAD AND NECK TECHNIQUE: Multidetector CT imaging of the head and neck was performed using the standard protocol during bolus administration of intravenous contrast. Multiplanar CT image reconstructions and MIPs were obtained to evaluate the vascular  anatomy. Carotid stenosis measurements (when applicable) are obtained utilizing NASCET criteria, using the distal internal carotid diameter as the denominator. CONTRAST:  50 mL Isovue 370 COMPARISON:  Brain MRI and noncontrast head CT 07/17/2017 FINDINGS: CTA NECK Skeleton: Poor dentition.  No acute osseous abnormality identified. Upper chest: No superior mediastinal lymphadenopathy. Negative lung apices. Other neck: Negative; no cervical lymphadenopathy. Left scalp lipoma re- demonstrated. Aortic arch: Bovine type arch configuration. No significant arch atherosclerosis or great vessel origin stenosis. Right carotid system: Tortuous proximal right CCA. Mild soft plaque at the right carotid bifurcation primarily affects the ECA. Cervical right ICA tortuosity but no stenosis. Left carotid system: Bovine type left CCA origin with no plaque or stenosis. Minimal calcified plaque at the left carotid bifurcation affecting the ECA. Cervical left ICA is tortuous but without stenosis. Vertebral arteries:No proximal subclavian artery plaque or stenosis. Normal vertebral artery origins. Codominant vertebral arteries are normal to the skullbase. CTA HEAD Posterior circulation: Normal distal vertebral arteries. Normal PICA origins and vertebrobasilar junction. Normal basilar artery. Normal SCA and PCA origins. Posterior communicating arteries are diminutive or absent. Bilateral PCA branches appear normal. Anterior circulation: Patent ICA siphons with minimal calcified plaque and no stenosis. Mild right siphon dolichoectasia. Normal ophthalmic artery origins. Patent  carotid termini. Normal MCA and ACA origins. Dominant right ACA A1 segment. Anterior communicating artery and bilateral ACA branches are within normal limits. Left MCA M1 segment, bifurcation, and left MCA branches are within normal limits. Right MCA M1 segment, bifurcation, and right MCA branches are within normal limits. Venous sinuses: Patent. Anatomic variants:  Dominant right ACA A1 segment. Bovine type arch configuration. Delayed phase: Stable gray-white matter differentiation throughout the brain. Right parietal lobe cortical encephalomalacia. No abnormal enhancement identified. Review of the MIP images confirms the above findings IMPRESSION: 1. Negative CTA Head and Neck; - Minimal calcified atherosclerosis affecting only the carotids. - Mild vessel tortuosity/ectasia. 2.  Stable CT appearance of the brain. 3. Poor dentition. Electronically Signed   By: Odessa Fleming M.D.   On: 07/18/2017 10:11   Ct Head Wo Contrast  Result Date: 07/17/2017 CLINICAL DATA:  Double vision for 1 week. EXAM: CT HEAD WITHOUT CONTRAST TECHNIQUE: Contiguous axial images were obtained from the base of the skull through the vertex without intravenous contrast. COMPARISON:  None. FINDINGS: Brain: No evidence for acute hemorrhage, mass lesion, midline shift, hydrocephalus or large infarct. Focal low-density in the right parietal lobe is suggestive for an old insult. There is subtle low-density in the left frontal white matter. Subtle areas of low-density in the white matter suggesting chronic changes. Vascular: No hyperdense vessel or unexpected calcification. Skull: Normal. Negative for fracture or focal lesion. Sinuses/Orbits: Polyp or retention cyst in the left maxillary sinus. Otherwise, the visualized paranasal sinuses are aerated. Other: Fat attenuating soft tissue structure along the left frontal scalp. 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 IMPRESSION: No acute intracranial abnormality. Old insult in the right parietal lobe. Nonspecific white matter changes may represent chronic small vessel ischemic disease. Electronically Signed   By: Richarda Overlie M.D.   On: 07/17/2017 14:13   Ct Angio Neck W Or Wo Contrast  Result Date: 07/18/2017 CLINICAL DATA:  51 year old male with Diplopia. EXAM: CT ANGIOGRAPHY HEAD AND NECK TECHNIQUE: Multidetector CT imaging of the head and neck  was performed using the standard protocol during bolus administration of intravenous contrast. Multiplanar CT image reconstructions and MIPs were obtained to evaluate the vascular anatomy. Carotid stenosis measurements (when applicable) are obtained utilizing NASCET criteria, using the distal internal carotid diameter as the denominator. CONTRAST:  50 mL Isovue 370 COMPARISON:  Brain MRI and noncontrast head CT 07/17/2017 FINDINGS: CTA NECK Skeleton: Poor dentition.  No acute osseous abnormality identified. Upper chest: No superior mediastinal lymphadenopathy. Negative lung apices. Other neck: Negative; no cervical lymphadenopathy. Left scalp lipoma re- demonstrated. Aortic arch: Bovine type arch configuration. No significant arch atherosclerosis or great vessel origin stenosis. Right carotid system: Tortuous proximal right CCA. Mild soft plaque at the right carotid bifurcation primarily affects the ECA. Cervical right ICA tortuosity but no stenosis. Left carotid system: Bovine type left CCA origin with no plaque or stenosis. Minimal calcified plaque at the left carotid bifurcation affecting the ECA. Cervical left ICA is tortuous but without stenosis. Vertebral arteries:No proximal subclavian artery plaque or stenosis. Normal vertebral artery origins. Codominant vertebral arteries are normal to the skullbase. CTA HEAD Posterior circulation: Normal distal vertebral arteries. Normal PICA origins and vertebrobasilar junction. Normal basilar artery. Normal SCA and PCA origins. Posterior communicating arteries are diminutive or absent. Bilateral PCA branches appear normal. Anterior circulation: Patent ICA siphons with minimal calcified plaque and no stenosis. Mild right siphon dolichoectasia. Normal ophthalmic artery origins. Patent carotid termini. Normal MCA and ACA origins.  Dominant right ACA A1 segment. Anterior communicating artery and bilateral ACA branches are within normal limits. Left MCA M1 segment,  bifurcation, and left MCA branches are within normal limits. Right MCA M1 segment, bifurcation, and right MCA branches are within normal limits. Venous sinuses: Patent. Anatomic variants: Dominant right ACA A1 segment. Bovine type arch configuration. Delayed phase: Stable gray-white matter differentiation throughout the brain. Right parietal lobe cortical encephalomalacia. No abnormal enhancement identified. Review of the MIP images confirms the above findings IMPRESSION: 1. Negative CTA Head and Neck; - Minimal calcified atherosclerosis affecting only the carotids. - Mild vessel tortuosity/ectasia. 2.  Stable CT appearance of the brain. 3. Poor dentition. Electronically Signed   By: Odessa Fleming M.D.   On: 07/18/2017 10:11   [name] Wo Contrast  Result Date: 07/17/2017 CLINICAL DATA:  Diplopia EXAM: MRI HEAD WITHOUT AND WITH CONTRAST TECHNIQUE: Multiplanar, multiecho pulse sequences of the brain and surrounding structures were obtained without and with intravenous contrast. CONTRAST:  14mL MULTIHANCE GADOBENATE DIMEGLUMINE 529 MG/ML IV SOLN COMPARISON:  Head CT 07/17/2017 FINDINGS: Brain: The midline structures are normal. There is no focal diffusion restriction to indicate acute infarct. There is beginning confluent hyperintense T2-weighted signal within the brainstem and the periventricular and deep white matter, most often seen in the setting of chronic microvascular ischemia. No intraparenchymal hematoma or chronic microhemorrhage. Brain volume is normal for age without lobar predominant atrophy. The dura is normal and there is no extra-axial collection. Vascular: Major intracranial arterial and venous sinus flow voids are preserved. Skull and upper cervical spine: The visualized skull base, calvarium, upper cervical spine are normal. Lipoma of the left frontal scalp. Sinuses/Orbits: No fluid levels or advanced mucosal thickening. No mastoid or middle ear effusion. Normal orbits. IMPRESSION: 1. No acute  abnormality. 2. Chronic ischemic microangiopathy. Electronically Signed   By: Deatra Robinson M.D.   On: 07/17/2017 22:58     Scheduled Meds: . aspirin  325 mg Oral Daily  . atorvastatin  80 mg Oral q1800  . carvedilol  12.5 mg Oral BID WC  . enoxaparin (LOVENOX) injection  40 mg Subcutaneous Q24H  . furosemide  40 mg Oral Daily  . lisinopril  15 mg Oral Daily  . potassium chloride SA  20 mEq Oral Daily  . spironolactone  25 mg Oral Daily   Continuous Infusions:   LOS: 0 days   Time Spent in minutes   30 minutes  Amaris Garrette D.O. on 07/18/2017 at 4:31 PM  Between 7am to 7pm - Pager - [phone]  After 7pm go to [email or web address] - password [email or web address]  And look for the night coverage person covering for me after hours  [facility] Office  [phone]

## 2017-07-18 NOTE — ED Notes (Signed)
Moved otf in error.

## 2017-07-18 NOTE — Progress Notes (Signed)
  Echocardiogram 2D Echocardiogram has been performed.  Nicholas Kelly 07/18/2017, 4:51 PM

## 2017-07-18 NOTE — Care Management Note (Signed)
Case Management Note Donn Pierini RN, BSN Unit 4E-Case Manager-- 60M weekend coverage [phone]  Patient Details  Name: Nicholas Kelly. MRN: [id number] Date of Birth: [birth year]  Subjective/Objective:   Pt presented with visual disturbance                 Action/Plan: PTA pt lived at home- referral received for referral to outpt neuro rehab for outpt OT- referral sent via epic and info placed on AVS.   Expected Discharge Date:  07/18/17               Expected Discharge Plan:  OP Rehab  In-House Referral:     Discharge planning Services  CM Consult  Post Acute Care Choice:  NA Choice offered to:  NA  DME Arranged:    DME Agency:     HH Arranged:    HH Agency:     Status of Service:  In process, will continue to follow  If discussed at Long Length of Stay Meetings, dates discussed:    Discharge Disposition: home/self care   Additional Comments:  Darrold Span, RN 07/18/2017, 2:35 PM

## 2017-07-18 NOTE — Progress Notes (Signed)
STROKE TEAM PROGRESS NOTE   HISTORY OF PRESENT ILLNESS (per record) Juelz Whittenberg. is an 51 y.o. male with a history of hypertension, hyperlipidemia and CHF referred to the ED by ophthalmology for evaluation to rule out acute stroke. Patient has been experiencing diplopia about 1 week. Diplopia is more pronounced when he looks either right or left. He's had only slight symptoms of disequilibrium when he stands. There's been no change in speech and no facial droop. He said no weakness or numbness of lower extremities. 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 MRI is pending.  LSN: One week ago tPA Given: No: Beyond time window for treatment consideration mRankin:   SUBJECTIVE (INTERVAL HISTORY) No family is at the bedside.  He stated that he was cooking at [facility] party on 07/03/16 and had sudden onset blurry vision and double vision. Over the last 2 weeks, symptoms got limited improvement. CT head concerning for right old parietal infarct. Of note, he has been following with cardiology for non-ischemic cardiomyopathy with EF 20-25%.    OBJECTIVE Temp:  [98.1 F (36.7 C)-99 F (37.2 C)] 98.5 F (36.9 C) (09/23 0915) Pulse Rate:  [50-100] 61 (09/23 0915) Cardiac Rhythm: Normal sinus rhythm (09/23 0700) Resp:  [16-18] 18 (09/23 0915) BP: (125-158)/(72-102) 133/78 (09/23 0915) SpO2:  [92 %-100 %] 97 % (09/23 0915) Weight:  [301 lb 12.8 oz (136.9 kg)-302 lb (137 kg)] 301 lb 12.8 oz (136.9 kg) (09/23 0108)  CBC:   Recent Labs Lab 07/17/17 1320 07/17/17 1334  WBC 9.1  --   NEUTROABS 5.5  --   HGB 14.4 15.0  HCT 42.8 44.0  MCV 98.2  --   PLT 241  --     Basic Metabolic Panel:   Recent Labs Lab 07/17/17 1320 07/17/17 1334  NA 135 140  K 4.2 4.1  CL 103 103  CO2 25  --   GLUCOSE 99 100*  BUN 17 22*  CREATININE 0.95 0.90  CALCIUM 8.9  --     Lipid Panel:     Component Value Date/Time   CHOL 163 07/18/2017 0558    TRIG 56 07/18/2017 0558   HDL 33 (L) 07/18/2017 0558   CHOLHDL 4.9 07/18/2017 0558   VLDL 11 07/18/2017 0558   LDLCALC 119 (H) 07/18/2017 0558   HgbA1c:  Lab Results  Component Value Date   HGBA1C 5.0 07/18/2017   Urine Drug Screen: No results found for: LABOPIA, COCAINSCRNUR, LABBENZ, AMPHETMU, THCU, LABBARB  Alcohol Level No results found for: [facility]   IMAGING I have personally reviewed the radiological images below and agree with the radiology interpretations.  Ct Head Wo Contrast 07/17/2017 IMPRESSION:  No acute intracranial abnormality. Old insult in the right parietal lobe. Nonspecific white matter changes may represent chronic small vessel ischemic disease.   [name] Wo Contrast 07/17/2017 IMPRESSION:  1. No acute abnormality. However, I read with Dr. Margo Aye, Radiologist, there were encephalomalacia at bilateral pontine, right parietal region corresponding to CT findings. 2. Chronic ischemic microangiopathy.   Ct Angio Head W Or Wo Contrast Ct Angio Neck W Or Wo Contrast 07/18/2017  IMPRESSION:  1. Negative CTA Head and Neck. Minimal calcified atherosclerosis affecting only the carotids. - Mild vessel tortuosity/ectasia.  2. Stable CT appearance of the brain.  3. Poor dentition  TTE - pending   PHYSICAL EXAM Vitals:   07/18/17 0030 07/18/17 0108 07/18/17 0555 07/18/17 0915  BP: (!) 150/98  138/89 (!) 158/102 133/78  Pulse: 81 83 62 61  Resp:  Temp:  98.5 F (36.9 C) 99 F (37.2 C) 98.5 F (36.9 C)  TempSrc:  Oral Oral Oral  SpO2: 98% 98% 96% 97%  Weight:  (!) 301 lb 12.8 oz (136.9 kg)    Height:   (1.727 m)      Temp:  [98 F (36.7 C)-99 F (37.2 C)] 98 F (36.7 C) (09/23 1808) Pulse Rate:  [50-100] 79 (09/23 1808) Resp:  [16-18] 18 (09/23 1808) BP: (114-158)/(64-102) 153/94 (09/23 1808) SpO2:  [92 %-98 %] 98 % (09/23 1808) Weight:  [301 lb 12.8 oz (136.9 kg)-302 lb (137 kg)] 301 lb 12.8 oz (136.9 kg) (09/23 0108)  General -  morbid obesity, well developed, in no apparent distress.  Ophthalmologic - Fundi not visualized due to small pupils.  Cardiovascular - Regular rate and rhythm.  Mental Status -  Level of arousal and orientation to time, place, and person were intact. Language including expression, naming, repetition, comprehension was assessed and found intact. Fund of Knowledge was assessed and was intact.  Cranial Nerves II - XII - II - Visual field intact OU. III, IV, VI - disconjugate eyes, with inward deviation bilaterally. Able to move upward and downward, however, not able to have horizontal gaze bilaterally. PERRL. No nystagmus.  V - Facial sensation intact bilaterally. VII - Facial movement intact bilaterally. VIII - Hearing & vestibular intact bilaterally. X - Palate elevates symmetrically. XI - Chin turning & shoulder shrug intact bilaterally. XII - Tongue protrusion intact.  Motor Strength - The patient's strength was normal in all extremities and pronator drift was absent.  Bulk was normal and fasciculations were absent.   Motor Tone - Muscle tone was assessed at the neck and appendages and was normal.  Reflexes - The patient's reflexes were 1+ in all extremities and he had no pathological reflexes.  Sensory - Light touch, temperature/pinprick were assessed and were symmetrical.    Coordination - The patient had normal movements in the hands with no ataxia or dysmetria.  Tremor was absent.  Gait and Station - not tested   ASSESSMENT/PLAN Mr. Weber Monnier. is a 51 y.o. male with history of hypertension, hyperlipidemia and CHF presenting with diplopia and disequilibrium. He did not receive IV t-PA due to late presentation.  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 Old insult in the right parietal lobe.  MRI head - No acute abnormality. Encephalomalacia at b/l pontine and right parietal lobe  CTA H&N - negative  2D Echo - pending  LDL - 119  HgbA1c - 5.0  VTE prophylaxis  - Lovenox Diet Heart Room service appropriate? Yes; Fluid consistency: Thin  aspirin 81 mg daily prior to admission, now on aspirin 325 mg daily. Depending on the TTE findings, may consider anticoagulation with coumadin if EF < 30%.  Patient counseled to be compliant with his antithrombotic medications  Ongoing aggressive stroke risk factor management  Therapy recommendations:  Outpatient OT   Disposition: Pending  Non-ischemic cardiomyopathy  Following with cardiology   Last TTE 01/2017 EF 20-25%  On coreg, lasix, lisinopril, spironolactone at home  TTE  pending   Due to recurrent strokes, may consider anticoagulation with coumadin if EF < 30%.  Hypertension  Stable  Long-term BP goal normotensive  Hyperlipidemia  Home meds:  Lipitor 40 mg daily resumed in hospital  LDL 119, goal < 70  Increase Lipitor to 80 mg daily  Continue statin at discharge  Other Stroke Risk Factors  Former cigarette smoker - quit approximately one year ago  ETOH use, advised to drink no more than 1 - 2 drink(s) a day  Morbid obesity, Body mass index is 45.89 kg/m., recommend weight loss, diet and exercise as appropriate   Hx stroke/TIA - CT and MRI showed old insult in the right parietal lobe  Other Active Problems    Hospital day # 0  Marvel Plan, MD PhD Stroke Neurology 07/18/2017 8:03 PM    To contact Stroke Continuity provider, please refer to [email or web address]. After hours, contact General Neurology

## 2017-07-18 NOTE — Discharge Summary (Signed)
Physician Discharge Summary  Nicholas Kelly. UEA:[id number] DOB: 10-07-1966 DOA: 07/17/2017  PCP: Patient, No Pcp Per  Admit date: 07/17/2017 Discharge date: 07/19/2017  Time spent: 45 minutes  Recommendations for Outpatient Follow-up:  Patient will be discharged to home with outpatient neurology occupational therapy.  Patient will need to follow up with primary care provider within one week of discharge. May want to follow up with an ophthalmologist. Follow with Dr. Roda Shutters, neurology, in 4-6 weeks. Continue to follow-up with Dr. Tresa Endo, cardiology, at scheduled appointment. Patient should continue medications as prescribed.  Patient should follow a heart healthy diet.   Discharge Diagnoses:  Visual disturbance, possible CVA Chronic combined systolic and diastolic heart failure Essential hypertension  Discharge Condition: Stable  Diet recommendation: heart healthy  Filed Weights   07/17/17 2315 07/18/17 0108  Weight: (!) 137 kg (302 lb) (!) 136.9 kg (301 lb 12.8 oz)    History of present illness:  On 07/17/2017 by Dr. Valli Glance. is a 51 y.o. male with medical history significant for hypertension, obesity, and nonischemic cardiomyopathy with EF 20-25%, now presenting to the emergency department for evaluation of visual disturbance for the past week. Patient reports diplopia for at least one week, worse with gazing laterally to either side. His family is noted that his gait has been on stable over the same interval. Denies headache, change in hearing, numbness, or weakness. No chest pain, no fevers or chills, and no nausea or vomiting. No recent head trauma or fall. Patient saw his ophthalmologist for evaluation of these findings and was recommended to proceed to the emergency department for evaluation of possible stroke.  Hospital Course:  Visual disturbance -unknown etiology  -Patient presented to his optometrist with one-week history of visual disturbance,  and was referred to the hospital to rule out CVA -CT head negative for acute cranial abnormality -MRI brain showed no acute abnormality, chronic ischemic microangiopathy. Conclude a hyperintense T2 weighted signal within the brainstem and periventricular deep white matter. -CTA head and neck: Negative CTA, minimal calcified atherosclerosis affecting only the carotids, mild vessel tortuosity/ectasia -Echocardiogram showed an EF of 30%, grade 1 diastolic dysfunction -Neurology consulted and appreciated.  Discussed with neurology, Dr. Roda Shutters, given patient's previous echocardiogram and poor EF, this is likely a CVA and status of multiple sclerosis. Patient to follow-up with Dr. Roda Shutters in 4-6 weeks, continue aspirin and Plavix. Increasing Lipitor. -LDL 119, hemoglobin A1c 5 -Continue aspirin, statin, Plavix  Chronic combined systolic and diastolic heart failure -Echocardiogram  EF 30%, grade 1 diastolic dysfunction (improved from previous echocardiogram in April 2018-EF 20-25%, grade 3 diastolic dysfunction) -Currently appears to be euvolemic and compensated -Continue home medications: Aldactone, Lasix, Coreg, lisinopril -Continue to follow up with Dr. Tresa Endo- next appointment in October  Essential hypertension -BP appears to be stable -Continue diuretics, Coreg, lisinopril  Procedures: Echocardiogram  Consultations: Neurology  Discharge Exam: Vitals:   07/19/17 1020 07/19/17 1339  BP: 127/80 (!) 102/57  Pulse: 70 74  Resp: 18 18  Temp: 98.6 F (37 C) 98.4 F (36.9 C)  SpO2: 96% 97%   Denies chest pain, shortness of breath, abdominal pain, nausea, vomiting, diarrhea, constipation, dizziness, headache. Continues to have some visual problems   General: Well developed, well nourished, NAD, appears stated age  HEENT: NCAT, mucous membranes moist.  Cardiovascular: S1 S2 auscultated, 1/6 SEM, RRR  Respiratory: Clear to auscultation bilaterally with equal chest rise  Abdomen: Soft, obese,  nontender, nondistended, + bowel sounds  Extremities: warm  dry without cyanosis clubbing or edema  Neuro: AAOx3, cannot abduct and adduct right eye, cannot abduct left eye  Psych: Normal affect and demeanor with intact judgement and insight  Discharge Instructions Discharge Instructions    Discharge instructions    Complete by:  As directed    Patient will be discharged to home with outpatient neurology occupational therapy.  Patient will need to follow up with primary care provider within one week of discharge. May want to follow up with an ophthalmologist. Follow with Dr. Roda Shutters, neurology, in 4-6 weeks. Continue to follow-up with Dr. Tresa Endo, cardiology, at scheduled appointment. Patient should continue medications as prescribed.  Patient should follow a heart healthy diet.     Current Discharge Medication List    START taking these medications   Details  aspirin 325 MG tablet Take 1 tablet (325 mg total) by mouth daily. Qty: 30 tablet, Refills: 0    clopidogrel (PLAVIX) 75 MG tablet Take 1 tablet (75 mg total) by mouth daily. Qty: 30 tablet, Refills: 0      CONTINUE these medications which have CHANGED   Details  atorvastatin (LIPITOR) 80 MG tablet Take 1 tablet (80 mg total) by mouth daily at 6 PM. Qty: 30 tablet, Refills: 0   Associated Diagnoses: Hyperlipidemia LDL goal <70      CONTINUE these medications which have NOT CHANGED   Details  carvedilol (COREG) 12.5 MG tablet Take 1 tablet (12.5 mg total) by mouth 2 (two) times daily. Qty: 60 tablet, Refills: 11    furosemide (LASIX) 40 MG tablet Take 1 tablet (40 mg total) by mouth daily. Qty: 30 tablet, Refills: 11    lisinopril (PRINIVIL,ZESTRIL) 10 MG tablet Take 1.5 tablets (15 mg total) by mouth daily. Qty: 45 tablet, Refills: 11    potassium chloride SA (K-DUR,KLOR-CON) 20 MEQ tablet Take 1 tablet (20 mEq total) by mouth daily. Qty: 30 tablet, Refills: 11    spironolactone (ALDACTONE) 25 MG tablet Take 1/2 tablet  daily for 1 week then increase to 1/2 tablet twice a day. Qty: 30 tablet, Refills: 6      STOP taking these medications     aspirin EC 81 MG EC tablet        No Known Allergies Follow-up Information    Primary care physician. Schedule an appointment as soon as possible for a visit in 1 week(s).   Why:  Hospital follow up       [facility].   Specialty:  Rehabilitation Why:  referral sent for outpt OT Contact information: [address] [id number] mc [place] [address] [phone]       Marvel Plan, MD. Schedule an appointment as soon as possible for a visit in 4 week(s).   Specialty:  Neurology Why:  Hospital follow up Contact information: [address] [place] [address] [phone]            The results of significant diagnostics from this hospitalization (including imaging, microbiology, ancillary and laboratory) are listed below for reference.    Significant Diagnostic Studies: Ct Angio Head W Or Wo Contrast  Result Date: 07/18/2017 CLINICAL DATA:  51 year old male with Diplopia. EXAM: CT ANGIOGRAPHY HEAD AND NECK TECHNIQUE: Multidetector CT imaging of the head and neck was performed using the standard protocol during bolus administration of intravenous contrast. Multiplanar CT image reconstructions and MIPs were obtained to evaluate the vascular anatomy. Carotid stenosis measurements (when applicable) are obtained utilizing NASCET criteria, using the  distal internal carotid diameter as the denominator. CONTRAST:  50 mL Isovue 370 COMPARISON:  Brain MRI and noncontrast head CT 07/17/2017 FINDINGS: CTA NECK Skeleton: Poor dentition.  No acute osseous abnormality identified. Upper chest: No superior mediastinal lymphadenopathy. Negative lung apices. Other neck: Negative; no cervical lymphadenopathy. Left scalp lipoma re- demonstrated. Aortic arch: Bovine type arch  configuration. No significant arch atherosclerosis or great vessel origin stenosis. Right carotid system: Tortuous proximal right CCA. Mild soft plaque at the right carotid bifurcation primarily affects the ECA. Cervical right ICA tortuosity but no stenosis. Left carotid system: Bovine type left CCA origin with no plaque or stenosis. Minimal calcified plaque at the left carotid bifurcation affecting the ECA. Cervical left ICA is tortuous but without stenosis. Vertebral arteries:No proximal subclavian artery plaque or stenosis. Normal vertebral artery origins. Codominant vertebral arteries are normal to the skullbase. CTA HEAD Posterior circulation: Normal distal vertebral arteries. Normal PICA origins and vertebrobasilar junction. Normal basilar artery. Normal SCA and PCA origins. Posterior communicating arteries are diminutive or absent. Bilateral PCA branches appear normal. Anterior circulation: Patent ICA siphons with minimal calcified plaque and no stenosis. Mild right siphon dolichoectasia. Normal ophthalmic artery origins. Patent carotid termini. Normal MCA and ACA origins. Dominant right ACA A1 segment. Anterior communicating artery and bilateral ACA branches are within normal limits. Left MCA M1 segment, bifurcation, and left MCA branches are within normal limits. Right MCA M1 segment, bifurcation, and right MCA branches are within normal limits. Venous sinuses: Patent. Anatomic variants: Dominant right ACA A1 segment. Bovine type arch configuration. Delayed phase: Stable gray-white matter differentiation throughout the brain. Right parietal lobe cortical encephalomalacia. No abnormal enhancement identified. Review of the MIP images confirms the above findings IMPRESSION: 1. Negative CTA Head and Neck; - Minimal calcified atherosclerosis affecting only the carotids. - Mild vessel tortuosity/ectasia. 2.  Stable CT appearance of the brain. 3. Poor dentition. Electronically Signed   By: Odessa Fleming M.D.   On:  07/18/2017 10:11   Ct Head Wo Contrast  Result Date: 07/17/2017 CLINICAL DATA:  Double vision for 1 week. EXAM: CT HEAD WITHOUT CONTRAST TECHNIQUE: Contiguous axial images were obtained from the base of the skull through the vertex without intravenous contrast. COMPARISON:  None. FINDINGS: Brain: No evidence for acute hemorrhage, mass lesion, midline shift, hydrocephalus or large infarct. Focal low-density in the right parietal lobe is suggestive for an old insult. There is subtle low-density in the left frontal white matter. Subtle areas of low-density in the white matter suggesting chronic changes. Vascular: No hyperdense vessel or unexpected calcification. Skull: Normal. Negative for fracture or focal lesion. Sinuses/Orbits: Polyp or retention cyst in the left maxillary sinus. Otherwise, the visualized paranasal sinuses are aerated. Other: Fat attenuating soft tissue structure along the left frontal scalp. 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 IMPRESSION: No acute intracranial abnormality. Old insult in the right parietal lobe. Nonspecific white matter changes may represent chronic small vessel ischemic disease. Electronically Signed   By: Richarda Overlie M.D.   On: 07/17/2017 14:13   Ct Angio Neck W Or Wo Contrast  Result Date: 07/18/2017 CLINICAL DATA:  51 year old male with Diplopia. EXAM: CT ANGIOGRAPHY HEAD AND NECK TECHNIQUE: Multidetector CT imaging of the head and neck was performed using the standard protocol during bolus administration of intravenous contrast. Multiplanar CT image reconstructions and MIPs were obtained to evaluate the vascular anatomy. Carotid stenosis measurements (when applicable) are obtained utilizing NASCET criteria, using the distal internal carotid diameter as the denominator.  CONTRAST:  50 mL Isovue 370 COMPARISON:  Brain MRI and noncontrast head CT 07/17/2017 FINDINGS: CTA NECK Skeleton: Poor dentition.  No acute osseous abnormality identified. Upper chest:  No superior mediastinal lymphadenopathy. Negative lung apices. Other neck: Negative; no cervical lymphadenopathy. Left scalp lipoma re- demonstrated. Aortic arch: Bovine type arch configuration. No significant arch atherosclerosis or great vessel origin stenosis. Right carotid system: Tortuous proximal right CCA. Mild soft plaque at the right carotid bifurcation primarily affects the ECA. Cervical right ICA tortuosity but no stenosis. Left carotid system: Bovine type left CCA origin with no plaque or stenosis. Minimal calcified plaque at the left carotid bifurcation affecting the ECA. Cervical left ICA is tortuous but without stenosis. Vertebral arteries:No proximal subclavian artery plaque or stenosis. Normal vertebral artery origins. Codominant vertebral arteries are normal to the skullbase. CTA HEAD Posterior circulation: Normal distal vertebral arteries. Normal PICA origins and vertebrobasilar junction. Normal basilar artery. Normal SCA and PCA origins. Posterior communicating arteries are diminutive or absent. Bilateral PCA branches appear normal. Anterior circulation: Patent ICA siphons with minimal calcified plaque and no stenosis. Mild right siphon dolichoectasia. Normal ophthalmic artery origins. Patent carotid termini. Normal MCA and ACA origins. Dominant right ACA A1 segment. Anterior communicating artery and bilateral ACA branches are within normal limits. Left MCA M1 segment, bifurcation, and left MCA branches are within normal limits. Right MCA M1 segment, bifurcation, and right MCA branches are within normal limits. Venous sinuses: Patent. Anatomic variants: Dominant right ACA A1 segment. Bovine type arch configuration. Delayed phase: Stable gray-white matter differentiation throughout the brain. Right parietal lobe cortical encephalomalacia. No abnormal enhancement identified. Review of the MIP images confirms the above findings IMPRESSION: 1. Negative CTA Head and Neck; - Minimal calcified  atherosclerosis affecting only the carotids. - Mild vessel tortuosity/ectasia. 2.  Stable CT appearance of the brain. 3. Poor dentition. Electronically Signed   By: Odessa Fleming M.D.   On: 07/18/2017 10:11   [name] Wo Contrast  Result Date: 07/17/2017 CLINICAL DATA:  Diplopia EXAM: MRI HEAD WITHOUT AND WITH CONTRAST TECHNIQUE: Multiplanar, multiecho pulse sequences of the brain and surrounding structures were obtained without and with intravenous contrast. CONTRAST:  31mL MULTIHANCE GADOBENATE DIMEGLUMINE 529 MG/ML IV SOLN COMPARISON:  Head CT 07/17/2017 FINDINGS: Brain: The midline structures are normal. There is no focal diffusion restriction to indicate acute infarct. There is beginning confluent hyperintense T2-weighted signal within the brainstem and the periventricular and deep white matter, most often seen in the setting of chronic microvascular ischemia. No intraparenchymal hematoma or chronic microhemorrhage. Brain volume is normal for age without lobar predominant atrophy. The dura is normal and there is no extra-axial collection. Vascular: Major intracranial arterial and venous sinus flow voids are preserved. Skull and upper cervical spine: The visualized skull base, calvarium, upper cervical spine are normal. Lipoma of the left frontal scalp. Sinuses/Orbits: No fluid levels or advanced mucosal thickening. No mastoid or middle ear effusion. Normal orbits. IMPRESSION: 1. No acute abnormality. 2. Chronic ischemic microangiopathy. Electronically Signed   By: Deatra Robinson M.D.   On: 07/17/2017 22:58    Microbiology: No results found for this or any previous visit (from the past 240 hour(s)).   Labs: Basic Metabolic Panel:  Recent Labs Lab 07/17/17 1320 07/17/17 1334  NA 135 140  K 4.2 4.1  CL 103 103  CO2 25  --   GLUCOSE 99 100*  BUN 17 22*  CREATININE 0.95 0.90  CALCIUM 8.9  --    Liver Function  Tests:  Recent Labs Lab 07/17/17 1320  AST 33  ALT 26  ALKPHOS 53  BILITOT  1.5*  PROT 7.3  ALBUMIN 3.7   No results for input(s): LIPASE, AMYLASE in the last 168 hours. No results for input(s): AMMONIA in the last 168 hours. CBC:  Recent Labs Lab 07/17/17 1320 07/17/17 1334  WBC 9.1  --   NEUTROABS 5.5  --   HGB 14.4 15.0  HCT 42.8 44.0  MCV 98.2  --   PLT 241  --    Cardiac Enzymes: No results for input(s): CKTOTAL, CKMB, CKMBINDEX, TROPONINI in the last 168 hours. BNP: BNP (last 3 results)  Recent Labs  07/28/16 2052 02/10/17 1525  BNP 414.3* 752.4*    ProBNP (last 3 results) No results for input(s): PROBNP in the last 8760 hours.  CBG: No results for input(s): GLUCAP in the last 168 hours.     SignedEdsel Petrin  [facility] 07/19/2017, 1:46 PM

## 2017-07-18 NOTE — Evaluation (Addendum)
Occupational Therapy Evaluation Patient Details Name: Nicholas Kelly. MRN: [id number] DOB: [birth year] Today's Date: 07/18/2017    History of Present Illness 51 y.o. male with medical history significant for hypertension, obesity, and nonischemic cardiomyopathy with EF 20-25%, now presenting to the emergency department for evaluation of visual disturbance for the past week. Patient reports diplopia for at least one week, worse with gazing laterally to either side. His family is noted that his gait has been on stable over the same interval. Patient saw his ophthalmologist for evaluation of these findings and was recommended to proceed to the emergency department for evaluation of possible stroke.Patient saw his ophthalmologist for evaluation of these findings and was recommended to proceed to the emergency department for evaluation of possible stroke. CT and MRI negative.   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 Pt currently performing ADLs at [facility] level demonstrating decreased functional performance due to blurry and double vision. Pt would benefit from acute OT to increase safety and independence with ADLs and IADLs. Recommend dc home once medically stable per physician with follow up with Neuro Outpatient OT to optimize safety, independence, and performance of ADLs and IADLs.     Follow Up Recommendations  Outpatient OT (Outpatient Neuro OT)    Equipment Recommendations  None recommended by OT    Recommendations for Other Services       Precautions / Restrictions Precautions Precautions: None Restrictions Weight Bearing Restrictions: No      Mobility Bed Mobility Overal bed mobility: Modified Independent             General bed mobility comments: Increased time  Transfers Overall transfer level: Needs assistance Equipment used: None Transfers: Sit to/from Stand Sit to Stand: Supervision          General transfer comment: supervision for safety    Balance                                           ADL either performed or assessed with clinical judgement   ADL Overall ADL's : Needs assistance/impaired Eating/Feeding: Set up;Sitting   Grooming: Standing;Wash/dry hands;Supervision/safety Grooming Details (indicate cue type and reason): close supervision for hand hygiene for safety Upper Body Bathing: Set up;Supervision/ safety;Standing   Lower Body Bathing: Set up;Supervison/ safety;Sit to/from stand   Upper Body Dressing : Set up;Supervision/safety;Sitting   Lower Body Dressing: Set up;Supervision/safety;Sit to/from stand   Toilet Transfer: Supervision/safety;Ambulation;Regular Teacher, adult education Details (indicate cue type and reason): Pt performed toielt transfer with supervision for safety Toileting- Clothing Manipulation and Hygiene: Supervision/safety;Sit to/from stand   Tub/ Shower Transfer: Tub transfer;Ambulation;Min Psychologist, prison and probation services Details (indicate cue type and reason): Min Guard for safety Functional mobility during ADLs: Supervision/safety General ADL Comments: Pt performed ADLs and fucntional mobility at [employer] level. However, pt with blurry vision throughout session which impacted his fucntional performance and stability.      Vision Baseline Vision/History: Wears glasses Wears Glasses: At all times Patient Visual Report: Blurring of vision;Diplopia Vision Assessment?: Yes;Vision impaired- to be further tested in functional context Ocular Range of Motion: Restricted on the left;Restricted on the right;Impaired-to be further tested in functional context Convergence: Within functional limits Additional Comments: During tracking test, pt with decreased occular motor movement to left and right     Perception     Praxis  Pertinent Vitals/Pain Pain Assessment: Faces Faces Pain Scale: No hurt Pain  Intervention(s): Monitored during session     Hand Dominance Left   Extremity/Trunk Assessment Upper Extremity Assessment Upper Extremity Assessment: Overall WFL for tasks assessed   Lower Extremity Assessment Lower Extremity Assessment: Overall WFL for tasks assessed   Cervical / Trunk Assessment Cervical / Trunk Assessment: Other exceptions Cervical / Trunk Exceptions: Increased body habitus   Communication Communication Communication: No difficulties   Cognition Arousal/Alertness: Awake/alert Behavior During Therapy: WFL for tasks assessed/performed Overall Cognitive Status: Impaired/Different from baseline Area of Impairment: Memory;Safety/judgement                     Memory: Decreased short-term memory   Safety/Judgement: Decreased awareness of safety     General Comments: Pt unable to recall 3 memory words and demonstrated decreased safety awareness. Pt becoming quickly frustrated at having to have someone present for mobility till he is cleared in room. Pt completing simple money management task without difficulty.   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        OT Problem List: Impaired balance (sitting and/or standing);Impaired vision/perception;Decreased safety awareness      OT Treatment/Interventions: Self-care/ADL training;Therapeutic exercise;Energy conservation;DME and/or AE  instruction;Patient/family education;Therapeutic activities;Visual/perceptual remediation/compensation    OT Goals(Current goals can be found in the care plan section) Acute Rehab OT Goals Patient Stated Goal: "Find out whats wrong" OT Goal Formulation: With patient Time For Goal Achievement: 08/01/17 Potential to Achieve Goals: Good ADL Goals Pt Will Perform Grooming: Independently;standing Additional ADL Goal #1: Pt will independently perform three step cooking task  Additional ADL Goal #2: Pt will independently perform trail making task in Moderately distracting environement  OT Frequency: Min 2X/week   Barriers to D/C:            Co-evaluation PT/OT/SLP Co-Evaluation/Treatment: Yes Reason for Co-Treatment: Complexity of the patient's impairments (multi-system involvement) PT goals addressed during session: Mobility/safety with mobility OT goals addressed during session: ADL's and self-care      AM-PAC PT "6 Clicks" Daily Activity     Outcome Measure Help from another person eating meals?: None Help from another person taking care of personal grooming?: None Help from another person toileting, which includes using toliet, bedpan, or urinal?: A Little Help from another person bathing (including washing, rinsing, drying)?: A Little Help from another person to put on and taking off regular upper body clothing?: None Help from another person to put on and taking off regular lower body clothing?: A Little 6 Click Score: 21   End of Session Equipment Utilized During Treatment: Gait belt Nurse Communication: Mobility status  Activity Tolerance: Patient tolerated treatment well Patient left: in chair (with transport team)  OT Visit Diagnosis: Unsteadiness on feet (R26.81);Dizziness and giddiness (R42);Low vision, both eyes (H54.2)  Time: 1610-9604 OT Time Calculation (min): 11 min Charges:  OT General Charges $OT Visit: 1 Visit OT Evaluation $OT Eval Moderate  Complexity: 1 Mod G-Codes: OT G-codes **NOT FOR INPATIENT CLASS** Functional Assessment Tool Used: Clinical judgement Functional Limitation: Self care Self Care Current Status (V4098): At least 1 percent but less than 20 percent impaired, limited or restricted Self Care Goal Status (J1914): 0 percent impaired, limited or restricted   Dollye Glasser MSOT, OTR/L Acute Rehab Pager: [phone] Office: [phone]  Theodoro Grist Darwin Guastella 07/18/2017, 9:44 AM

## 2017-07-18 NOTE — Evaluation (Signed)
Physical Therapy Evaluation Patient Details Name: Nicholas Kelly. MRN: [id number] DOB: [birth year] Today's Date: 07/18/2017   History of Present Illness  51 y.o. male with medical history significant for hypertension, obesity, and nonischemic cardiomyopathy with EF 20-25%, now presenting to the emergency department for evaluation of visual disturbance for the past week. Patient reports diplopia for at least one week, worse with gazing laterally to either side. His family is noted that his gait has been on stable over the same interval. Patient saw his ophthalmologist for evaluation of these findings and was recommended to proceed to the emergency department for evaluation of possible stroke.Patient saw his ophthalmologist for evaluation of these findings and was recommended to proceed to the emergency department for evaluation of possible stroke. CT and MRI negative.  Clinical Impression  Orders received for PT evaluation. Patient demonstrates very modest deficits in functional mobility but is overall mobilizing well despite visual deficits. At this time, will defer all further needs to OT recommendations for visual impairments. Acute PT to sign off.    Follow Up Recommendations No PT follow up (defer needs for vision to OT)    Equipment Recommendations  None recommended by PT    Recommendations for Other Services       Precautions / Restrictions Precautions Precautions: None Restrictions Weight Bearing Restrictions: No      Mobility  Bed Mobility Overal bed mobility: Modified Independent             General bed mobility comments: Increased time  Transfers Overall transfer level: Needs assistance Equipment used: None Transfers: Sit to/from Stand Sit to Stand: Supervision         General transfer comment: supervision for safety  Ambulation/Gait Ambulation/Gait assistance: Supervision Ambulation Distance (Feet): 310 Feet Assistive device: None Gait  Pattern/deviations: WFL(Within Functional Limits) Gait velocity: modestly decreased   General Gait Details: very modest instability with ambulation, reports this is baseline due to knee issues.   Stairs Stairs: Yes Stairs assistance: Supervision Stair Management: One rail Left Number of Stairs: 4 General stair comments: attempted without rail, modest instability therefore utilized rail for remained of steps  Wheelchair Mobility    Modified Rankin (Stroke Patients Only) Modified Rankin (Stroke Patients Only) Pre-Morbid Rankin Score: No symptoms Modified Rankin: Moderate disability     Balance Overall balance assessment: Needs assistance                           High level balance activites: Direction changes;Turns;Sudden stops;Head turns High Level Balance Comments: no physical assist or significant LOB              Pertinent Vitals/Pain Pain Assessment: Faces Faces Pain Scale: No hurt Pain Intervention(s): Monitored during session    Home Living Family/patient expects to be discharged to:: Private residence Living Arrangements: Spouse/significant other Available Help at Discharge: Family Type of Home: House Home Access: Stairs to enter   Secretary/administrator of Steps: 3 Home Layout: One level Home Equipment: None      Prior Function Level of Independence: Independent         Comments: ADLs, IADls, and 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     Hand Dominance   Dominant Hand: Left    Extremity/Trunk Assessment   Upper Extremity Assessment Upper Extremity Assessment: Overall WFL for tasks assessed    Lower Extremity Assessment Lower Extremity Assessment: Overall WFL for tasks assessed    Cervical / Trunk Assessment  Cervical / Trunk Assessment: Other exceptions Cervical / Trunk Exceptions: Increased body habitus  Communication   Communication: No difficulties  Cognition Arousal/Alertness: Awake/alert Behavior During Therapy:  WFL for tasks assessed/performed Overall Cognitive Status: Impaired/Different from baseline Area of Impairment: Memory;Safety/judgement                     Memory: Decreased short-term memory   Safety/Judgement: Decreased awareness of safety     General Comments: Pt unable to recall 3 memory words and demonstrated decreased safety awareness. Pt becoming quickly frustrated at having to have someone present for mobility till he is cleared in room. Pt completing simple money management task without difficulty.      General Comments      Exercises     Assessment/Plan    PT Assessment Patent does not need any further PT services (defer needs for vision to outpatient OT)  PT Problem List         PT Treatment Interventions      PT Goals (Current goals can be found in the Care Plan section)  Acute Rehab PT Goals Patient Stated Goal: "Find out whats wrong" PT Goal Formulation: With patient    Frequency     Barriers to discharge        Co-evaluation PT/OT/SLP Co-Evaluation/Treatment: Yes Reason for Co-Treatment: Complexity of the patient's impairments (multi-system involvement) (dove tailed per patient's request) PT goals addressed during session: Mobility/safety with mobility OT goals addressed during session: ADL's and self-care       AM-PAC PT "6 Clicks" Daily Activity  Outcome Measure Difficulty turning over in bed (including adjusting bedclothes, sheets and blankets)?: None Difficulty moving from lying on back to sitting on the side of the bed? : None Difficulty sitting down on and standing up from a chair with arms (e.g., wheelchair, bedside commode, etc,.)?: None Help needed moving to and from a bed to chair (including a wheelchair)?: None Help needed walking in hospital room?: None Help needed climbing 3-5 steps with a railing? : A Little 6 Click Score: 23    End of Session Equipment Utilized During Treatment: Gait belt Activity Tolerance: Patient  tolerated treatment well Patient left:  (with transport)   PT Visit Diagnosis: Other symptoms and signs involving the nervous system (R29.898)    Time: 5697-9480 PT Time Calculation (min) (ACUTE ONLY): 21 min   Charges:   PT Evaluation $PT Eval Moderate Complexity: 1 Mod     PT G Codes:   PT G-Codes **NOT FOR INPATIENT CLASS** Functional Assessment Tool Used: Clinical judgement Functional Limitation: Mobility: Walking and moving around Mobility: Walking and Moving Around Current Status (X6553): At least 1 percent but less than 20 percent impaired, limited or restricted Mobility: Walking and Moving Around Goal Status [phone]): At least 1 percent but less than 20 percent impaired, limited or restricted Mobility: Walking and Moving Around Discharge Status [phone]): At least 1 percent but less than 20 percent impaired, limited or restricted    Charlotte Crumb, PT DPT  Board Certified Neurologic Specialist [phone]   Fabio Asa 07/18/2017, 10:00 AM

## 2017-07-19 DIAGNOSIS — I6340 Cerebral infarction due to embolism of unspecified cerebral artery: Secondary | ICD-10-CM | POA: Insufficient documentation

## 2017-07-19 LAB — ECHOCARDIOGRAM COMPLETE
Height: 68 in
Weight: 4828.8 oz

## 2017-07-19 MED ORDER — ATORVASTATIN CALCIUM 80 MG PO TABS: 80.0000 mg | ORAL_TABLET | Freq: Every day | ORAL | 0 refills | Status: DC

## 2017-07-19 MED ORDER — CLOPIDOGREL BISULFATE 75 MG PO TABS: 75.0000 mg | ORAL_TABLET | Freq: Every day | ORAL | 0 refills | Status: DC

## 2017-07-19 MED ORDER — ASPIRIN 325 MG PO TABS: 325.0000 mg | ORAL_TABLET | Freq: Every day | ORAL | 0 refills | Status: DC

## 2017-07-19 NOTE — Progress Notes (Signed)
CM consulted on assistance with cost of lovenox for home. CM would MATCH the cost for the patient so he can bridge from Lovenox to coumadin.  Pt without a PCP or insurance. CM met with the patient and he was interested in being set up with one of the [facility]. CM was able to obtain him an appointment at the [facility]. Information on his AVS.  Plan has changed and he will d/c home on ASA and plavix. CM asked that his medications be sent to [facility] pharmacy so they can assist with the cost. CM went over this with the patient and he was in agreement.  CM consulted for Outpatient therapy. Pt interested in [facility]. Orders in EPIC and information on the AVS.  Pt states he has transportation home today.  

## 2017-07-19 NOTE — Progress Notes (Signed)
STROKE TEAM PROGRESS NOTE   SUBJECTIVE (INTERVAL HISTORY) No family is at the bedside. He has partial occluder in his glasses. No significant change of his visual disturbance. TTE showed EF 30%, improved from his EF 20-25% in 01/2017. He follows with cardiology next month.    OBJECTIVE Temp:  [98 F (36.7 C)-99.2 F (37.3 C)] 98.4 F (36.9 C) (09/24 1339) Pulse Rate:  [70-88] 74 (09/24 1339) Cardiac Rhythm: Normal sinus rhythm (09/24 0800) Resp:  [16-18] 18 (09/24 1339) BP: (102-153)/(57-99) 102/57 (09/24 1339) SpO2:  [96 %-98 %] 97 % (09/24 1339)  CBC:   Recent Labs Lab 07/17/17 1320 07/17/17 1334  WBC 9.1  --   NEUTROABS 5.5  --   HGB 14.4 15.0  HCT 42.8 44.0  MCV 98.2  --   PLT 241  --     Basic Metabolic Panel:   Recent Labs Lab 07/17/17 1320 07/17/17 1334  NA 135 140  K 4.2 4.1  CL 103 103  CO2 25  --   GLUCOSE 99 100*  BUN 17 22*  CREATININE 0.95 0.90  CALCIUM 8.9  --     Lipid Panel:     Component Value Date/Time   CHOL 163 07/18/2017 0558   TRIG 56 07/18/2017 0558   HDL 33 (L) 07/18/2017 0558   CHOLHDL 4.9 07/18/2017 0558   VLDL 11 07/18/2017 0558   LDLCALC 119 (H) 07/18/2017 0558   HgbA1c:  Lab Results  Component Value Date   HGBA1C 5.0 07/18/2017   Urine Drug Screen: No results found for: LABOPIA, COCAINSCRNUR, LABBENZ, AMPHETMU, THCU, LABBARB  Alcohol Level No results found for: [name]   IMAGING I have personally reviewed the radiological images below and agree with the radiology interpretations.  Ct Head Wo Contrast 07/17/2017 IMPRESSION:  No acute intracranial abnormality. Old insult in the right parietal lobe. Nonspecific white matter changes may represent chronic small vessel ischemic disease.   [name] Wo Contrast 07/17/2017 IMPRESSION:  1. No acute abnormality. However, I read with Dr. Margo Aye, Radiologist, there were encephalomalacia at bilateral pontine, right parietal region corresponding to CT findings. 2. Chronic  ischemic microangiopathy.   Ct Angio Head W Or Wo Contrast Ct Angio Neck W Or Wo Contrast 07/18/2017  IMPRESSION:  1. Negative CTA Head and Neck. Minimal calcified atherosclerosis affecting only the carotids. - Mild vessel tortuosity/ectasia.  2. Stable CT appearance of the brain.  3. Poor dentition  TTE - Mildly dilated LV with mild LV hypertrophy. EF 30% with diffuse   hypokinesis. Normal RV size and systolic function. No significant   valvular abnormalities.   PHYSICAL EXAM Vitals:   07/19/17 0520 07/19/17 0903 07/19/17 1020 07/19/17 1339  BP: 139/88 (!) 138/99 127/80 (!) 102/57  Pulse: 73 88 70 74  Resp: Temp: 98.5 F (36.9 C)  98.6 F (37 C) 98.4 F (36.9 C)  TempSrc: Oral  Oral Oral  SpO2: 98%  96% 97%  Weight:      Height:        Temp:  [98 F (36.7 C)-99.2 F (37.3 C)] 98.4 F (36.9 C) (09/24 1339) Pulse Rate:  [70-88] 74 (09/24 1339) Resp:  [16-18] 18 (09/24 1339) BP: (102-153)/(57-99) 102/57 (09/24 1339) SpO2:  [96 %-98 %] 97 % (09/24 1339)  General - morbid obesity, well developed, in no apparent distress.  Ophthalmologic - Fundi not visualized due to small pupils.  Cardiovascular - Regular rate and rhythm.  Mental Status -  Level of  arousal and orientation to time, place, and person were intact. Language including expression, naming, repetition, comprehension was assessed and found intact. Fund of Knowledge was assessed and was intact.  Cranial Nerves II - XII - II - Visual field intact OU. III, IV, VI - disconjugate eyes, with inward deviation bilaterally. Able to move upward and downward, however, not able to have horizontal gaze bilaterally. PERRL. No nystagmus.  V - Facial sensation intact bilaterally. VII - Facial movement intact bilaterally. VIII - Hearing & vestibular intact bilaterally. X - Palate elevates symmetrically. XI - Chin turning & shoulder shrug intact bilaterally. XII - Tongue protrusion intact.  Motor Strength -  The patient's strength was normal in all extremities and pronator drift was absent.  Bulk was normal and fasciculations were absent.   Motor Tone - Muscle tone was assessed at the neck and appendages and was normal.  Reflexes - The patient's reflexes were 1+ in all extremities and he had no pathological reflexes.  Sensory - Light touch, temperature/pinprick were assessed and were symmetrical.    Coordination - The patient had normal movements in the hands with no ataxia or dysmetria.  Tremor was absent.  Gait and Station - not tested   ASSESSMENT/PLAN [name]. is a 51 y.o. male with history of hypertension, hyperlipidemia and CHF presenting with diplopia and disequilibrium. He did not receive IV t-PA due to late presentation.  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 Old insult in the right parietal lobe.  MRI head - No acute abnormality. Encephalomalacia at b/l pontine and right parietal lobe  CTA H&N - negative  2D Echo - EF 30%, improved from 20-25% in 01/2017  LDL - 119  HgbA1c - 5.0  VTE prophylaxis  - Lovenox Diet Heart Room service appropriate? Yes; Fluid consistency: Thin  aspirin 81 mg daily prior to admission, now on aspirin 325 mg daily. Due to the improved for cardiac function and EF 30%, will hold off anticoagulation at this time. Recommend ASA 325mg  and plavix 75mg  for stroke prevention. Repeat TTE in 2 months.  Patient counseled to be compliant with his antithrombotic medications  Ongoing aggressive stroke risk factor management  Therapy recommendations:  Outpatient OT   Disposition: Pending  Non-ischemic  cardiomyopathy  Following with cardiology   Last TTE 01/2017 EF 20-25%  On coreg, lasix, lisinopril, spironolactone at home  TTE EF 30%   Due to the improved EF, will hold off anticoagulation at this time. Recommend ASA 325mg  and plavix 75mg  for stroke prevention.  Pt has scheduled follow up with cardiology 08/18/17  Will need follow up with stroke clinic in 2 months to repeat TTE for evaluation of EF.  Hypertension  Stable  Long-term BP goal normotensive  Hyperlipidemia  Home meds:  Lipitor 40 mg daily resumed in hospital  LDL 119, goal < 70  Increase Lipitor to 80 mg daily  Continue statin at discharge  Other Stroke Risk Factors  Former cigarette smoker - quit approximately one year ago  ETOH use, advised to drink no more than 1 - 2 drink(s) a day  Morbid obesity, Body mass index is 45.89 kg/m., recommend weight loss, diet and exercise as appropriate  Hx stroke/TIA - CT and MRI showed old infarct in the right parietal lobe  Other Active Problems    Hospital day # 0  Neurology will sign off. Please call with questions. Pt will follow up with [facility] Roda Shutters at [facility] in about 6 weeks. Thanks for the consult.   Marvel Plan, MD PhD Stroke Neurology 07/19/2017 4:34 PM    To contact Stroke Continuity provider, please refer to [email or web address]. After hours, contact General Neurology

## 2017-07-19 NOTE — Progress Notes (Signed)
Discharge orders and follow up appointments reviewed with pt. Teaching and handout provided. Pt v/s stable. Pt escorted from unit via wheelchair.

## 2017-07-19 NOTE — Progress Notes (Signed)
Pt Bp was 138/99 at 0903, medications given. Bp was 127/80 when rechecked at 1020

## 2017-07-19 NOTE — Progress Notes (Signed)
Occupational Therapy Treatment Patient Details Name: Nicholas Kelly. MRN: [id number] DOB: January 19, 1966 Today's Date: 07/19/2017    History of present illness 51 y.o. male with medical history significant for hypertension, obesity, and nonischemic cardiomyopathy with EF 20-25%, now presenting to the emergency department for evaluation of visual disturbance for the past week. Patient reports diplopia for at least one week, worse with gazing laterally to either side. His family is noted that his gait has been on stable over the same interval. Patient saw his ophthalmologist for evaluation of these findings and was recommended to proceed to the emergency department for evaluation of possible stroke.Patient saw his ophthalmologist for evaluation of these findings and was recommended to proceed to the emergency department for evaluation of possible stroke. CT and MRI negative.   OT comments  Pt seen x 2 for education on partial occlusion to reduce symptoms of double vision and increase functional vision. Nasal portion of R lens taped and appeared to help reduce symptoms of diplopia. Pt able to return demonstrate the ability to tape his glasses and reduce the width of the tape as his vision improves. Also educated on home safety and adjusting to altered depth perception. Pt given written information and demonstrated understanding. Recommend follow up with OT at the neuro outpt cl[facility]  Follow Up Recommendations  Outpatient OT    Equipment Recommendations  None recommended by OT    Recommendations for Other Services      Precautions / Restrictions Precautions Precautions: Fall (due to vision deficits)       Mobility Bed Mobility Overal bed mobility: Modified Independent                Transfers Overall transfer level: Needs assistance     Sit to Stand: Supervision              Balance Overall balance assessment: Needs assistance   Sitting balance-Leahy Scale: Good        Standing balance-Leahy Scale: Good                             ADL either performed or assessed with clinical judgement   ADL Overall ADL's : Needs assistance/impaired                                     Functional mobility during ADLs: Supervision/safety General ADL Comments: set up for ADL tasks     Vision    does not appear able to abduct R/L eyes; able to complete vertical movement; able to adduct eyes with minimal difficulty Complains of double vision at all times with the exception or superior/inferior gazes Appears to be L eye dominant Partial occlusion used with good results functionally   Perception     Praxis      Cognition Arousal/Alertness: Awake/alert Behavior During Therapy: WFL for tasks assessed/performed Overall Cognitive Status: Within Functional Limits for tasks assessed                                 General Comments: Appears improved from yesterday's session        Exercises     Shoulder Instructions       General Comments      Pertinent Vitals/ Pain       Pain Assessment: No/denies pain  Home  Living                                          Prior Functioning/Environment              Frequency  Min 2X/week        Progress Toward Goals  OT Goals(current goals can now be found in the care plan section)  Progress towards OT goals: Goals met/education completed, patient discharged from OT (continue with outpt OT)  Acute Rehab OT Goals Patient Stated Goal: go home OT Goal Formulation: With patient Time For Goal Achievement: 08/01/17 Potential to Achieve Goals: Good ADL Goals Pt Will Perform Grooming: Independently;standing Additional ADL Goal #1: Pt will independently perform three step cooking task  Additional ADL Goal #2: Pt will verbalize understanind of use of taping to reduce symptoms of diplopia and increase functional vision  Plan Discharge plan remains  appropriate    Co-evaluation                 AM-PAC PT "6 Clicks" Daily Activity     Outcome Measure   Help from another person eating meals?: None Help from another person taking care of personal grooming?: None Help from another person toileting, which includes using toliet, bedpan, or urinal?: None Help from another person bathing (including washing, rinsing, drying)?: None Help from another person to put on and taking off regular upper body clothing?: None Help from another person to put on and taking off regular lower body clothing?: None 6 Click Score: 24    End of Session    OT Visit Diagnosis: Unsteadiness on feet (R26.81);Dizziness and giddiness (R42);Low vision, both eyes (H54.2)   Activity Tolerance Patient tolerated treatment well   Patient Left in bed;with call bell/phone within reach;with family/visitor present   Nurse Communication Other (comment) (need for pt education prior to DC)        Time: 8592-7639 OT Time Calculation (min): 14 min  Second visit: 1200-1230 Time 30 min  Charges: OT General Charges $OT Visit: 1 Visit OT Treatments $Therapeutic Activity: 38-52 mins  [facility], OT/L  [phone] 07/19/2017   Tylan Briguglio,HILLARY 07/19/2017, 5:47 PM

## 2017-07-19 NOTE — Progress Notes (Signed)
   07/19/17 1600  Clinical Encounter Type  Visited With Patient;Family  Visit Type Initial;Spiritual support (AD request)  Referral From Nurse  Consult/Referral To Chaplain  Spiritual Encounters  Spiritual Needs Prayer  Stress Factors  Patient Stress Factors None identified  Family Stress Factors None identified  Advance Directives (For Healthcare)  Does Patient Have a Medical Advance Directive? No  Would patient like information on creating a medical advance directive? Yes (Inpatient - patient requests chaplain consult to create a medical advance directive) (Patient being discharged. Planes to complete AD at bank)  Patient being discharged. Wants AD info to complete and plans to go to bank to have it notarized. Phebe Colla, Chaplain

## 2017-08-02 ENCOUNTER — Encounter: Payer: Self-pay | Admitting: Occupational Therapy

## 2017-08-02 ENCOUNTER — Ambulatory Visit: Payer: Self-pay | Attending: Neurology | Admitting: Occupational Therapy

## 2017-08-02 DIAGNOSIS — R41842 Visuospatial deficit: Secondary | ICD-10-CM | POA: Insufficient documentation

## 2017-08-02 NOTE — Therapy (Signed)
[facility] [address] [place], [place], [address] Phone: [phone]   Fax:  [phone]  Occupational Therapy Evaluation  Patient Details  Name: Nicholas Kelly. MRN: [id number] Date of Birth: 1966-08-01 Referring Provider: Guerry Bruin  Encounter Date: 08/02/2017      OT End of Session - 08/02/17 1408    Visit Number 1   Number of Visits 8   Date for OT Re-Evaluation 08/30/17   Authorization Type no insurance - pt states that the SW in the hospital did assist him in applying for Medicaid. Pt understands that this may not be retroactive and that he may wait a significant period of time to hear. Pt wishes to proceed with therapies.  Also advised pt to request application for financial assistance.    OT Start Time 1101   OT Stop Time 1144   OT Time Calculation (min) 43 min   Activity Tolerance Patient tolerated treatment well      Past Medical History:  Diagnosis Date  . Acute CHF (congestive heart failure) (HCC) 02/10/2017  . Dyspnea   . Hypertension     Past Surgical History:  Procedure Laterality Date  . NO PAST SURGERIES    . RIGHT/LEFT HEART CATH AND CORONARY ANGIOGRAPHY N/A 02/12/2017   Procedure: Right/Left Heart Cath and Coronary Angiography;  Surgeon: Lyn Records, MD;  Location: [facility] INVASIVE CV LAB;  Service: Cardiovascular;  Laterality: N/A;    There were no vitals filed for this visit.      Subjective Assessment - 08/02/17 1103    Subjective  I need a better understanding of what is going on.    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    Referring Provider D. Marvel Plan   Onset Date 07/17/17   Prior Therapy PT and OT acute care     Precautions   Precautions None     Restrictions   Weight Bearing Restrictions No     Balance Screen   Has the patient fallen in the past 6 months No     Home  Environment   Family/patient expects to be discharged to: Private residence   Living Arrangements Non-relatives/Friends  friend and her two children   Available Help at Discharge Available PRN/intermittently   Type of Home House   Home Layout One level   Bathroom Shower/Tub Tub/Shower unit   [employer] Standard     Prior Function   Level of Independence Independent   Vocation Full time employment   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   Leisure play golf     ADL   Eating/Feeding Independent   Grooming Modified independent  increased time to  shave   Upper Body Bathing Independent   Lower Body Bathing Independent   Upper Body Dressing Independent   Lower Body Dressing Independent   Toilet Transfer Independent   Toileting - Conservator, museum/gallery -  Tour manager Independent     IADL   Shopping Assistance for transportation   Light Housekeeping Performs light daily tasks such as dishwashing, bed making   Meal Prep Plans, prepares and serves adequate meals independently   Community Mobility Relies on family or friends for transportation   Medication Management Is responsible for taking medication in correct dosages at correct time   Development worker, community financial matters independently (budgets, writes checks, pays rent, bills goes to bank),  collects and keeps track of income     Mobility   Mobility Status Independent     Written Expression   Dominant Hand Left   Handwriting 100% legible     Vision - History   Baseline Vision Wears glasses all the time   Additional Comments Pt with diplopia     Vision Assessment   Eye Alignment Impaired (comment)  R eye severely adducted, R eye minimally adducted.   Ocular Range of Motion Other (comment)  decreased abduction of both eyes/ L 10*/R 20* from midline   Tracking/Visual Pursuits --  R and with limited lateral tracking   Saccades Other (comment)  unable to access accurately due to severe diplopia   Convergence Impaired (comment)   Diplopia Assessment --  Superior/midline inferior field only fields without diplopia   Comment Pt was issued occlussion on glasses in acute care. Discussed that if pt wears these all the time the brain is not given a chance to learn to habituate to the the diplopia and "cancel out" the second image. Pt reports that he has 4 pairs of glasses of which 2 are not occuluded. Discussed wearing occluded glasses for task specific activities (i.e. reading, cooking, computer work). Pt in agreement and denies any headaches, nausea or dizziness at this time when not  wearing occluded glasses.      Activity Tolerance   Activity Tolerance Tolerate 30+ min activity without fatigue     Cognition   Overall Cognitive Status Within Functional Limits for tasks assessed     Sensation   Light Touch Appears Intact   Hot/Cold Appears Intact     Coordination   Gross Motor Movements are Fluid and Coordinated Yes   Fine Motor Movements are Fluid and Coordinated Yes     Tone   Assessment Location Right Upper Extremity;Left Upper Extremity     ROM / Strength   AROM / PROM / Strength AROM;Strength     AROM   Overall AROM  Within functional limits for tasks performed   Overall AROM Comments BUE's     Strength   Overall Strength Within functional limits for tasks  performed   Overall Strength Comments BUE's     Hand Function   Right Hand Gross Grasp Functional   Left Hand Gross Grasp Functional     RUE Tone   RUE Tone Within Functional Limits     LUE Tone   LUE Tone Within Functional Limits                              OT Long Term Goals - 08/02/17 1307      OT LONG TERM GOAL #1   Title Pt will  be mod I with HEP to address oculomotor exercises. - 08/30/2017   Baseline dependent   Status New     OT LONG TERM GOAL #2   Title Pt will demonstrate understanding of using occlusion as adaptation for specific tasks.   Baseline max cueing   Status New     OT LONG TERM GOAL #3   Title Pt will demonstrate ability to read large print without occlusion with AE prn   Baseline unable   Status New     OT LONG TERM GOAL #4   Title Pt will verbalize understanding regarding neuro opthamology referral   Baseline unable   Status New     OT LONG TERM GOAL #5   Title Pt will be mod I with environmental scannng within context of functional tasks without occlusion   Baseline wears occlusion on glasses 100% of the time.   Status New               Plan - 08/02/17 1310    Clinical Impression Statement Pt is a 51 year old male s/p bilateral subacute pontine infarcts on 07/17/2017. Pt hospitalized from 07/17/2017-07/19/2017.  Pt currently living with a friend who can assist prn.  Pt presents today with the following visual impairments that impact IADL, work and leisure tasks: decreased eye alignment, decreased smooth pursuits, decreased ocular motor movement in both eyes, severe diplopia, decreased tracking.  Pt will benefit from skilled OT to address these deficts and maximize independence.     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   Occupational performance deficits (Please refer to evaluation for details):  IADL's;Work;Leisure;Social Participation   Rehab Potential Fair   OT Frequency 2x / week   OT Duration 4 weeks   OT Treatment/Interventions Self-care/ADL training;Therapeutic exercise;Therapeutic activities;Visual/perceptual remediation/compensation;Patient/family education   Plan initiate HEP for eye movement.   Consulted and Agree with Plan of Care Patient      Patient will benefit from skilled therapeutic intervention in order to improve the following deficits and impairments:  Impaired vision/preception  Visit Diagnosis: Visuospatial deficit - Plan: Ot plan of care cert/re-cert    Problem List Patient Active Problem List   Diagnosis Date Noted  . Cerebral embolism with cerebral infarction 07/19/2017  . Hypertension 07/17/2017  . Vision disturbance 07/17/2017  . Non-ischemic cardiomyopathy (HCC) 03/09/2017  . Chronic combined systolic and diastolic CHF (congestive heart failure) (HCC) 02/10/2017  . Hypertensive urgency 02/10/2017  . Obesity 02/10/2017  . Tobacco abuse 02/10/2017  . Acute exacerbation of CHF (congestive heart failure) (HCC) 02/10/2017    Norton Pastel, OTR/L 08/02/2017, 2:15 PM  [facility] [address] [place], [place], [address] Phone: [phone]   Fax:  [phone]  Name: Nicholas Kelly. MRN: [id number] Date of Birth: 05-11-1966

## 2017-08-04 ENCOUNTER — Encounter: Payer: Self-pay | Admitting: Family Medicine

## 2017-08-04 ENCOUNTER — Ambulatory Visit (INDEPENDENT_AMBULATORY_CARE_PROVIDER_SITE_OTHER): Payer: Self-pay | Admitting: Family Medicine

## 2017-08-04 VITALS — BP 146/82 | HR 62 | Temp 98.1°F | Resp 16 | Ht 68.0 in | Wt 306.0 lb

## 2017-08-04 DIAGNOSIS — R41842 Visuospatial deficit: Secondary | ICD-10-CM

## 2017-08-04 DIAGNOSIS — I5042 Chronic combined systolic (congestive) and diastolic (congestive) heart failure: Secondary | ICD-10-CM

## 2017-08-04 DIAGNOSIS — I10 Essential (primary) hypertension: Secondary | ICD-10-CM

## 2017-08-04 DIAGNOSIS — H538 Other visual disturbances: Secondary | ICD-10-CM

## 2017-08-04 LAB — POCT URINALYSIS DIP (DEVICE)
Bilirubin Urine: NEGATIVE
Glucose, UA: NEGATIVE mg/dL
Ketones, ur: NEGATIVE mg/dL
LEUKOCYTES UA: NEGATIVE
Nitrite: NEGATIVE
PH: 5 (ref 5.0–8.0)
Protein, ur: NEGATIVE mg/dL
SPECIFIC GRAVITY, URINE: 1.02 (ref 1.005–1.030)
UROBILINOGEN UA: 0.2 mg/dL (ref 0.0–1.0)

## 2017-08-04 MED ORDER — CLOPIDOGREL BISULFATE 75 MG PO TABS: 75.0000 mg | ORAL_TABLET | Freq: Every day | ORAL | 5 refills | Status: DC

## 2017-08-04 MED ORDER — CLONIDINE HCL 0.1 MG PO TABS: 0.1000 mg | ORAL_TABLET | Freq: Once | ORAL | Status: DC

## 2017-08-04 MED ORDER — ASPIRIN 325 MG PO TABS: 325.0000 mg | ORAL_TABLET | Freq: Every day | ORAL | 3 refills | Status: DC

## 2017-08-04 NOTE — Progress Notes (Signed)
Patient ID: Nicholas Kelly., male    DOB: Apr 02, 1966, 51 y.o.   MRN: [id number]  PCP: Bing Neighbors, FNP  Chief Complaint  Patient presents with  . Establish Care  . Hospitalization Follow-up    Subjective:  HPI Nicholas Kelly. is a 51 y.o. male presents to establish care and for hospital follow-up. Medical problems include chronic ischemic cardiomyopathy, hypertensive urgency, congestive heart failure, cerebral embolism with infarct, and obesity. Patient presented to the ED on 07/17/2017 with complaint of visual disturbances for a week. He was negative for any balance issues, headache, or numbness tingling or weakness. CTA and MRI were negative however and neurology confirmed that patient was most likely is experiencing CVA. He was placed on Plavix, statin, and aspirin. Maze also suffers from heart failure most recent echocardiogram showed grade 1 diastolic dysfunction with EF of 30% which had improved from a previous EF of 20-25% in April 2018. He is currently prescribed Aldactone, Lasix, Coreg, and lisinopril. He follows Dr. Tresa Endo at [facility]. Today he reports that he is in stroke rehabilitation to correct his vision. He continues to experience double vision and has to struggle to refocus to see objects clearly. He denies any balance issues, headaches, or weakness. He reports that he has all of his medications and does not need any refills today. Social History   Social History  . Marital status: Divorced    Spouse name: N/A  . Number of children: N/A  . Years of education: N/A   Occupational History  . Not on file.   Social History Main Topics  . Smoking status: Former Smoker    Packs/day: 1.00    Years: 34.00    Types: Cigarettes    Quit date: 06/28/2016  . Smokeless tobacco: Never Used  . Alcohol use 15.6 oz/week    18 Cans of beer, 8 Shots of liquor per week  . Drug use: Yes     Comment: 02/10/2017 "experimented w/drugs; haven't done any for years"  .  Sexual activity: Not Currently   Other Topics Concern  . Not on file   Social History Narrative  . No narrative on file    Family History  Problem Relation Age of Onset  . Hypertension Mother   . Cardiomyopathy Mother    Review of Systems See history of present illness Patient Active Problem List   Diagnosis Date Noted  . Cerebral embolism with cerebral infarction 07/19/2017  . Hypertension 07/17/2017  . Vision disturbance 07/17/2017  . Non-ischemic cardiomyopathy (HCC) 03/09/2017  . Chronic combined systolic and diastolic CHF (congestive heart failure) (HCC) 02/10/2017  . Hypertensive urgency 02/10/2017  . Obesity 02/10/2017  . Tobacco abuse 02/10/2017  . Acute exacerbation of CHF (congestive heart failure) (HCC) 02/10/2017    No Known Allergies  Prior to Admission medications   Medication Sig Start Date End Date Taking? Authorizing Provider  aspirin 325 MG tablet Take 1 tablet (325 mg total) by mouth daily. 07/20/17  Yes Mikhail, Nita Sells, DO  atorvastatin (LIPITOR) 80 MG tablet Take 1 tablet (80 mg total) by mouth daily at 6 PM. 07/19/17  Yes Mikhail, Nita Sells, DO  clopidogrel (PLAVIX) 75 MG tablet Take 1 tablet (75 mg total) by mouth daily. 07/19/17 07/19/18 Yes Mikhail, Nita Sells, DO  furosemide (LASIX) 40 MG tablet Take 1 tablet (40 mg total) by mouth daily. 03/09/17  Yes Lennette Bihari, MD  potassium chloride SA (K-DUR,KLOR-CON) 20 MEQ tablet Take 1 tablet (20 mEq total) by mouth  daily. 03/09/17  Yes Lennette Bihari, MD  spironolactone (ALDACTONE) 25 MG tablet Take 1/2 tablet daily for 1 week then increase to 1/2 tablet twice a day. Patient taking differently: Take 25 mg by mouth daily.  04/21/17  Yes Lennette Bihari, MD  carvedilol (COREG) 12.5 MG tablet Take 1 tablet (12.5 mg total) by mouth 2 (two) times daily. 03/09/17 07/17/17  Lennette Bihari, MD  lisinopril (PRINIVIL,ZESTRIL) 10 MG tablet Take 1.5 tablets (15 mg total) by mouth daily. 04/21/17 07/20/17  Lennette Bihari, MD     Past Medical, Surgical Family and Social History reviewed and updated.    Objective:   Today's Vitals   08/04/17 0918 08/04/17 1006  BP: (!) 160/80 (!) 146/82  Pulse: 62   Resp: 16   Temp: 98.1 F (36.7 C)   TempSrc: Oral   SpO2: 99%   Weight: (!) 306 lb (138.8 kg)   Height: 5\' 8"  (1.727 m)     Wt Readings from Last 3 Encounters:  08/04/17 (!) 306 lb (138.8 kg)  07/18/17 (!) 301 lb 12.8 oz (136.9 kg)  04/21/17 (!) 302 lb (137 kg)   Physical Exam  Constitutional: He is oriented to person, place, and time. He appears well-developed and well-nourished.  HENT:  Head: Normocephalic and atraumatic.  Left Ear: External ear normal.  Eyes: Pupils are equal, round, and reactive to light. Conjunctivae and EOM are normal.  Neck: Normal range of motion. Neck supple.  Cardiovascular: Normal rate, normal heart sounds and intact distal pulses.   Pulmonary/Chest: Effort normal and breath sounds normal.  Abdominal: Soft. Bowel sounds are normal.  Musculoskeletal: Normal range of motion. He exhibits no edema.  Neurological: He is alert and oriented to person, place, and time.  Skin: Skin is warm and dry.  Psychiatric: He has a normal mood and affect. His behavior is normal. Judgment and thought content normal.   Assessment & Plan:  1. Essential hypertension, uncontrolled today. Recheck blood pressure improved.   Continue medications. Take consistently.  2. Visual-spatial impairment-continue rehabilitation 3. Chronic combined systolic and diastolic congestive heart failure-continue follow-up with cardiology and medications as prescribed.  Return for care in 2 months chronic conditions follow-up  Godfrey Pick. Tiburcio Pea, MSN, FNP-C [facility]  [address] [place], [place] [address] [phone]

## 2017-08-09 ENCOUNTER — Ambulatory Visit: Payer: Self-pay | Admitting: Occupational Therapy

## 2017-08-09 ENCOUNTER — Encounter: Payer: Self-pay | Admitting: Occupational Therapy

## 2017-08-09 DIAGNOSIS — R41842 Visuospatial deficit: Secondary | ICD-10-CM

## 2017-08-09 NOTE — Patient Instructions (Signed)
Home Exercise Program for your eyes/vision: Do these 3 times per day   1. Sit in a comfortable chair. Using a washcloth cover one eye. With the other eye follow a bright target by moving your eyes only (not your head).  You will move from the center out, center up and center down (You do not need to work on bringing your eyes in). Do 5 in each direction for each eye. You will want to alternate your eyes so they don't fatigue too quickly. Only go as far as you can and keep your eye on the target.  Do three sessions per day.  2. Sit ina comfortable chair. Place target on the wall at eye height (you will want a quiet corner with a blank wall). Close your eyes.  When you first open them you will see two. Try to make this into one and then hold the one vision for a slow count of 10.  Close your eyes.  Repeat 10 times.  Do 3 times per day.     If you get a headache behind your eyes, STOP the exercises immediately and close your eyes.  The headache should go away.  Wait 15 minutes and try again.   3.  Wear your regular glasses as much as possible. ONLY put your occlusion glasses on when absolutely necessary.  You will still want to wear them if walking in the community, reading, using the computer.  You want to work toward eventually (down the road) getting rid of the occlusion if possible.

## 2017-08-09 NOTE — Therapy (Signed)
[facility] [address] [place], [place], [address] Phone: [phone]   Fax:  [phone]  Occupational Therapy Treatment  Patient Details  Name: Narada Frees. MRN: [id number] Date of Birth: 01-22-1966 Referring Provider: Guerry Bruin  Encounter Date: 08/09/2017      OT End of Session - 08/09/17 1239    Visit Number 2   Number of Visits 8   Date for OT Re-Evaluation 08/30/17   Authorization Type no insurance - pt states that the SW in the hospital did assist him in applying for Medicaid. Pt understands that this may not be retroactive and that he may wait a significant period of time to hear. Pt wishes to proceed with therapies.  Also advised pt to request application for financial assistance.    OT Start Time 1017   OT Stop Time 1100   OT Time Calculation (min) 43 min   Activity Tolerance Patient tolerated treatment well      Past Medical History:  Diagnosis Date  . Acute CHF (congestive heart failure) (HCC) 02/10/2017  . Dyspnea   . Hypertension     Past Surgical History:  Procedure Laterality Date  . NO PAST SURGERIES    . RIGHT/LEFT HEART CATH AND CORONARY ANGIOGRAPHY N/A 02/12/2017   Procedure: Right/Left Heart Cath and Coronary Angiography;  Surgeon: Lyn Records, MD;  Location: [facility] INVASIVE CV LAB;  Service: Cardiovascular;  Laterality: N/A;    There were no vitals filed for this visit.      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 No/denies                      OT Treatments/Exercises (OP) - 08/09/17 0001      ADLs   Functional Mobility Addressed functional mobility in the clinic using regular glasses (vs partial occlusion glasses). Pt states he is  "afraid I will run into things" Pt without c/o of balance deficits, nausea or headache. Strongly encouraged pt to walk around own home with regular glasses and to work toward wearing occlusion glasses only as needed (currently pt wearing only 2 hours per day). Pt verbalized understanding.  Discussed wearing occlusion glasses when in community, reading, computer work, reading recipes.     ADL Comments Reviewed goals and POC and pt in agreement. Pt provided with written copy of goals.       Visual/Perceptual Exercises   Other Exercises Instructed pt in HEP for oculomotor exercises - pt with some improvement in alignment however at this time will need to complete exercises one eye at atime (occluding other eye).. Also instructed pt in using target on wall to begin to work on convergence. Pt now able to control alignment to move 2 objects into one with extra time and focus in midline. See pt instruction for details. Pt able to return demonstrate all.                 OT Education - 08/09/17 1233    Education provided Yes   Education Details HEP for oculomotor and convergence exercises.    Person(s) Educated Patient   Methods Explanation;Demonstration;Handout   Comprehension Verbalized understanding;Returned demonstration  OT Long Term Goals - 08/09/17 1236      OT LONG TERM GOAL #1   Title Pt will be mod I with HEP to address oculomotor exercises. - 08/30/2017   Baseline dependent   Status On-going     OT LONG TERM GOAL #2   Title Pt will demonstrate understanding of using occlusion as adaptation for specific tasks.   Baseline max cueing   Status On-going     OT LONG TERM GOAL #3   Title Pt will demonstrate ability to read large print without occlusion with AE prn   Baseline unable   Status On-going     OT LONG TERM GOAL #4   Title Pt will verbalize understanding regarding neuro opthamology referral   Baseline unable   Status On-going     OT LONG TERM GOAL #5    Title Pt will be mod I with environmental scannng within context of functional tasks without occlusion   Baseline wears occlusion on glasses 100% of the time.   Status On-going               Plan - 08/09/17 1236    Clinical Impression Statement Pt in agreement with goals and POC. Pt progressing toward goals. Pt with some improvement in eye alignment and ocular control   Rehab Potential Fair   OT Frequency 2x / week   OT Duration 4 weeks   OT Treatment/Interventions Self-care/ADL training;Therapeutic exercise;Therapeutic activities;Visual/perceptual remediation/compensation;Patient/family education   Plan check HEP, work on convergence, reading/scanning  with guides, environmental scanning   Consulted and Agree with Plan of Care Patient      Patient will benefit from skilled therapeutic intervention in order to improve the following deficits and impairments:  Impaired vision/preception  Visit Diagnosis: Visuospatial deficit    Problem List Patient Active Problem List   Diagnosis Date Noted  . Cerebral embolism with cerebral infarction 07/19/2017  . Hypertension 07/17/2017  . Vision disturbance 07/17/2017  . Non-ischemic cardiomyopathy (HCC) 03/09/2017  . Chronic combined systolic and diastolic CHF (congestive heart failure) (HCC) 02/10/2017  . Hypertensive urgency 02/10/2017  . Obesity 02/10/2017  . Tobacco abuse 02/10/2017  . Acute exacerbation of CHF (congestive heart failure) (HCC) 02/10/2017    Norton Pastel, OTR/L 08/09/2017, 12:40 PM   [facility] [address] [place], [place], [address] Phone: [phone]   Fax:  [phone]  Name: Frederik [place]. MRN: [id number] Date of Birth: 31-Jul-1966

## 2017-08-13 ENCOUNTER — Encounter: Payer: Self-pay | Admitting: Occupational Therapy

## 2017-08-16 ENCOUNTER — Ambulatory Visit: Payer: Self-pay | Admitting: Occupational Therapy

## 2017-08-16 ENCOUNTER — Encounter: Payer: Self-pay | Admitting: Occupational Therapy

## 2017-08-16 DIAGNOSIS — R41842 Visuospatial deficit: Secondary | ICD-10-CM

## 2017-08-16 NOTE — Therapy (Signed)
[facility] [address] [place], [place], [address] Phone: [phone]   Fax:  [phone]  Occupational Therapy Treatment  Patient Details  Name: Nicholas Kelly. MRN: [id number] Date of Birth: July 14, 1966 Referring Provider: Guerry Bruin  Encounter Date: 08/16/2017      OT End of Session - 08/16/17 1645    Visit Number 3   Number of Visits 8   Date for OT Re-Evaluation 08/30/17   Authorization Type no insurance - pt states that the SW in the hospital did assist him in applying for Medicaid. Pt understands that this may not be retroactive and that he may wait a significant period of time to hear. Pt wishes to proceed with therapies.  Also advised pt to request application for financial assistance.    OT Start Time 1202  pt arrived late   OT Stop Time 1230   OT Time Calculation (min) 28 min   Activity Tolerance Patient tolerated treatment well      Past Medical History:  Diagnosis Date  . Acute CHF (congestive heart failure) (HCC) 02/10/2017  . Dyspnea   . Hypertension     Past Surgical History:  Procedure Laterality Date  . NO PAST SURGERIES    . RIGHT/LEFT HEART CATH AND CORONARY ANGIOGRAPHY N/A 02/12/2017   Procedure: Right/Left Heart Cath and Coronary Angiography;  Surgeon: Lyn Records, MD;  Location: [facility] INVASIVE CV LAB;  Service: Cardiovascular;  Laterality: N/A;    There were no vitals filed for this visit.      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 No/denies                      OT Treatments/Exercises (OP) - 08/16/17 0001      Visual/Perceptual Exercises   Other Exercises Pt reports that he is doing HEP at home. Pt  reports he is now only wearing occlusion glasses for task specific reasons (i.e. reading, computer work). Pt states he initially had headaches when first stopped wearing them but that these have resovled. Pt came into clinic today with regular glasses on.  Addressed visual scanning at 77M, 110M, 2 M and 1.5 M. Pt unable at 1.5 M but able to do letter cancellation at all other levels.  Assessed impact of reading guides and pt with grealy improved ability to read with blue color. Pt states his mom uses reading guides at home and he can borrow hers.  Reading comprehension irmproved with reading guides as well.  Pt also given information on neuro opthamalogy evaluation and name , address and contact for local MD. Pt to call and make appt for approximately 4-5 months post stroke.  Pt with improving fields where he no longer has diplopia however remains signficant.                 OT Education - 08/16/17 1643    Education provided Yes   Education Details reading guides, neuro opthamalogy eval and referral   Person(s) Educated Patient   Methods Explanation;Demonstration;Handout   Comprehension Verbalized understanding;Returned demonstration  with guides             OT Long Term Goals -  08/16/17 1643      OT LONG TERM GOAL #1   Title Pt will be mod I with HEP to address oculomotor exercises. - 08/30/2017   Baseline dependent   Status On-going     OT LONG TERM GOAL #2   Title Pt will demonstrate understanding of using occlusion as adaptation for specific tasks.   Baseline max cueing   Status Achieved     OT LONG TERM GOAL #3   Title Pt will demonstrate ability to read large print without occlusion with AE prn   Baseline unable   Status Achieved     OT LONG TERM GOAL #4   Title Pt will verbalize understanding regarding neuro opthamology referral   Baseline unable   Status Achieved     OT LONG TERM GOAL #5   Title Pt will be mod I with environmental scannng within context of  functional tasks without occlusion   Baseline wears occlusion on glasses 100% of the time.   Status On-going               Plan - 08/16/17 1644    Clinical Impression Statement Pt progressing toward goals and reports decreased diplopia in certain fields.    OT Frequency 2x / week   OT Duration 4 weeks   OT Treatment/Interventions Self-care/ADL training;Therapeutic exercise;Therapeutic activities;Visual/perceptual remediation/compensation;Patient/family education   Plan upgrade HEP if possible, environmental scanning with functional tasks.    Consulted and Agree with Plan of Care Patient      Patient will benefit from skilled therapeutic intervention in order to improve the following deficits and impairments:  Impaired vision/preception  Visit Diagnosis: Visuospatial deficit    Problem List Patient Active Problem List   Diagnosis Date Noted  . Cerebral embolism with cerebral infarction 07/19/2017  . Hypertension 07/17/2017  . Vision disturbance 07/17/2017  . Non-ischemic cardiomyopathy (HCC) 03/09/2017  . Chronic combined systolic and diastolic CHF (congestive heart failure) (HCC) 02/10/2017  . Hypertensive urgency 02/10/2017  . Obesity 02/10/2017  . Tobacco abuse 02/10/2017  . Acute exacerbation of CHF (congestive heart failure) (HCC) 02/10/2017    Norton Pastel, OTR/L 08/16/2017, 4:48 PM  [facility] [address] [place], [place], [address] Phone: [phone]   Fax:  [phone]  Name: Nicholas Kelly. MRN: [id number] Date of Birth: Oct 20, 1966

## 2017-08-18 ENCOUNTER — Encounter: Payer: Self-pay | Admitting: Cardiovascular Disease

## 2017-08-18 ENCOUNTER — Ambulatory Visit (INDEPENDENT_AMBULATORY_CARE_PROVIDER_SITE_OTHER): Payer: Self-pay | Admitting: Cardiovascular Disease

## 2017-08-18 VITALS — BP 140/82 | HR 66 | Wt 310.0 lb

## 2017-08-18 DIAGNOSIS — H539 Unspecified visual disturbance: Secondary | ICD-10-CM

## 2017-08-18 DIAGNOSIS — I10 Essential (primary) hypertension: Secondary | ICD-10-CM

## 2017-08-18 DIAGNOSIS — I69398 Other sequelae of cerebral infarction: Secondary | ICD-10-CM

## 2017-08-18 DIAGNOSIS — I428 Other cardiomyopathies: Secondary | ICD-10-CM

## 2017-08-18 DIAGNOSIS — E785 Hyperlipidemia, unspecified: Secondary | ICD-10-CM

## 2017-08-18 DIAGNOSIS — I5042 Chronic combined systolic (congestive) and diastolic (congestive) heart failure: Secondary | ICD-10-CM

## 2017-08-18 MED ORDER — POTASSIUM CHLORIDE CRYS ER 20 MEQ PO TBCR: 20.0000 meq | EXTENDED_RELEASE_TABLET | Freq: Every day | ORAL | 3 refills | Status: DC

## 2017-08-18 MED ORDER — LISINOPRIL 20 MG PO TABS: 20.0000 mg | ORAL_TABLET | Freq: Every day | ORAL | 3 refills | Status: DC

## 2017-08-18 MED ORDER — SPIRONOLACTONE 25 MG PO TABS: 25.0000 mg | ORAL_TABLET | Freq: Every day | ORAL | 3 refills | Status: DC

## 2017-08-18 NOTE — Patient Instructions (Signed)
Medication Instructions:  INCREASE YOUR LISINOPRIL TO 20 MG DAILY   Labwork: NONE  Testing/Procedures: NONE  Follow-Up: Your physician recommends that you schedule a follow-up appointment in: IN February    If you need a refill on your cardiac medications before your next appointment, please call your pharmacy.

## 2017-08-18 NOTE — Progress Notes (Signed)
Cardiology Office Note    Date:  08/20/2017   ID:  [name], DOB 13-Aug-1966, MRN [id number]  PCP:  [name], FNP  Cardiologist:  [name], MD   Chief Complaint  Patient presents with  . Follow-up    pt wonders if Spironolactone gives his stomach issues     History of Present Illness:  Nicholas Kelly. is a 51 y.o. male who presents for a four-month follow-up evaluation of his  congestive heart failure.    Nicholas Kelly has a history of hypertension, and presented to [facility] hospital on 02/10/2017 from urgent care with acute onset of shortness of breath.  He was previously diagnosed with CHF in October 17 and had been on HCTZ but had run out of this prescription.  He was admitted by the hospitalist service.  A chest x-ray showed findings of CHF with pulmonary edema.  BNP was elevated at 752.  An echo Doppler study showed reduced LV function with an EF of 2025% with moderate concentric LVH, diffuse hypokinesis, grade 3 diastolic dysfunction, mild mitral regurgitation, moderate tricuspid regurgitation, and moderate bony hypertension with a PA pressure 51 mm.  Patient was seen in cardiology consultation.  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  He did not have significant obstructive CAD with only diffuse 30-50% mid and distal RCA stenoses.  There is moderate pulmonary hypertension.  He was discharged on 02/14/2017 and has been on lisinopril 5 mg, furosemide 40 mg, carvedilol 6.25 mg twice a day in addition to aspirin 81 mg, supplemental potassium 20, now quit once and atorvastatin 40 mg.     I saw for initial post hospital evaluation on 03/11/2017.  I reviewed his hospital data in its entirety.  He was feeling improved on his initial medical regimen and I recommended further titration of his lisinopril up to 10 mg  and increase carvedilol to 12.5 mg twice a day.  He did not have insurance and ws working on trying to obtain a Medicaid card.  We talked in the future about possibly switching him to entresto.  On his increase regimen, he has felt improved.    When I last saw him, his blood pressure continued to be elevated and I recommended the addition of spironolactone at 12.5 mg for one week and then to increase this to 12.5 mg twice a day.  I also titrated lisinopril from 10 mg to 15 mg. On July 17 2017.  He presented to the hospital with complaints of diplopia and visual disturbance. 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  An MRI of the range, did not show acute abnormality but revealed chronic ischemic microangiopathy.  He had a negative CTA, except for minimal calcified atherosclerosis involving only the carotids.  During his hospitalization.  A repeat echo Doppler study showed an EF of 30% with grade 1 diastolic dysfunction.  His aortic root was mildly dilated.  Neurology was consulted and he was felt to have had a likely CVA given his reduced LV function.  Over the last several weeks he has been undergoing outpatient nor oh rehabilitation.  He denies any chest pain.  He denies presyncope or syncope.  He presents for reevaluation.  Past Medical History:  Diagnosis Date  . Acute CHF (congestive heart  failure) ([place]) 02/10/2017  . Dyspnea   . Hypertension     Past Surgical History:  Procedure Laterality Date  . NO PAST SURGERIES    . RIGHT/LEFT HEART CATH AND CORONARY ANGIOGRAPHY N/A 02/12/2017   Procedure: Right/Left Heart Cath and Coronary Angiography;  Surgeon: Belva Crome, MD;  Location: [place] CV LAB;  Service: Cardiovascular;  Laterality: N/A;    Current Medications: Outpatient Medications Prior to Visit  Medication Sig Dispense Refill  . aspirin 325 MG tablet Take 1 tablet (325 mg total) by mouth daily.  30 tablet 3  . atorvastatin (LIPITOR) 80 MG tablet Take 1 tablet (80 mg total) by mouth daily at 6 PM. 30 tablet 0  . clopidogrel (PLAVIX) 75 MG tablet Take 1 tablet (75 mg total) by mouth daily. 30 tablet 5  . furosemide (LASIX) 40 MG tablet Take 1 tablet (40 mg total) by mouth daily. 30 tablet 11  . potassium chloride SA (K-DUR,KLOR-CON) 20 MEQ tablet Take 1 tablet (20 mEq total) by mouth daily. 30 tablet 11  . spironolactone (ALDACTONE) 25 MG tablet Take 1/2 tablet daily for 1 week then increase to 1/2 tablet twice a day. (Patient taking differently: Take 25 mg by mouth daily. ) 30 tablet 6  . carvedilol (COREG) 12.5 MG tablet Take 1 tablet (12.5 mg total) by mouth 2 (two) times daily. 60 tablet 11  . lisinopril (PRINIVIL,ZESTRIL) 10 MG tablet Take 1.5 tablets (15 mg total) by mouth daily. 45 tablet 11   No facility-administered medications prior to visit.      Allergies:   Patient has no known allergies.   Social History   Social History  . Marital status: Divorced    Spouse name: N/A  . Number of children: N/A  . Years of education: N/A   Social History Main Topics  . Smoking status: Former Smoker    Packs/day: 1.00    Years: 34.00    Types: Cigarettes    Quit date: 06/28/2016  . Smokeless tobacco: Never Used  . Alcohol use 15.6 oz/week    18 Cans of beer, 8 Shots of liquor per week  . Drug use: Yes     Comment: 02/10/2017 "experimented w/drugs; haven't done any for years"  . Sexual activity: Not Currently   Other Topics Concern  . None   Social History Narrative  . None     Family History:  The patient's family history includes Cardiomyopathy in his mother; Hypertension in his mother.   ROS General: Negative; No fevers, chills, or night sweats;  HEENT: positive for recent visual disturbance. sinus congestion, difficulty swallowing Pulmonary: Negative; No cough, wheezing, shortness of breath, hemoptysis Cardiovascular: see history of present illness, no chest pain.   No palpitations GI: Negative; No nausea, vomiting, diarrhea, or abdominal pain GU: Negative; No dysuria, hematuria, or difficulty voiding Musculoskeletal: Negative; no myalgias, joint pain, or weakness Hematologic/Oncology: Negative; no easy bruising, bleeding Endocrine: Negative; no heat/cold intolerance; no diabetes Neuro: positive for previous diplopia Skin: Negative; No rashes or skin lesions Psychiatric: Negative; No behavioral problems, depression Sleep: Negative; No snoring, daytime sleepiness, hypersomnolence, bruxism, restless legs, hypnogognic hallucinations, no cataplexy Other comprehensive 14 point system review is negative.   PHYSICAL EXAM:   VS:  BP 140/82 (BP Location: Left Arm)   Pulse 66   Wt (!) 310 lb (140.6 kg)   BMI 47.14 kg/m     Repeat blood pressure was 142/80  Wt Readings from Last 3 Encounters:  08/18/17 (!) 310 lb (  140.6 kg)  08/04/17 (!) 306 lb (138.8 kg)  07/18/17 (!) 301 lb 12.8 oz (136.9 kg)    General: Alert, oriented, no distress.  Skin: normal turgor, no rashes, warm and dry HEENT: Normocephalic, atraumatic. Pupils equal round and reactive to light; sclera anicteric; extraocular muscles intact;  Nose without nasal septal hypertrophy Mouth/Parynx benign; Mallinpatti scale 3 Neck: No JVD, no carotid bruits; normal carotid upstroke Lungs: clear to ausculatation and percussion; no wheezing or rales Chest wall: without tenderness to palpitation Heart: PMI not displaced, RRR, s1 s2 normal, 1/6 systolic murmur, no diastolic murmur, no rubs, gallops, thrills, or heaves Abdomen: soft, nontender; no hepatosplenomehaly, BS+; abdominal aorta nontender and not dilated by palpation. Back: no CVA tenderness Pulses 2+ Musculoskeletal: full range of motion, normal strength, no joint deformities Extremities: no clubbing cyanosis or edema, Homan's sign negative  Neurologic: grossly nonfocal; Cranial nerves grossly wnl Psychologic: Normal mood and  affect   Studies/Labs Reviewed:   ECG (independently read by me): Normal sinus rhythm at 66 bpm.  Right bundle branch block with repolarization changes.  Inferolateral T change.  T-wave changes.  LVH.  QTc interval 461 ms.  04/21/2017 EKG:  EKG is ordered today. Normal sinus rhythm at 77 bpm.  LVH with repolarization changes.  PR interval 184 ms.  QTc interval 488 ms.  03/11/2017 ECG (independently read by me): Normal sinus rhythm at 73 bpm.  Left axis deviation.  LVH by voltage criteria with repolarization changes.  Recent Labs: BMP Latest Ref Rng & Units 07/17/2017 07/17/2017 03/31/2017  Glucose 65 - 99 mg/dL 100(H) 99 86  BUN 6 - 20 mg/dL 22(H) 17 19  Creatinine 0.61 - 1.24 mg/dL 0.90 0.95 0.93  Sodium 135 - 145 mmol/L 140 135 144  Potassium 3.5 - 5.1 mmol/L 4.1 4.2 4.3  Chloride 101 - 111 mmol/L 103 103 109  CO2 22 - 32 mmol/L - 25 26  Calcium 8.9 - 10.3 mg/dL - 8.9 8.5(L)     Hepatic Function Latest Ref Rng & Units 07/17/2017 03/31/2017 02/14/2017  Total Protein 6.5 - 8.1 g/dL 7.3 6.6 7.2  Albumin 3.5 - 5.0 g/dL 3.7 3.6 3.4(L)  AST 15 - 41 U/L 33 19 28  ALT 17 - 63 U/L '26 19 28  ' Alk Phosphatase 38 - 126 U/L 53 51 49  Total Bilirubin 0.3 - 1.2 mg/dL 1.5(H) 1.1 1.5(H)  Bilirubin, Direct 0.1 - 0.5 mg/dL - - 0.2    CBC Latest Ref Rng & Units 07/17/2017 07/17/2017 02/12/2017  WBC 4.0 - 10.5 K/uL - 9.1 10.4  Hemoglobin 13.0 - 17.0 g/dL 15.0 14.4 14.4  Hematocrit 39.0 - 52.0 % 44.0 42.8 43.9  Platelets 150 - 400 K/uL - 241 236   Lab Results  Component Value Date   MCV 98.2 07/17/2017   MCV 101.2 (H) 02/12/2017   MCV 100.9 (H) 02/10/2017   No results found for: TSH Lab Results  Component Value Date   HGBA1C 5.0 07/18/2017     BNP    Component Value Date/Time   BNP 752.4 (H) 02/10/2017 1525    ProBNP No results found for: PROBNP   Lipid Panel     Component Value Date/Time   CHOL 163 07/18/2017 0558   TRIG 56 07/18/2017 0558   HDL 33 (L) 07/18/2017 0558   CHOLHDL  4.9 07/18/2017 0558   VLDL 11 07/18/2017 0558   LDLCALC 119 (H) 07/18/2017 0558     RADIOLOGY: No results found.   Additional studies/ records that were  reviewed today include:  I reviewed the patient's recent hospitalization, his cardiac consultation, cardiac catheterization report, and hospitalist records. I reviewed his most recent hospitalization from September 22 through 07/19/2017 including imaging studies and echocardiogram.   ASSESSMENT:    1. Non-ischemic cardiomyopathy ([place])   2. Chronic combined systolic and diastolic heart failure ([place])   3. CVA, old, disturbances of vision   4. Essential hypertension   5. Hyperlipidemia with target LDL less than 70   6. Morbid obesity ([place])      PLAN:  Nicholas Kelly is a 51 year old African-American gentleman who has a history of morbid obesity with BMI of 45.77.  In April 2018 he was admitted with acute systolic and diastolic congestive heart failure.  He was found to have an EF of 20-25%.  There was no significant coronary obstructive disease, although mild nonobstructive disease was present in his RCA.  There was evidence for moderate pulmonary hypertension.  When I initially saw him, I initiated therapy with ace inhibition and ultimately at follow-up at titrated lisinopril 2:15 milligrams and instituted spironolactone.  Slowly, he is now on spironolactone 1212.5 Mill grams twice a day, lisinopril 15 mg daily, furosemide 40 mg, carvedilol 12.5 mg twice a.  He is now on atorvastatin 80 mg following his recent neurologic event and is on Plavix and aspirin.  Apparently, neurology had increased his 81 mg dose 325 mg. He will be undergoing neurologic follow-up next week.  His most recent EF was 30%, which had improved slightly.  The pressure is elevated today.  I am further titrating lisinopril to 20 mg daily.  The future, plan is to initiate ARNI therapy with entresto. Repeat be met will be obtained in several weeks.  I will see him in 3 months  for cardiology reevaluation.   Medication Adjustments/Labs and Tests Ordered: Current medicines are reviewed at length with the patient today.  Concerns regarding medicines are outlined above.  Medication changes, Labs and Tests ordered today are listed in the Patientt Instructions below. Patient Instructions  Medication Instructions:  INCREASE YOUR LISINOPRIL TO 20 MG DAILY   Labwork: NONE  Testing/Procedures: NONE  Follow-Up: Your physician recommends that you schedule a follow-up appointment in: IN February    If you need a refill on your cardiac medications before your next appointment, please call your pharmacy.     Signed, [name], MD , [facility] 08/20/2017 6:53 PM    [facility] [address], [place], [place], [place]  [address] Phone: [phone]

## 2017-08-19 ENCOUNTER — Ambulatory Visit: Payer: Self-pay | Admitting: Occupational Therapy

## 2017-08-23 ENCOUNTER — Ambulatory Visit: Payer: Self-pay | Admitting: Occupational Therapy

## 2017-08-23 ENCOUNTER — Encounter: Payer: Self-pay | Admitting: Occupational Therapy

## 2017-08-23 DIAGNOSIS — R41842 Visuospatial deficit: Secondary | ICD-10-CM

## 2017-08-23 NOTE — Therapy (Signed)
[facility] [address] [place], [place], [address] Phone: [phone]   Fax:  [phone]  Occupational Therapy Treatment  Patient Details  Name: Nicholas Kelly. MRN: [id number] Date of Birth: December 22, 1965 Referring Provider: Guerry Bruin  Encounter Date: 08/23/2017      OT End of Session - 08/23/17 1518    Visit Number 4   Number of Visits 8   Date for OT Re-Evaluation 08/30/17   Authorization Type no insurance - pt states that the SW in the hospital did assist him in applying for Medicaid. Pt understands that this may not be retroactive and that he may wait a significant period of time to hear. Pt wishes to proceed with therapies.  Also advised pt to request application for financial assistance.    OT Start Time 1148   OT Stop Time 1216   OT Time Calculation (min) 28 min   Activity Tolerance Patient tolerated treatment well      Past Medical History:  Diagnosis Date  . Acute CHF (congestive heart failure) (HCC) 02/10/2017  . Dyspnea   . Hypertension     Past Surgical History:  Procedure Laterality Date  . NO PAST SURGERIES    . RIGHT/LEFT HEART CATH AND CORONARY ANGIOGRAPHY N/A 02/12/2017   Procedure: Right/Left Heart Cath and Coronary Angiography;  Surgeon: Lyn Records, MD;  Location: [facility] INVASIVE CV LAB;  Service: Cardiovascular;  Laterality: N/A;    There were no vitals filed for this visit.      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 No/denies                      OT Treatments/Exercises (OP) - 08/23/17 0001      ADLs   ADL Comments Reviewed goals with pt - see goal update. Pt reports that double vision is significantly improved. Pt has only worn occlusion glasses one  time since last visit for specific task.  With slow movement, pt only experiencing diplopia in R field starting with approximately 30* off midline.  Pt is now able to converge with time and effort in that space.  Rapid eye movement increases diplopia.     Visual/Perceptual Exercises   Other Exercises Upgraded HEP to now have pt do exercises each eye separately and then with both eyes, focusing on moving target slowly and making sure R eye moves into full abduction which can now do with effort. Pt able to self monitor this.  Also completed visual scanning at 441M and 1.41M level without occlusion glasses and pt with 100% accuracy. Pt reports that he is able to cook now without occlusion glasses.  Pt does continue to be light sensitive.                  OT Education - 08/23/17 1514    Education provided Yes   Education Details upgraded HEP for eye exercises   Person(s) Educated Patient   Methods Explanation;Demonstration;Verbal cues   Comprehension Verbalized understanding;Returned demonstration             OT Long Term Goals - 08/23/17 1515      OT LONG TERM GOAL #1   Title Pt will be mod I with HEP to  address oculomotor exercises. - 11/9//2018 date adjusted due to be placed on hold for brief period   Baseline dependent   Status On-going     OT LONG TERM GOAL #2   Title Pt will demonstrate understanding of using occlusion as adaptation for specific tasks.   Baseline max cueing   Status Achieved     OT LONG TERM GOAL #3   Title Pt will demonstrate ability to read large print without occlusion with AE prn   Baseline unable   Status Achieved  08/23/2017 able to scan at 1.5 M level     OT LONG TERM GOAL #4   Title Pt will verbalize understanding regarding neuro opthamology referral   Baseline unable   Status Achieved     OT LONG TERM GOAL #5   Title Pt will be mod I with environmental scannng within context of functional tasks without occlusion   Baseline wears occlusion  on glasses 100% of the time.   Status Achieved               Plan - 08/23/17 1515    Clinical Impression Statement Pt with excellent progress toward goals. Pt with much improved eye alignment and reduced diplopia   OT Frequency 2x / week   OT Duration 4 weeks   OT Treatment/Interventions Self-care/ADL training;Therapeutic exercise;Therapeutic activities;Visual/perceptual remediation/compensation;Patient/family education   Plan place on hold until 09/02/2017 to allow pt to work on [employer] and Agree with Plan of Care Patient      Patient will benefit from skilled therapeutic intervention in order to improve the following deficits and impairments:  Impaired vision/preception  Visit Diagnosis: Visuospatial deficit    Problem List Patient Active Problem List   Diagnosis Date Noted  . Cerebral embolism with cerebral infarction 07/19/2017  . Hypertension 07/17/2017  . Vision disturbance 07/17/2017  . Non-ischemic cardiomyopathy (HCC) 03/09/2017  . Chronic combined systolic and diastolic CHF (congestive heart failure) (HCC) 02/10/2017  . Hypertensive urgency 02/10/2017  . Obesity 02/10/2017  . Tobacco abuse 02/10/2017  . Acute exacerbation of CHF (congestive heart failure) (HCC) 02/10/2017    Norton Pastel, OTR/L 08/23/2017, 3:19 PM  [facility] [address] [place], [place], [address] Phone: [phone]   Fax:  [phone]  Name: Nicholas Kelly. MRN: [id number] Date of Birth: October 05, 1966

## 2017-08-26 ENCOUNTER — Encounter: Payer: Self-pay | Admitting: Occupational Therapy

## 2017-08-31 ENCOUNTER — Encounter: Payer: Self-pay | Admitting: Occupational Therapy

## 2017-09-02 ENCOUNTER — Ambulatory Visit: Payer: Self-pay | Attending: Neurology | Admitting: Occupational Therapy

## 2017-09-02 ENCOUNTER — Encounter: Payer: Self-pay | Admitting: Occupational Therapy

## 2017-09-02 DIAGNOSIS — R41842 Visuospatial deficit: Secondary | ICD-10-CM | POA: Insufficient documentation

## 2017-09-02 NOTE — Therapy (Signed)
[facility] [address] [place], [place], [address] Phone: [phone]   Fax:  [phone]  Occupational Therapy Treatment  Patient Details  Name: Nicholas Kelly. MRN: [id number] Date of Birth: 25-Mar-1966 Referring Provider: Harlow Asa   Encounter Date: 09/02/2017  OT End of Session - 09/02/17 1148    Visit Number  5    Number of Visits  8    Date for OT Re-Evaluation  08/30/17    Authorization Type  no insurance - pt states that the SW in the hospital did assist him in applying for Medicaid. Pt understands that this may not be retroactive and that he may wait a significant period of time to hear. Pt wishes to proceed with therapies.  Also advised pt to request application for financial assistance.     OT Start Time  1101    OT Stop Time  1125    OT Time Calculation (min)  24 min    Activity Tolerance  Patient tolerated treatment well       Past Medical History:  Diagnosis Date  . Acute CHF (congestive heart failure) ([place]) 02/10/2017  . Dyspnea   . Hypertension     Past Surgical History:  Procedure Laterality Date  . NO PAST SURGERIES      There were no vitals filed for this visit.  Subjective Assessment - 09/02/17 1105    Subjective   My vision keeps getting better - it is better when I am not tired.     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  No/denies                   OT Treatments/Exercises (OP) - 09/02/17 0001      Neurological Re-education Exercises   Other Exercises 2  Pt today reporting that visoin continues to report improvement in vision.  Today pt able to see single object in entire L field as well as approximately 40* in right field. Pt with improving ability for convergence in R fields with increased time. Upgraded HEP for vision - see pt instruction  section for details. Pt able to return demonstrate all activities.  Pt reports that he has only work occlusion glasses once since last visit when eyes were very tired and that he is now able to shave independently.  Pt reports he has returned to all but driving at this point and has neuro eye exam follow scheduled.  Pt is ready for discharge.              OT Education - 09/02/17 1146    Education provided  Yes    Education Details  upgraded HEP    Person(s) Educated  Patient    Methods  Explanation;Handout;Verbal cues    Comprehension  Verbalized understanding;Returned demonstration          OT Long Term Goals - 09/02/17 1147      OT LONG TERM GOAL #1   Title  Pt will be mod I with HEP to address oculomotor exercises. - 11/9//2018 date adjusted due to be placed on hold for brief period    Baseline  dependent    Status  Achieved      OT LONG TERM GOAL #2   Title  Pt will demonstrate understanding of using occlusion as adaptation for specific tasks.  Baseline  max cueing    Status  Achieved      OT LONG TERM GOAL #3   Title  Pt will demonstrate ability to read large print without occlusion with AE prn    Baseline  unable    Status  Achieved 08/23/2017 able to scan at 1.5 M level      OT LONG TERM GOAL #4   Title  Pt will verbalize understanding regarding neuro opthamology referral    Baseline  unable    Status  Achieved      OT LONG TERM GOAL #5   Title  Pt will be mod I with environmental scannng within context of functional tasks without occlusion    Baseline  wears occlusion on glasses 100% of the time.    Status  Achieved            Plan - 09/02/17 1147    Clinical Impression Statement  Pt has met all goals and is ready for discharge. Pt in agreement.    Rehab Potential  Fair    OT Frequency  2x / week    OT Duration  4 weeks    OT Treatment/Interventions  Self-care/ADL training;Therapeutic exercise;Therapeutic activities;Visual/perceptual  remediation/compensation;Patient/family education    Plan  d/ c from OT    Consulted and Agree with Plan of Care  Patient       Patient will benefit from skilled therapeutic intervention in order to improve the following deficits and impairments:  Impaired vision/preception  Visit Diagnosis: Visuospatial deficit    Problem List Patient Active Problem List   Diagnosis Date Noted  . Cerebral embolism with cerebral infarction 07/19/2017  . Hypertension 07/17/2017  . Vision disturbance 07/17/2017  . Non-ischemic cardiomyopathy ([place]) 03/09/2017  . Chronic combined systolic and diastolic CHF (congestive heart failure) ([place]) 02/10/2017  . Hypertensive urgency 02/10/2017  . Obesity 02/10/2017  . Tobacco abuse 02/10/2017  . Acute exacerbation of CHF (congestive heart failure) ([place]) 02/10/2017   OCCUPATIONAL THERAPY DISCHARGE SUMMARY  Visits from Start of Care: 3  Current functional level related to goals / functional outcomes: See above   Remaining deficits: Diplopia in R fields and with rapid eye movement   Education / Equipment HEP Plan: Patient agrees to discharge.  Patient goals were not met. Patient is being discharged due to meeting the stated rehab goals.  ?????      Quay Burow, OTR/L 09/02/2017, 11:49 AM  [facility] [address] [place], [place], [address] Phone: [phone]   Fax:  [phone]  Name: Nicholas Kelly. MRN: [id number] Date of Birth: 1966/05/02

## 2017-09-02 NOTE — Patient Instructions (Signed)
Add these to your home exercise program:  Do 2 -3 times per day.  1. Work on moving your eyes together in diagonal patterns.  Try and keep one image as you do this. Gradually increase the speed that you move the target as long as you can keep one image. Do 10 repetitions.  Do 3 times per day.  2.  Add convergence exercise. You can do this with your thumbs or two objects. Place on thumb arm's distance away and other thumb about half way. Focus first on the far one, then move eyes to the close target. Once you make this one, look at far target.  Each time you move your eyes, make the image one before you look at the other target. Do 10 times.  Do 3 times per day.   Keep doing all your other eye exercises.  Make sure you keep your follow up neuro eye appt  You have done great - keep up the great work!!

## 2017-09-20 ENCOUNTER — Encounter: Payer: Self-pay | Admitting: Neurology

## 2017-09-20 ENCOUNTER — Encounter (INDEPENDENT_AMBULATORY_CARE_PROVIDER_SITE_OTHER): Payer: Self-pay

## 2017-09-20 ENCOUNTER — Ambulatory Visit: Payer: Self-pay | Admitting: Neurology

## 2017-09-20 VITALS — BP 190/112 | HR 63 | Ht 68.0 in | Wt 312.4 lb

## 2017-09-20 DIAGNOSIS — I10 Essential (primary) hypertension: Secondary | ICD-10-CM

## 2017-09-20 DIAGNOSIS — I428 Other cardiomyopathies: Secondary | ICD-10-CM

## 2017-09-20 DIAGNOSIS — I6312 Cerebral infarction due to embolism of basilar artery: Secondary | ICD-10-CM

## 2017-09-20 DIAGNOSIS — E785 Hyperlipidemia, unspecified: Secondary | ICD-10-CM | POA: Insufficient documentation

## 2017-09-20 NOTE — Patient Instructions (Signed)
-   continue ASA 325mg  and plavix for another one month, and then discontinue plavix  - continue lipitor for stroke and cardiac prevention - follow up with cardiology Dr. Tresa Endo for heart treatment. Recommend to repeat 2D echo next visit in 10/2017.  - check BP at home and record and bring over to Dr. Tresa Endo for medication adjustment if needed.  - Follow up with your primary care physician for stroke risk factor modification. Recommend maintain blood pressure goal <130/80, diabetes with hemoglobin A1c goal below 7.0% and lipids with LDL cholesterol goal below 70 mg/dL.  - healthy diet and regular exercise - follow up in 4 months.

## 2017-09-20 NOTE — Progress Notes (Signed)
STROKE NEUROLOGY FOLLOW UP NOTE  NAME: Nicholas Fuseharles E Lindblad Jr. DOB: 01/27/1966  REASON FOR VISIT: stroke follow up HISTORY FROM: Patient and chart  Today we had the pleasure of seeing Nicholas Fuseharles E Siddiqi Jr. in follow-up at our Neurology Clinic. Pt was accompanied by no one.   History Summary [name] is a 51 y.o. male with history of morbid obesity, hypertension, hyperlipidemia and CHF admitted on 07/17/17 for diplopia and disequilibrium.  He was found to have disconjugate gaze, horizontal gaze palsy bilaterally, inward deviation bilaterally.  CT head showed old infarct at the right parietal lobe.  MRI showed no acute infarct but encephalomalacia at bilateral pontine and right parietal lobe.  It is assumed symptoms due to bilateral pontine infarct, however DWI and ADC has been normalized due to late presentation.  CT head and neck negative, TTE showed EF 60%, improved from 20-25% in 01/2017.  LDL 119 and A1c 5.0.  His stroke was considered embolic due to cardiomyopathy.  Due to improved EF at 30%, he was put on dual antiplatelet, and Lipitor 40 increased to 80.  Recommend repeat 2D echo in 2 months.  Interval History During the interval time, the patient has been doing better.  Able to have horizontal gaze bilaterally although with nystagmus, denies diplopia.  Follow with cardiology for CHF, titrating up lisinopril.  Has not repeat TTE at due to no insurance at this time.  He is working on the [employer] now.  BP today 190/112, stated that he has not taken his medication yet.  Started working on weight loss.   REVIEW OF SYSTEMS: Full 14 system review of systems performed and notable only for those listed below and in HPI above, all others are negative:  Constitutional:   Cardiovascular:  Ear/Nose/Throat:   Skin:  Eyes: Blurry vision Respiratory:   Gastroitestinal:   Genitourinary:  Hematology/Lymphatic:   Endocrine:  Musculoskeletal: Joint pain Allergy/Immunology:     Neurological:   Psychiatric:  Sleep: Sleep talking  The following represents the patient's updated allergies and side effects list: No Known Allergies  The neurologically relevant items on the patient's problem list were reviewed on today's visit.  Neurologic Examination  A problem focused neurological exam (12 or more points of the single system neurologic examination, vital signs counts as 1 point, cranial nerves count for 8 points) was performed.  Blood pressure (!) 190/112, pulse 63, height 5\' 8"  (1.727 m), weight (!) 312 lb 6.4 oz (141.7 kg).  General - morbid obesity, well developed, in no apparent distress.  Ophthalmologic - Fundi not visualized due to noncooperation.  Cardiovascular - Regular rate and rhythm.  Mental Status -  Level of arousal and orientation to time, place, and person were intact. Language including expression, naming, repetition, comprehension was assessed and found intact. Attention span and concentration were normal. Fund of Knowledge was assessed and was intact.  Cranial Nerves II - XII - II - Visual field intact OU. III, IV, VI - bilateral mild inward gaze, but able to have horizontal gaze bilaterally but with contralateral eye nystagmus on horizontal gaze.  Upward downward gaze symmetrical, no diplopia. V - Facial sensation intact bilaterally. VII - Facial movement intact bilaterally. VIII - Hearing & vestibular intact bilaterally. X - Palate elevates symmetrically. XI - Chin turning & shoulder shrug intact bilaterally. XII - Tongue protrusion intact.  Motor Strength - The patient's strength was normal in all extremities and pronator drift was absent.  Bulk was normal and fasciculations were  absent.   Motor Tone - Muscle tone was assessed at the neck and appendages and was normal.  Reflexes - The patient's reflexes were 1+ in all extremities and he had no pathological reflexes.  Sensory - Light touch, temperature/pinprick were assessed and  were normal.    Coordination - The patient had normal movements in the hands and feet with no ataxia or dysmetria.  Tremor was absent.  Gait and Station - The patient's transfers, posture, gait, station, and turns were observed as normal.   Functional score  mRS = 1   0 - No symptoms.   1 - No significant disability. Able to carry out all usual activities, despite some symptoms.   2 - Slight disability. Able to look after own affairs without assistance, but unable to carry out all previous activities.   3 - Moderate disability. Requires some help, but able to walk unassisted.   4 - Moderately severe disability. Unable to attend to own bodily needs without assistance, and unable to walk unassisted.   5 - Severe disability. Requires constant nursing care and attention, bedridden, incontinent.   6 - Dead.   NIH Stroke Scale = 0    Data reviewed: I personally reviewed the images and agree with the radiology interpretations.  Ct Head Wo Contrast 07/17/2017 IMPRESSION:  No acute intracranial abnormality. Old insult in the right parietal lobe. Nonspecific white matter changes may represent chronic small vessel ischemic disease.   Mr Nicholas Kelly And Wo Contrast 07/17/2017 IMPRESSION:  1. No acute abnormality. However, I read with Dr. Margo Aye, Radiologist, there were encephalomalacia at bilateral pontine, right parietal region corresponding to CT findings. 2. Chronic ischemic microangiopathy.   Ct Angio Head W Or Wo Contrast Ct Angio Neck W Or Wo Contrast 07/18/2017  IMPRESSION:  1. Negative CTA Head and Neck. Minimal calcified atherosclerosis affecting only the carotids. - Mild vessel tortuosity/ectasia.  2. Stable CT appearance of the brain.  3. Poor dentition  TTE - Mildly dilated LV with mild LV hypertrophy. EF 30% with diffuse hypokinesis. Normal RV size and systolic function. No significant valvular abnormalities.  Component     Latest Ref Rng & Units 03/31/2017  07/18/2017  Cholesterol     0 - 200 mg/dL 960 454  Triglycerides     <150 mg/dL 61 56  HDL Cholesterol     >40 mg/dL 42 33 (L)  Total CHOL/HDL Ratio     RATIO 2.5 4.9  VLDL     0 - 40 mg/dL 12 11  LDL (calc)     0 - 99 mg/dL 53 098 (H)  Hemoglobin A1C     4.8 - 5.6 %  5.0  Mean Plasma Glucose     mg/dL  11.9    Assessment: As you may recall, he is a 51 y.o. African American male with PMH of morbid obesity, HTN, HLD, and CHF admitted on 07/17/17 for diplopia, disconjugate gaze, horizontal gaze palsy bilaterally, inward deviation bilaterally.  CT head showed old infarct at the right parietal lobe.  MRI showed no acute infarct but encephalomalacia at bilateral pontine and right parietal lobe.  It is assumed symptoms due to bilateral pontine infarct, however DWI and ADC has been normalized due to late presentation.  CT head and neck negative, TTE showed EF 60%, improved from 20-25% in 01/2017.  LDL 119 and A1c 5.0.  His stroke was considered embolic due to cardiomyopathy.  Due to improved EF at 30%, he was put on dual antiplatelet, and  Lipitor 40 increased to 80.  During the interval time, the patient has been doing better.  Able to have horizontal gaze bilaterally although with nystagmus, denies diplopia.  Follow with cardiology for CHF. Has not repeat TTE yet due to no insurance at this time.  BP still high.   Plan:  - continue ASA 325mg  and plavix for another one month, and then discontinue plavix  - continue lipitor for stroke and cardiac prevention - follow up with cardiology Dr. Tresa Endo for CHF treatment.  - will consider repeat 2D echo and cardiac monitoring once has insurance.  - check BP at home and record and bring over to Dr. Tresa Endo for medication adjustment if needed.  - Follow up with your primary care physician for stroke risk factor modification. Recommend maintain blood pressure goal <130/80, diabetes with hemoglobin A1c goal below 7.0% and lipids with LDL cholesterol goal below 70  mg/dL.  - healthy diet and regular exercise - follow up in 4 months.   I spent more than 25 minutes of face to face time with the patient. Greater than 50% of time was spent in counseling and coordination of care. We discussed CHF treatment, repeat TTE and consider heart monitoring once having insurance, BP management.   No orders of the defined types were placed in this encounter.   No orders of the defined types were placed in this encounter.   Patient Instructions  - continue ASA 325mg  and plavix for another one month, and then discontinue plavix  - continue lipitor for stroke and cardiac prevention - follow up with cardiology Dr. Tresa Endo for heart treatment. Recommend to repeat 2D echo next visit in 10/2017.  - check BP at home and record and bring over to Dr. Tresa Endo for medication adjustment if needed.  - Follow up with your primary care physician for stroke risk factor modification. Recommend maintain blood pressure goal <130/80, diabetes with hemoglobin A1c goal below 7.0% and lipids with LDL cholesterol goal below 70 mg/dL.  - healthy diet and regular exercise - follow up in 4 months.     Marvel Plan, MD PhD [facility] [address] [place], [place] [address] [phone]

## 2017-09-21 ENCOUNTER — Telehealth: Payer: Self-pay | Admitting: Cardiovascular Disease

## 2017-09-21 ENCOUNTER — Other Ambulatory Visit: Payer: Self-pay | Admitting: Cardiovascular Disease

## 2017-09-21 DIAGNOSIS — E785 Hyperlipidemia, unspecified: Secondary | ICD-10-CM

## 2017-09-21 MED ORDER — HYDROCHLOROTHIAZIDE 25 MG PO TABS: 25.0000 mg | ORAL_TABLET | Freq: Every day | ORAL | 1 refills | Status: DC

## 2017-09-21 MED ORDER — CLOPIDOGREL BISULFATE 75 MG PO TABS: 75.0000 mg | ORAL_TABLET | Freq: Every day | ORAL | 1 refills | Status: AC

## 2017-09-21 MED ORDER — CLOPIDOGREL BISULFATE 75 MG PO TABS: 75.0000 mg | ORAL_TABLET | Freq: Every day | ORAL | 1 refills | Status: DC

## 2017-09-21 MED ORDER — CARVEDILOL 12.5 MG PO TABS: 12.5000 mg | ORAL_TABLET | Freq: Two times a day (BID) | ORAL | 1 refills | Status: DC

## 2017-09-21 MED ORDER — CLONIDINE HCL 0.1 MG PO TABS: 0.1000 mg | ORAL_TABLET | Freq: Two times a day (BID) | ORAL | 1 refills | Status: DC

## 2017-09-21 MED ORDER — POTASSIUM CHLORIDE ER 20 MEQ PO TBCR: 1.0000 | EXTENDED_RELEASE_TABLET | Freq: Every day | ORAL | 1 refills | Status: DC

## 2017-09-21 MED ORDER — FUROSEMIDE 40 MG PO TABS: 40.0000 mg | ORAL_TABLET | Freq: Every day | ORAL | 1 refills | Status: DC

## 2017-09-21 NOTE — Telephone Encounter (Signed)
Pt calling saying all his medicine today was called to the wrong pharmacy. Please call it to [facility] [phone].i

## 2017-09-21 NOTE — Telephone Encounter (Signed)
New message    Needs all medication switched to [facility]    *STAT* If patient is at the pharmacy, call can be transferred to refill team.   1. Which medications need to be refilled? (please list name of each medication and dose if known) furosemide (LASIX) 40 MG tablet carvedilol (COREG) 12.5 MG tablet(Expired) Take 1 tablet (12.5 mg total) by mouth 2 (two) times daily.  cloNIDine (CATAPRES) 0.1 MG tablet clopidogrel (PLAVIX) 75 MG tablet hydrochlorothiazide (HYDRODIURIL) 25 MG tablet potassium chloride SA (K-DUR,KLOR-CON) 20 MEQ tablet 2. Which pharmacy/location (including street and city if local pharmacy) is medication to be sent to? [facility]   3. Do they need a 30 day or 90 day supply?  90

## 2017-10-04 ENCOUNTER — Ambulatory Visit: Payer: Self-pay | Admitting: Family Medicine

## 2017-12-21 ENCOUNTER — Ambulatory Visit: Payer: Self-pay | Admitting: Cardiovascular Disease

## 2018-01-24 ENCOUNTER — Ambulatory Visit: Payer: Self-pay | Admitting: Neurology

## 2018-01-24 ENCOUNTER — Encounter: Payer: Self-pay | Admitting: Neurology

## 2018-01-25 ENCOUNTER — Telehealth: Payer: Self-pay

## 2018-01-25 NOTE — Telephone Encounter (Signed)
Patient no show for appt 01/24/2018.

## 2018-01-31 ENCOUNTER — Encounter: Payer: Self-pay | Admitting: Cardiovascular Disease

## 2018-01-31 ENCOUNTER — Ambulatory Visit (INDEPENDENT_AMBULATORY_CARE_PROVIDER_SITE_OTHER): Payer: Self-pay | Admitting: Cardiovascular Disease

## 2018-01-31 VITALS — BP 148/102 | HR 74 | Ht 68.0 in | Wt 273.4 lb

## 2018-01-31 DIAGNOSIS — Z79899 Other long term (current) drug therapy: Secondary | ICD-10-CM

## 2018-01-31 DIAGNOSIS — I5042 Chronic combined systolic (congestive) and diastolic (congestive) heart failure: Secondary | ICD-10-CM

## 2018-01-31 DIAGNOSIS — I428 Other cardiomyopathies: Secondary | ICD-10-CM

## 2018-01-31 DIAGNOSIS — E785 Hyperlipidemia, unspecified: Secondary | ICD-10-CM

## 2018-01-31 DIAGNOSIS — I2510 Atherosclerotic heart disease of native coronary artery without angina pectoris: Secondary | ICD-10-CM

## 2018-01-31 MED ORDER — ASPIRIN 81 MG PO TABS
81.0000 mg | ORAL_TABLET | Freq: Every day | ORAL | 3 refills | Status: AC
Start: 2018-01-31 — End: ?

## 2018-01-31 MED ORDER — HYDROCHLOROTHIAZIDE 25 MG PO TABS: 12.5000 mg | ORAL_TABLET | Freq: Every day | ORAL | 1 refills | Status: DC

## 2018-01-31 MED ORDER — SACUBITRIL-VALSARTAN 49-51 MG PO TABS: 1.0000 | ORAL_TABLET | Freq: Two times a day (BID) | ORAL | 3 refills | Status: DC

## 2018-01-31 NOTE — Progress Notes (Signed)
Cardiology Office Note    Date:  01/31/2018   ID:  Nicholas Kelly., DOB October 08, 1966, MRN [id number]  PCP:  [name], FNP  Cardiologist:  [name], MD   No chief complaint on file.   History of Present Illness:  Nicholas Kelly. is a 52 y.o. male who presents for a 6 month follow-up evaluation.   [name] has a history of hypertension, and presented to [facility] hospital on 02/10/2017 from urgent care with acute onset of shortness of breath.  He was previously diagnosed with CHF in October 17 and had been on HCTZ but had run out of this prescription.  He was admitted by the hospitalist service.  A chest x-ray showed findings of CHF with pulmonary edema.  BNP was elevated at 752.  An echo Doppler study showed reduced LV function with an EF of 2025% with moderate concentric LVH, diffuse hypokinesis, grade 3 diastolic dysfunction, mild mitral regurgitation, moderate tricuspid regurgitation, and moderate bony hypertension with a PA pressure 51 mm.  Patient was seen in cardiology consultation.  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  He did not have significant obstructive CAD with only diffuse 30-50% mid and distal RCA stenoses.  There is moderate pulmonary hypertension.  He was discharged on 02/14/2017 and has been on lisinopril 5 mg, furosemide 40 mg, carvedilol 6.25 mg twice a day in addition to aspirin 81 mg, supplemental potassium 20, now quit once and atorvastatin 40 mg.     I saw for initial post hospital evaluation on 03/11/2017.  I reviewed his hospital data in its entirety.  He was feeling improved on his initial medical regimen and I recommended further titration of his lisinopril up to 10 mg and increase carvedilol to 12.5 mg twice a day.  He did not have insurance and ws working on trying to obtain a Medicaid  card.  We talked in the future about possibly switching him to entresto.  On his increase regimen, he has felt improved.    When I saw him in June 2018 his blood pressure continued to be elevated and I recommended the addition of spironolactone at 12.5 mg for one week and then to increase this to 12.5 mg twice a day.  I also titrated lisinopril from 10 mg to 15 mg. On July 17, 2017 he presented to the hospital with complaints of diplopia and visual disturbance. 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  An MRI of the range, did not show acute abnormality but revealed chronic ischemic microangiopathy.  He had a negative CTA, except for minimal calcified atherosclerosis involving only the carotids.  During his hospitalization.  A repeat echo Doppler study showed an EF of 30% with grade 1 diastolic dysfunction.  His aortic root was mildly dilated.  Neurology was consulted and he was felt to have had a likely CVA given his reduced LV function.  He underwent outpatient neurologic rehab.  I last saw him in October 2018.  At that time, the pressure was still elevated and I further titrated lisinopril to 20 mg.  He was also taking Spironolactone 12.5 mg twice a day, furosemide 40 mg daily, carvedilol 12.5 twice daily in addition to atorvastatin 80 mg and Plavix and aspirin.  Apparently, he tells me he  ran out of lisinopril for the last 2 months.  He also is no longer taking spironolactone.  Allergist had increased his aspirin to 325 but he continues to be on Plavix 75 mg, carvedilol 12.5 twice daily, furosemide 40 mg and HCTZ 25 mg.  He has lost approximately 35 pounds since October 2018.  He denies recurrent anginal type symptoms or neurologic events.  He presents for evaluation.  Past Medical History:  Diagnosis Date  . Acute CHF (congestive heart failure) ([place]) 02/10/2017  . Dyspnea   . Hypertension   . Stroke [facility])      Past Surgical History:  Procedure Laterality Date  . NO PAST SURGERIES    . RIGHT/LEFT HEART CATH AND CORONARY ANGIOGRAPHY N/A 02/12/2017   Procedure: Right/Left Heart Cath and Coronary Angiography;  Surgeon: Belva Crome, MD;  Location: [place] CV LAB;  Service: Cardiovascular;  Laterality: N/A;    Current Medications: Outpatient Medications Prior to Visit  Medication Sig Dispense Refill  . atorvastatin (LIPITOR) 80 MG tablet Take 1 tablet (80 mg total) by mouth daily at 6 PM. 30 tablet 0  . carvedilol (COREG) 12.5 MG tablet Take 12.5 mg by mouth 2 (two) times daily with a meal.    . cloNIDine (CATAPRES) 0.1 MG tablet Take 1 tablet (0.1 mg total) by mouth 2 (two) times daily. 180 tablet 1  . clopidogrel (PLAVIX) 75 MG tablet Take 1 tablet (75 mg total) by mouth daily. 90 tablet 1  . furosemide (LASIX) 40 MG tablet Take 1 tablet (40 mg total) by mouth daily. 90 tablet 1  . Potassium Chloride ER 20 MEQ TBCR Take 1 tablet by mouth daily. 180 tablet 1  . aspirin 325 MG tablet Take 1 tablet (325 mg total) by mouth daily. 30 tablet 3  . hydrochlorothiazide (HYDRODIURIL) 25 MG tablet Take 1 tablet (25 mg total) by mouth daily. 90 tablet 1  . carvedilol (COREG) 12.5 MG tablet Take 1 tablet (12.5 mg total) by mouth 2 (two) times daily. 180 tablet 1  . lisinopril (PRINIVIL,ZESTRIL) 20 MG tablet Take 1 tablet (20 mg total) by mouth daily. 90 tablet 3  . potassium chloride SA (K-DUR,KLOR-CON) 20 MEQ tablet Take 1 tablet (20 mEq total) by mouth daily. 90 tablet 3  . spironolactone (ALDACTONE) 25 MG tablet Take 1 tablet (25 mg total) by mouth daily. 90 tablet 3   No facility-administered medications prior to visit.      Allergies:   Patient has no known allergies.   Social History   Socioeconomic History  . Marital status: Divorced    Spouse name: Not on file  . Number of children: Not on file  . Years of education: Not on file  . Highest education level: Not on file  Occupational  History  . Not on file  Social Needs  . Financial resource strain: Not on file  . Food insecurity:    Worry: Not on file    Inability: Not on file  . Transportation needs:    Medical: Not on file    Non-medical: Not on file  Tobacco Use  . Smoking status: Former Smoker    Packs/day: 1.00    Years: 34.00    Pack years: 34.00    Types: Cigarettes    Last attempt to quit: 06/28/2016    Years since quitting: 1.5  . Smokeless tobacco: Never Used  Substance and Sexual Activity  . Alcohol use: Yes    Alcohol/week: 4.8 oz    Types:  8 Shots of liquor per week    Comment: occssionally  . Drug use: Yes    Comment: 02/10/2017 "experimented w/drugs; haven't done any for years"  . Sexual activity: Not Currently  Lifestyle  . Physical activity:    Days per week: Not on file    Minutes per session: Not on file  . Stress: Not on file  Relationships  . Social connections:    Talks on phone: Not on file    Gets together: Not on file    Attends religious service: Not on file    Active member of club or organization: Not on file    Attends meetings of clubs or organizations: Not on file    Relationship status: Not on file  Other Topics Concern  . Not on file  Social History Narrative  . Not on file     Family History:  The patient's family history includes Cardiomyopathy in his mother; Hypertension in his mother; Stroke in his maternal aunt.   ROS General: Negative; No fevers, chills, or night sweats;  HEENT: positive for recent visual disturbance. sinus congestion, difficulty swallowing Pulmonary: Negative; No cough, wheezing, shortness of breath, hemoptysis Cardiovascular: see history of present illness, no chest pain.  No palpitations GI: Negative; No nausea, vomiting, diarrhea, or abdominal pain GU: Negative; No dysuria, hematuria, or difficulty voiding Musculoskeletal: Negative; no myalgias, joint pain, or weakness Hematologic/Oncology: Negative; no easy bruising,  bleeding Endocrine: Negative; no heat/cold intolerance; no diabetes Neuro: positive for previous diplopia Skin: Negative; No rashes or skin lesions Psychiatric: Negative; No behavioral problems, depression Sleep: Negative; No snoring, daytime sleepiness, hypersomnolence, bruxism, restless legs, hypnogognic hallucinations, no cataplexy Other comprehensive 14 point system review is negative.   PHYSICAL EXAM:   VS:  BP (!) 148/102   Pulse 74   Ht 5' 8" (1.727 m)   Wt 273 lb 6.4 oz (124 kg)   BMI 41.57 kg/m     Peak blood pressure was 128/88 by me  Wt Readings from Last 3 Encounters:  01/31/18 273 lb 6.4 oz (124 kg)  09/20/17 (!) 312 lb 6.4 oz (141.7 kg)  08/18/17 (!) 310 lb (140.6 kg)    General: Alert, oriented, no distress.  Skin: normal turgor, no rashes, warm and dry HEENT: Normocephalic, atraumatic. Pupils equal round and reactive to light; sclera anicteric; extraocular muscles intact;  Nose without nasal septal hypertrophy Mouth/Parynx benign; Mallinpatti scale 3 Neck: No JVD, no carotid bruits; normal carotid upstroke Lungs: clear to ausculatation and percussion; no wheezing or rales Chest wall: without tenderness to palpitation Heart: PMI not displaced, RRR, s1 s2 normal, 1/6 systolic murmur, no diastolic murmur, no rubs, gallops, thrills, or heaves Abdomen: Moderate central adiposity soft, nontender; no hepatosplenomehaly, BS+; abdominal aorta nontender and not dilated by palpation. Back: no CVA tenderness Pulses 2+ Musculoskeletal: full range of motion, normal strength, no joint deformities Extremities: no clubbing cyanosis or edema, Homan's sign negative  Neurologic: grossly nonfocal; Cranial nerves grossly wnl Psychologic: Normal mood and affect   Studies/Labs Reviewed:   ECG (independently read by me): Normal sinus rhythm at 74 bpm.  Left axis deviation.  Right bundle branch block.  LVH by voltage criteria.  QTc interval 492 ms.  August 18 2017 ECG  (independently read by me): Normal sinus rhythm at 66 bpm.  Right bundle branch block with repolarization changes.  Inferolateral T change.  T-wave changes.  LVH.  QTc interval 461 ms.  04/21/2017 EKG:  EKG is ordered today. Normal sinus rhythm at  77 bpm.  LVH with repolarization changes.  PR interval 184 ms.  QTc interval 488 ms.  03/11/2017 ECG (independently read by me): Normal sinus rhythm at 73 bpm.  Left axis deviation.  LVH by voltage criteria with repolarization changes.  Recent Labs: BMP Latest Ref Rng & Units 07/17/2017 07/17/2017 03/31/2017  Glucose 65 - 99 mg/dL 100(H) 99 86  BUN 6 - 20 mg/dL 22(H) 17 19  Creatinine 0.61 - 1.24 mg/dL 0.90 0.95 0.93  Sodium 135 - 145 mmol/L 140 135 144  Potassium 3.5 - 5.1 mmol/L 4.1 4.2 4.3  Chloride 101 - 111 mmol/L 103 103 109  CO2 22 - 32 mmol/L - 25 26  Calcium 8.9 - 10.3 mg/dL - 8.9 8.5(L)     Hepatic Function Latest Ref Rng & Units 07/17/2017 03/31/2017 02/14/2017  Total Protein 6.5 - 8.1 g/dL 7.3 6.6 7.2  Albumin 3.5 - 5.0 g/dL 3.7 3.6 3.4(L)  AST 15 - 41 U/L 33 19 28  ALT 17 - 63 U/L _0 Alk Phosphatase 38 - 126 U/L 53 51 49  Total Bilirubin 0.3 - 1.2 mg/dL 1.5(H) 1.1 1.5(H)  Bilirubin, Direct 0.1 - 0.5 mg/dL - - 0.2    CBC Latest Ref Rng & Units 07/17/2017 07/17/2017 02/12/2017  WBC 4.0 - 10.5 K/uL - 9.1 10.4  Hemoglobin 13.0 - 17.0 g/dL 15.0 14.4 14.4  Hematocrit 39.0 - 52.0 % 44.0 42.8 43.9  Platelets 150 - 400 K/uL - 241 236   Lab Results  Component Value Date   MCV 98.2 07/17/2017   MCV 101.2 (H) 02/12/2017   MCV 100.9 (H) 02/10/2017   No results found for: TSH Lab Results  Component Value Date   HGBA1C 5.0 07/18/2017     BNP    Component Value Date/Time   BNP 752.4 (H) 02/10/2017 1525    ProBNP No results found for: PROBNP   Lipid Panel     Component Value Date/Time   CHOL 163 07/18/2017 0558   TRIG 56 07/18/2017 0558   HDL 33 (L) 07/18/2017 0558   CHOLHDL 4.9 07/18/2017 0558   VLDL 11 07/18/2017  0558   LDLCALC 119 (H) 07/18/2017 0558     RADIOLOGY: No results found.   Additional studies/ records that were reviewed today include:  I reviewed the patient's recent hospitalization, his cardiac consultation, cardiac catheterization report, and hospitalist records. I reviewed his most recent hospitalization from September 22 through 07/19/2017 including imaging studies and echocardiogram.   ASSESSMENT:    1. Non-ischemic cardiomyopathy ([facility])   2. Chronic combined systolic and diastolic heart failure ([facility])   3. Medication management   4. Morbid obesity ([facility])   5. CAD in native artery   6. Hyperlipidemia with target LDL less than 70      PLAN:  [name] is a 52 year old African-American gentleman who has a history of morbid obesity with BMI of 45.77.  Since I last saw him, he has lost 37 pounds purposefully with improved diet.  In April 2018 he was admitted with acute systolic and diastolic congestive heart failure.  He was found to have an EF of 20-25%.  There was no significant coronary obstructive disease, although mild nonobstructive disease was present in his RCA.  There was evidence for moderate pulmonary hypertension.  When I initially saw him, I initiated therapy with ACE-inhibition and ultimately titrated lisinopril, spironolactone, and carvedilol.  We had discussed transition to ARNI therapy.  He is still working on getting his Medicaid card.  Apparently since I last saw him, he stopped lisinopril when he had run out.  In addition he does not appear that he is on Spironolactone any further.  He was concerned that this may have caused some stomach issues in the past.  His blood pressure was elevated on presentation but improved on recheck by me.  Since he has been off ACE inhibition, I am providing him with samples of Entresto 49/51 mg twice daily.  I am recommending he see a pharmacist group in 2-3 weeks prior to that evaluation repeat laboratory will be obtained.  I have  recommended he reduce his aspirin to 81 mg and reduce his HCTZ to 12.5 mg the moderate dose of Entresto.  We did not have samples of the 24/26 in the office today.  Ultimately, the plan will be to optimize Entresto treatment hopefully up to 97/103 mg twice daily.  In the future follow-up echo Doppler assessment will be made to reassess LV function.  Commended him on his weight loss but reiterated to him that he is still morbidly obese and significant weight loss is still necessary.  He continues to be on atorvastatin 80 mg for hyperlipidemia.  He is tolerating this without myalgias.  It does not appear that he has had recent lipid studies did since September 2018, this should be reassessed at his follow-up labs.  I will see him in approximately 6-8 weeks for reevaluation.  Medication Adjustments/Labs and Tests Ordered: Current medicines are reviewed at length with the patient today.  Concerns regarding medicines are outlined above.  Medication changes, Labs and Tests ordered today are listed in the Patientt Instructions below. Patient Instructions  Medication Instructions:  START Entresto 49/51 mg two times daily  DECREASE HCTZ 12.5 mg daily  DECREASE aspirin to 81 mg daily  Labwork: Please return for labs in 2 weeks (CMET,ProBNP)  Our in office lab hours are Monday-Friday 8:00-4:00, closed for lunch 12:45-1:45 pm.  No appointment needed.  Follow-Up: 2-3 weeks with pharmacist [name] titration)  2 months with Dr. Claiborne Billings  Any Other Special Instructions Will Be Listed Below (If Applicable).     If you need a refill on your cardiac medications before your next appointment, please call your pharmacy.      Signed, [name], MD , [facility] 01/31/2018 7:02 PM    [facility] [address], [place], [place], [place]  [address] Phone: [phone]

## 2018-01-31 NOTE — Patient Instructions (Signed)
Medication Instructions:  START Entresto 49/51 mg two times daily  DECREASE HCTZ 12.5 mg daily  DECREASE aspirin to 81 mg daily  Labwork: Please return for labs in 2 weeks (CMET,ProBNP)  Our in office lab hours are Monday-Friday 8:00-4:00, closed for lunch 12:45-1:45 pm.  No appointment needed.  Follow-Up: 2-3 weeks with pharmacist Sherryll Burger titration)  2 months with Dr. Tresa Endo  Any Other Special Instructions Will Be Listed Below (If Applicable).     If you need a refill on your cardiac medications before your next appointment, please call your pharmacy.

## 2018-02-03 ENCOUNTER — Encounter: Payer: Self-pay | Admitting: Neurology

## 2018-02-23 LAB — COMPREHENSIVE METABOLIC PANEL
ALBUMIN: 3.9 g/dL (ref 3.5–5.5)
ALT: 15 IU/L (ref 0–44)
AST: 18 IU/L (ref 0–40)
Albumin/Globulin Ratio: 1.2 (ref 1.2–2.2)
Alkaline Phosphatase: 63 IU/L (ref 39–117)
BILIRUBIN TOTAL: 0.4 mg/dL (ref 0.0–1.2)
BUN / CREAT RATIO: 18 (ref 9–20)
BUN: 14 mg/dL (ref 6–24)
CO2: 24 mmol/L (ref 20–29)
CREATININE: 0.76 mg/dL (ref 0.76–1.27)
Calcium: 9 mg/dL (ref 8.7–10.2)
Chloride: 107 mmol/L — ABNORMAL HIGH (ref 96–106)
GFR calc non Af Amer: 104 mL/min/{1.73_m2} (ref >59)
GFR, EST AFRICAN AMERICAN: 120 mL/min/{1.73_m2} (ref >59)
GLOBULIN, TOTAL: 3.2 g/dL (ref 1.5–4.5)
GLUCOSE: 103 mg/dL — AB (ref 65–99)
Potassium: 4.2 mmol/L (ref 3.5–5.2)
Sodium: 143 mmol/L (ref 134–144)
TOTAL PROTEIN: 7.1 g/dL (ref 6.0–8.5)

## 2018-02-23 LAB — PRO B NATRIURETIC PEPTIDE: NT-Pro BNP: 77 pg/mL (ref 0–121)

## 2018-03-01 ENCOUNTER — Encounter: Payer: Self-pay | Admitting: *Deleted

## 2018-03-08 ENCOUNTER — Encounter: Payer: Self-pay | Admitting: Pharmacist Clinician (PhC)/ Clinical Pharmacy Specialist

## 2018-03-08 ENCOUNTER — Ambulatory Visit (INDEPENDENT_AMBULATORY_CARE_PROVIDER_SITE_OTHER): Payer: Self-pay | Admitting: Pharmacist Clinician (PhC)/ Clinical Pharmacy Specialist

## 2018-03-08 DIAGNOSIS — I5042 Chronic combined systolic (congestive) and diastolic (congestive) heart failure: Secondary | ICD-10-CM

## 2018-03-08 MED ORDER — SACUBITRIL-VALSARTAN 97-103 MG PO TABS: 1.0000 | ORAL_TABLET | Freq: Two times a day (BID) | ORAL | 5 refills | Status: DC

## 2018-03-08 NOTE — Progress Notes (Signed)
03/08/2018 Nicholas Kelly. 03-21-1966 [id number]   HPI:  Nicholas Kelly. is a 52 y.o. male patient of Dr Tresa Endo, with a PMH below who presents today for heart failure medication titration.  In April 2018 he was admitted with acute CHF and found to have an EF of 20-25%, mild pulmonary hypertension and mild non-obstructive CAD in the RCA.  He was started on lisinopril, then transitioned to [facility] last month when he was in to see Dr. Tresa Endo.   In addition to CHF, his medical history is significant for hypertension, hyperlipidemia, tobacco abuse and morbid obesity.  He apparently has some compliance issues as it has been noted that at times when he runs out of medication he does not get refills.  His samples of Entresto ran out yesterday.    Blood Pressure Goal:  130/80  Current Medications:  Entresto 49/51 mg bid  Carvedilol 12.5 mg bid  Furosemide 40 mg qd  Potassium Cl 20 mEq qd   For blood pressure he also takes clonidine 0.1 mg bid and hctz 25 mg qd.  Family Hx:  Mother - died in late 71's from heart diease  Father - alcoholic, cirrhosis in early 60's  1 sister deceased MS  Social Hx:  Former smoker, quit last April; previously 2 packs per week;  Some alcohol on weekends - mixed drinks; drinks soda 2-3 cans per day, trying to drink more water  Diet:  Mostly home cooked, no added salt;  Eats mostly chicken for protein; plenty of vegetables, canned/frozen;  Exercise:  Walks most daily - as form of transportation - takes 10 + minutes to bus stop  Home BP readings:  None  Intolerances:   nkda  Labs:    01-2018:  Na 143, K 4.2, Glu 103, BUN 14, SCr 0.76; NT pro BNP 77  Wt Readings from Last 3 Encounters:  01/31/18 273 lb 6.4 oz (124 kg)  09/20/17 (!) 312 lb 6.4 oz (141.7 kg)  08/18/17 (!) 310 lb (140.6 kg)   BP Readings from Last 3 Encounters:  03/08/18 (!) 146/90  01/31/18 (!) 148/102  09/20/17 (!) 190/112   Pulse Readings from Last 3 Encounters:    03/08/18 (!) 54  01/31/18 74  09/20/17 63    Current Outpatient Medications  Medication Sig Dispense Refill  . aspirin 81 MG tablet Take 1 tablet (81 mg total) by mouth daily. 30 tablet 3  . atorvastatin (LIPITOR) 80 MG tablet Take 1 tablet (80 mg total) by mouth daily at 6 PM. 30 tablet 0  . carvedilol (COREG) 12.5 MG tablet Take 12.5 mg by mouth 2 (two) times daily with a meal.    . cloNIDine (CATAPRES) 0.1 MG tablet Take 1 tablet (0.1 mg total) by mouth 2 (two) times daily. 180 tablet 1  . clopidogrel (PLAVIX) 75 MG tablet Take 1 tablet (75 mg total) by mouth daily. 90 tablet 1  . furosemide (LASIX) 40 MG tablet Take 1 tablet (40 mg total) by mouth daily. 90 tablet 1  . hydrochlorothiazide (HYDRODIURIL) 25 MG tablet Take 0.5 tablets (12.5 mg total) by mouth daily. 90 tablet 1  . Potassium Chloride ER 20 MEQ TBCR Take 1 tablet by mouth daily. 180 tablet 1  . sacubitril-valsartan (ENTRESTO) 97-103 MG Take 1 tablet by mouth 2 (two) times daily. 60 tablet 5   No current facility-administered medications for this visit.     No Known Allergies  Past Medical History:  Diagnosis Date  .  Acute CHF (congestive heart failure) ([facility]) 02/10/2017  . Dyspnea   . Hypertension   . Stroke ([facility])     Blood pressure (!) 146/90, pulse (!) 54.  Chronic combined systolic and diastolic CHF (congestive heart failure) ([facility]) Patient with CHF, now on Entresto 49/51 mg for two weeks without problem.  His blood pressure is slightly elevated today in the office, so will increase the Entresto to 97/103 mg bid.  He was given coupon for 30 day free prescription as well as the paperwork for patient assistance from the manufacturer (he is currently uninsured).  He was encouraged to bring it back in the next week or two so we can determine his ability to get this before his 30 day sample runs out.    He has a return appointment scheduled with Joni Reining in 4 weeks.  Would recommend that he get metabolic panel  done at that time.    Phillips Hay PharmD CPP [facility] [address] [place], [place] [address] [phone]

## 2018-03-08 NOTE — Assessment & Plan Note (Signed)
Patient with CHF, now on Entresto 49/51 mg for two weeks without problem.  His blood pressure is slightly elevated today in the office, so will increase the Entresto to 97/103 mg bid.  He was given coupon for 30 day free prescription as well as the paperwork for patient assistance from the manufacturer (he is currently uninsured).  He was encouraged to bring it back in the next week or two so we can determine his ability to get this before his 30 day sample runs out.    He has a return appointment scheduled with Joni Reining in 4 weeks.  Would recommend that he get metabolic panel done at that time.

## 2018-03-08 NOTE — Patient Instructions (Signed)
Return for a a follow up appointment in 3 weeks  Your blood pressure today is 146/90  Take your BP meds as follows:  Increase Entresto to 97/103 mg twice daily  Continue with all other medications    Sacubitril; Valsartan oral tablet What is this medicine? SACUBITRIL; VALSARTAN (sak UE bi tril; val SAR tan) is a combination of 2 drugs used to reduce the risk of death and hospitalizations in people with long-lasting heart failure. It is usually used with other medicines to treat heart failure. This medicine may be used for other purposes; ask your health care provider or pharmacist if you have questions. COMMON BRAND NAME(S): Entresto What should I tell my health care provider before I take this medicine? They need to know if you have any of these conditions: -diabetes and take a medicine that contains aliskiren -kidney disease -liver disease -an unusual or allergic reaction to sacubitril; valsartan, drugs called angiotensin converting enzyme (ACE) inhibitors, angiotensin II receptor blockers (ARBs), other medicines, foods, dyes, or preservatives -pregnant or trying to get pregnant -breast-feeding How should I use this medicine? Take this medicine by mouth with a glass of water. Follow the directions on the prescription label. You can take it with or without food. If it upsets your stomach, take it with food. Take your medicine at regular intervals. Do not take it more often than directed. Do not stop taking except on your doctor's advice. Do not take this medicine for at least 36 hours before or after you take an ACE inhibitor medicine. Talk to your health care provider if you are not sure if you take an ACE inhibitor. Talk to your pediatrician regarding the use of this medicine in children. Special care may be needed. Overdosage: If you think you have taken too much of this medicine contact a poison control center or emergency room at once. NOTE: This medicine is only for you. Do not  share this medicine with others. What if I miss a dose? If you miss a dose, take it as soon as you can. If it is almost time for next dose, take only that dose. Do not take double or extra doses. What may interact with this medicine? Do not take this medicine with any of the following medicines: -aliskiren if you have diabetes -angiotensin-converting enzyme (ACE) inhibitors, like benazepril, captopril, enalapril, fosinopril, lisinopril, or ramipril This medicine may also interact with the following medicines: -angiotensin II receptor blockers (ARBs) like azilsartan, candesartan, eprosartan, irbesartan, losartan, olmesartan, telmisartan, or valsartan -lithium -NSAIDS, medicines for pain and inflammation, like ibuprofen or naproxen -potassium-sparing diuretics like amiloride, spironolactone, and triamterene -potassium supplements This list may not describe all possible interactions. Give your health care provider a list of all the medicines, herbs, non-prescription drugs, or dietary supplements you use. Also tell them if you smoke, drink alcohol, or use illegal drugs. Some items may interact with your medicine. What should I watch for while using this medicine? Tell your doctor or healthcare professional if your symptoms do not start to get better or if they get worse. Do not become pregnant while taking this medicine. Women should inform their doctor if they wish to become pregnant or think they might be pregnant. There is a potential for serious side effects to an unborn child. Talk to your health care professional or pharmacist for more information. You may get dizzy. Do not drive, use machinery, or do anything that needs mental alertness until you know how this medicine affects you. Do not stand  or sit up quickly, especially if you are an older patient. This reduces the risk of dizzy or fainting spells. Avoid alcoholic drinks; they can make you more dizzy. What side effects may I notice from  receiving this medicine? Side effects that you should report to your doctor or health care professional as soon as possible: -allergic reactions like skin rash, itching or hives, swelling of the face, lips, or tongue -signs and symptoms of increased potassium like muscle weakness; chest pain; or fast, irregular heartbeat -signs and symptoms of kidney injury like trouble passing urine or change in the amount of urine -signs and symptoms of low blood pressure like feeling dizzy or lightheaded, or if you develop extreme fatigue Side effects that usually do not require medical attention (report to your doctor or health care professional if they continue or are bothersome): -cough This list may not describe all possible side effects. Call your doctor for medical advice about side effects. You may report side effects to FDA at 1-800-FDA-1088. Where should I keep my medicine? Keep out of the reach of children. Store at room temperature between 15 and 30 degrees C (59 and 86 degrees F). Throw away any unused medicine after the expiration date. NOTE: This sheet is a summary. It may not cover all possible information. If you have questions about this medicine, talk to your doctor, pharmacist, or health care provider.  2018 Elsevier/Gold Standard (2015-11-27 13:54:19)

## 2018-04-03 NOTE — Progress Notes (Signed)
Cardiology Office Note   Date:  04/04/2018   ID:  Nicholas Fuse., DOB [birth year], MRN [id number]  PCP:  Nicholas Neighbors, FNP  Cardiologist: Dr. Tresa Kelly   Chief Complaint  Patient presents with  . Follow-up  . Dizziness    feels unsteady in the mornings  . Edema    feels swollen     History of Present Illness: Nicholas Osmon. is a 52 y.o. 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  He has a history of right and left cardiac catheterization in April 2018 which revealed no significant CAD with only diffuse 30% to 50% mid and distal RCA stenosis.  He was found to have moderate pulmonary hypertension.  When last seen by Dr. Tresa Kelly on 01/31/2018 he had not been taking lisinopril nor had he been taking Spironolactone.  The patient was concerned the Spironolactone was causing some stomach upset.  The patient was started on Entresto 49/51mg   twice daily, his aspirin was reduced to 81 mg, HCTZ was reduced to 12.5 mg.  Samples were provided for [facility].  The patient is to have follow-up lipids and LFTs ordered on this office visit.  The patient has been seen by pharmacist, Nicholas Kelly, on 03/08/2018.  This was a follow-up evaluation after being started on Entresto 49/51 mg.  The Entresto was increased to 97/103 mg twice daily as his blood pressure was still elevated.  The patient was given drug assistant paperwork.  He is here today with complaints of abdominal distention and edema, along with weight gain.  On review of prior weight and April the patient has gained 45 pounds to date.  He  states he has stopped exercising, he also is not working any longer and is become more sedentary.  He also admits that he has been eating more salt filled foods including potato chips.  He denies racing heart rate, palpitations, he has some dyspnea on exertion.  Past Medical History:  Diagnosis Date  . Acute CHF (congestive heart failure) (HCC) 02/10/2017  . Dyspnea   . Hypertension   . Stroke [facility])     Past Surgical History:  Procedure Laterality Date  . NO PAST SURGERIES    . RIGHT/LEFT HEART CATH AND CORONARY ANGIOGRAPHY N/A 02/12/2017   Procedure: Right/Left Heart Cath and Coronary Angiography;  Surgeon: Nicholas Records, MD;  Location: [facility] INVASIVE CV LAB;  Service: Cardiovascular;  Laterality: N/A;     Current Outpatient Medications  Medication Sig Dispense Refill  . aspirin 81 MG tablet Take 1 tablet (81 mg total) by mouth daily. 30 tablet 3  . atorvastatin (LIPITOR) 80 MG tablet Take 1 tablet (80 mg total) by mouth daily at 6 PM. 30 tablet 0  . carvedilol (COREG) 12.5 MG tablet Take 12.5 mg by mouth 2 (two) times daily with a meal.    . cloNIDine (CATAPRES) 0.1 MG tablet Take 1 tablet (0.1 mg total) by mouth 2 (two) times daily. 180 tablet 1  . clopidogrel (PLAVIX) 75 MG tablet Take 1 tablet (75 mg total) by mouth daily. 90 tablet 1  . furosemide (LASIX) 40 MG tablet Take 1 tablet (40 mg total)  by mouth daily. 90 tablet 1  . hydrochlorothiazide (HYDRODIURIL) 25 MG tablet Take 0.5 tablets (12.5 mg total) by mouth daily. 90 tablet 1  . Potassium Chloride ER 20 MEQ TBCR Take 1 tablet by mouth daily. 180 tablet 1  . sacubitril-valsartan (ENTRESTO) 97-103 MG Take 1 tablet by mouth 2 (two) times daily. 60 tablet 5   No current facility-administered medications for this visit.     Allergies:   Patient has no known allergies.    Social History:  The patient  reports that he quit smoking about 21 months ago. His smoking use included cigarettes. He has a 34.00 pack-year smoking history.  He has never used smokeless tobacco. He reports that he drinks about 4.8 oz of alcohol per week. He reports that he has current or past drug history.   Family History:  The patient's family history includes Cardiomyopathy in his mother; Hypertension in his mother; Stroke in his maternal aunt.    ROS: All other systems are reviewed and negative. Unless otherwise mentioned in H&P    PHYSICAL EXAM: VS:  Ht 5\' 8"  (1.727 m)   Wt (!) 318 lb (144.2 kg)   BMI 48.35 kg/m  , BMI Body mass index is 48.35 kg/m. GEN: Well nourished, well developed, in no acute distress  HEENT: normal  Neck: no JVD, carotid bruits, or masses Cardiac: RRR; distant heart sounds no murmurs, rubs, or gallops,no edema  Respiratory:  clear to auscultation bilaterally, normal work of breathing GI: soft, nontender, distended, + BS MS: no deformity or atrophy  Skin: warm and dry, no rash Neuro:  Strength and sensation are intact Psych: euthymic mood, full affect   EKG: Not completed during office visit  Recent Labs: 07/17/2017: Hemoglobin 15.0; Platelets 241 02/22/2018: ALT 15; BUN 14; Creatinine, Ser 0.76; NT-Pro BNP 77; Potassium 4.2; Sodium 143    Lipid Panel    Component Value Date/Time   CHOL 163 08/05/2017 0558   TRIG 56 08-05-2017 0558   HDL 33 (L) Aug 05, 2017 0558   CHOLHDL 4.9 08-05-2017 0558   VLDL 11 08/05/17 0558   LDLCALC 119 (H) 08/05/2017 0558      Wt Readings from Last 3 Encounters:  04/04/18 (!) 318 lb (144.2 kg)  01/31/18 273 lb 6.4 oz (124 kg)  09/20/17 (!) 312 lb 6.4 oz (141.7 kg)      Other studies Reviewed: Echocardiogram 05-Aug-2017 Left ventricle: The cavity size was mildly dilated. Wall   thickness was increased in a pattern of mild LVH. The estimated   ejection fraction was 30%. Diffuse hypokinesis. Doppler   parameters are consistent with abnormal left ventricular   relaxation (grade 1 diastolic dysfunction). - Aortic valve: There was no stenosis. - Aorta: Mildly dilated  aortic root. Aortic root dimension: 39 mm   (ED). - Mitral valve: There was no significant regurgitation. - Right ventricle: The cavity size was normal. Systolic function   was normal. - Pulmonary arteries: No complete TR doppler jet so unable to   estimate PA systolic pressure. - Inferior vena cava: The vessel was normal in size. The   respirophasic diameter changes were in the normal range (>= 50%),   consistent with normal central venous pressure.  Impressions:  - Mildly dilated LV with mild LV hypertrophy. EF 30% with diffuse   hypokinesis. Normal RV size and systolic function. No significant   valvular abnormalities.  Cardiac Cath 02/12/2017    Mid RCA to Dist RCA lesion, 50 %stenosed.  The left ventricular ejection fraction  is less than 25% by visual estimate.  Hemodynamic findings consistent with moderate pulmonary hypertension.  LV end diastolic pressure is severely elevated.    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  Widely patent coronary arteries with diffuse 30-50% mid and distal RCA stenoses.  Moderate pulmonary hypertension  RECOMMENDATIONS:  Guideline mandated heart failure therapy    ASSESSMENT AND PLAN:  1.  Nonischemic cardiomyopathy: Most recent ejection fraction 30% per echo in September 2018.  The patient has had symptoms of worsening fluid retention in the abdomen.  He is also had some weight gain.  He is medically compliant with Entresto and furosemide.  Patient is on carvedilol 12.5 mg twice daily.  Blood pressure is low normal.  2.  Decompensated mixed CHF: The patient is not complaining of significant dyspnea on exertion, but does have some dyspnea uncertain if it is related to his weight gain or related to fluid or combination of both.  The patient has gained 45 pounds since April.  He has become more sedentary and is not adhered to a low-sodium diet.    I will increase  his Lasix to 40 mg twice daily for 2 days.  He will take an extra dose of potassium with each of those doses of Lasix.  On the third day he will increase his Lasix to 60 mg daily.  I will check a BMET today for baseline kidney function.  We will need to repeat a BMET we see him again on next office visit.  He is to weigh himself daily and avoid salty foods a written instruction pamphlet is given to him concerning low-sodium diet.  He is to avoid potato chips.  I will check a BMP to evaluate fluid status as well.  He probably will need a repeat echocardiogram to evaluate further and help with medication titration and management.  This can be ordered on follow-up visit once I see how well he responds to increased dose of Lasix.  May need to change the Lasix to torsemide for better bioavailability if he does not respond well to Lasix.  I will not restart him on Spironolactone at this time as he has had issues with nausea associated with it.  3.  History of hypertension.  Blood pressure is low normal now on high-dose Entresto along with carvedilol and clonidine.  He continues on HCTZ 12.5 mg daily as well.  4.  History of CVA: The patient states he has chronic dizziness associated with this which is not new for him on higher doses of Entresto and diuretics.  Dizziness is unchanged.  5.  Morbid obesity: Weight loss with calorie reduction increase exercise is recommended.  The patient states he feels dizzy when he gets out and walks.  Usually associated with the heat.  He may need to find an indoor exercise regimen to allow him to be more active and better temperature controlled environment.  Current medicines are reviewed at length with the patient today.    Labs/ tests ordered today include:BMET, BNP   Nicholas Kelly, ANP, AACC   04/04/2018 8:40 AM    [facility] [address] [place], [place] [address] Phone: [phone]; Fax: [phone] no after

## 2018-04-04 ENCOUNTER — Encounter: Payer: Self-pay | Admitting: Adult Health

## 2018-04-04 ENCOUNTER — Ambulatory Visit (INDEPENDENT_AMBULATORY_CARE_PROVIDER_SITE_OTHER): Payer: Self-pay | Admitting: Adult Health

## 2018-04-04 VITALS — BP 107/69 | HR 53 | Ht 68.0 in | Wt 318.0 lb

## 2018-04-04 DIAGNOSIS — Z79899 Other long term (current) drug therapy: Secondary | ICD-10-CM

## 2018-04-04 DIAGNOSIS — I5042 Chronic combined systolic (congestive) and diastolic (congestive) heart failure: Secondary | ICD-10-CM

## 2018-04-04 DIAGNOSIS — H539 Unspecified visual disturbance: Secondary | ICD-10-CM

## 2018-04-04 DIAGNOSIS — I10 Essential (primary) hypertension: Secondary | ICD-10-CM

## 2018-04-04 DIAGNOSIS — I69398 Other sequelae of cerebral infarction: Secondary | ICD-10-CM

## 2018-04-04 MED ORDER — FUROSEMIDE 40 MG PO TABS: 60.0000 mg | ORAL_TABLET | Freq: Every day | ORAL | 1 refills | Status: DC

## 2018-04-04 NOTE — Patient Instructions (Signed)
Medication Instructions:  INCREASE LASIX TO 80 MG TWICE DAILY FOR 2 DAYS THEN INCREASE DAILY DOSE TO 60MG  DAILY  TAKE EXTRA POTASSIUM FOR 2 DAYS THEN BACK TO 20MG  DAILY(1TAB)  If you need a refill on your cardiac medications before your next appointment, please call your pharmacy.  Labwork: BNP,BMET AND MAG TODEAY HERE IN OUR OFFICE AT [facility]  Take the provided lab slips with you to the lab for your blood draw.   Special Instructions: WEIGH DAILY AND LOG  PLEASE FOLLOW LOW SODIUM DIET  Follow-Up: Your physician wants you to follow-up in: 2 WEEKS WITH KATHRYN LAWRENCE (NURSE PRACTIONIER), DNP,AACC IF PRIMARY CARDIOLOGIST IS UNAVAILABLE.   Thank you for choosing [facility] at [employer] With Less Salt Cooking with less salt is one way to reduce the amount of sodium you get from food. Depending on your condition and overall health, your health care provider or diet and nutrition specialist (dietitian) may recommend that you reduce your sodium intake. Most people should have less than 2,300 milligrams (mg) of sodium each day. If you have high blood pressure (hypertension), you may need to limit your sodium to 1,500 mg each day. Follow the tips below to help reduce your sodium intake. What do I need to know about cooking with less salt? Shopping  Buy sodium-free or low-sodium products. Look for the following words on food labels: ? Low-sodium. ? Sodium-free. ? Reduced-sodium. ? No salt added. ? Unsalted.  Buy fresh or frozen vegetables. Avoid canned vegetables.  Avoid buying meats or protein foods that have been injected with broth or saline solution.  Avoid cured or smoked meats, such as hot dogs, bacon, salami, ham, and bologna. Reading food labels  Check the food label before buying or using packaged ingredients.  Look for products with no more than 140 mg of sodium in one serving.  Do not choose foods with salt as one of the first three ingredients on  the ingredients list. If salt is one of the first three ingredients, it usually means the item is high in sodium, because ingredients are listed in order of amount in the food item. Cooking  Use herbs, seasonings without salt, and spices as substitutes for salt in foods.  Use sodium-free baking soda when baking.  Grill, braise, or roast foods to add flavor with less salt.  Avoid adding salt to pasta, rice, or hot cereals while cooking.  Drain and rinse canned vegetables before use.  Avoid adding salt when cooking sweets and desserts.  Cook with low-sodium ingredients. What are some salt alternatives? The following are herbs, seasonings, and spices that can be used instead of salt to give taste to your food. Herbs should be fresh or dried. Do not choose packaged mixes. Next to the name of the herb, spice, or seasoning are some examples of foods you can pair it with. Herbs  Bay leaves - Soups, meat and vegetable dishes, and spaghetti sauce.  Basil - [employer], soups, pasta, and fish dishes.  Cilantro - Meat, poultry, and vegetable dishes.  Chili powder - Marinades and Mexican dishes.  Chives - Salad dressings and potato dishes.  Cumin - Mexican dishes, couscous, and meat dishes.  Dill - Fish dishes, sauces, and salads.  Fennel - Meat and vegetable dishes, breads, and cookies.  Garlic (do not use garlic salt) - [place] dishes, meat dishes, salad dressings, and sauces.  Marjoram - Soups, potato dishes, and meat dishes.  Staci Acosta and  spaghetti sauce.  Parsley - Salads, soups, pasta, and meat dishes.  Rosemary - [place] dishes, salad dressings, soups, and red meats.  Saffron - Fish dishes, pasta, and some poultry dishes.  Sage - Stuffings and sauces.  Tarragon - Fish and Whole Foods.  Thyme - Stuffing, meat, and fish dishes. Seasonings  Lemon juice - Fish dishes, poultry dishes, vegetables, and salads.  Vinegar - Salad dressings, vegetables, and fish  dishes. Spices  Cinnamon - Sweet dishes, such as cakes, cookies, and puddings.  Cloves - Gingerbread, puddings, and marinades for meats.  Curry - Vegetable dishes, fish and poultry dishes, and stir-fry dishes.  Ginger - Vegetables dishes, fish dishes, and stir-fry dishes.  Nutmeg - Pasta, vegetables, poultry, fish dishes, and custard. What are some low-sodium ingredients and foods?  Fresh or frozen fruits and vegetables with no sauce added.  Fresh or frozen whole meats, poultry, and fish with no sauce added.  Eggs.  Noodles, pasta, quinoa, rice.  Shredded or puffed wheat or puffed rice.  Regular or quick oats.  Milk, yogurt, hard cheeses, and low-sodium cheeses. Good cheese choices include Swiss, [employer], and [address]. Always check the label for the serving size and sodium content.  Unsalted butter or margarine.  Unsalted nuts.  Sherbet or ice cream (keep to  cup per serving).  Homemade pudding.  Sodium-free baking soda and baking powder. This is not a complete list of low-sodium ingredients and foods. Contact your dietitian for more options. Summary  Cooking with less salt is one way to reduce the amount of sodium that you get from food.  Buy sodium-free or low-sodium products.  Check the food label before using or buying packaged ingredients.  Use herbs, seasonings without salt, and spices as substitutes for salt in foods. This information is not intended to replace advice given to you by your health care provider. Make sure you discuss any questions you have with your health care provider. Document Released: 10/12/2005 Document Revised: 10/20/2016 Document Reviewed: 10/20/2016 Elsevier Interactive Patient Education  2017 Elsevier Inc.   DASH Eating Plan DASH stands for "Dietary Approaches to Stop Hypertension." The DASH eating plan is a healthy eating plan that has been shown to reduce high blood pressure (hypertension). It may also reduce your risk  for type 2 diabetes, heart disease, and stroke. The DASH eating plan may also help with weight loss. What are tips for following this plan? General guidelines  Avoid eating more than 2,300 mg (milligrams) of salt (sodium) a day. If you have hypertension, you may need to reduce your sodium intake to 1,500 mg a day.  Limit alcohol intake to no more than 1 drink a day for nonpregnant women and 2 drinks a day for men. One drink equals 12 oz of beer, 5 oz of wine, or 1 oz of hard liquor.  Work with your health care provider to maintain a healthy body weight or to lose weight. Ask what an ideal weight is for you.  Get at least 30 minutes of exercise that causes your heart to beat faster (aerobic exercise) most days of the week. Activities may include walking, swimming, or biking.  Work with your health care provider or diet and nutrition specialist (dietitian) to adjust your eating plan to your individual calorie needs. Reading food labels  Check food labels for the amount of sodium per serving. Choose foods with less than 5 percent of the Daily Value of sodium. Generally, foods with less than 300 mg of sodium per serving  fit into this eating plan.  To find whole grains, look for the word "whole" as the first word in the ingredient list. Shopping  Buy products labeled as "low-sodium" or "no salt added."  Buy fresh foods. Avoid canned foods and premade or frozen meals. Cooking  Avoid adding salt when cooking. Use salt-free seasonings or herbs instead of table salt or sea salt. Check with your health care provider or pharmacist before using salt substitutes.  Do not fry foods. Cook foods using healthy methods such as baking, boiling, grilling, and broiling instead.  Cook with heart-healthy oils, such as olive, canola, soybean, or sunflower oil. Meal planning   Eat a balanced diet that includes: ? 5 or more servings of fruits and vegetables each day. At each meal, try to fill half of your  plate with fruits and vegetables. ? Up to 6-8 servings of whole grains each day. ? Less than 6 oz of lean meat, poultry, or fish each day. A 3-oz serving of meat is about the same size as a deck of cards. One egg equals 1 oz. ? 2 servings of low-fat dairy each day. ? A serving of nuts, seeds, or beans 5 times each week. ? Heart-healthy fats. Healthy fats called Omega-3 fatty acids are found in foods such as flaxseeds and coldwater fish, like sardines, salmon, and mackerel.  Limit how much you eat of the following: ? Canned or prepackaged foods. ? Food that is high in trans fat, such as fried foods. ? Food that is high in saturated fat, such as fatty meat. ? Sweets, desserts, sugary drinks, and other foods with added sugar. ? Full-fat dairy products.  Do not salt foods before eating.  Try to eat at least 2 vegetarian meals each week.  Eat more home-cooked food and less restaurant, buffet, and fast food.  When eating at a restaurant, ask that your food be prepared with less salt or no salt, if possible. What foods are recommended? The items listed may not be a complete list. Talk with your dietitian about what dietary choices are best for you. Grains Whole-grain or whole-wheat bread. Whole-grain or whole-wheat pasta. Brown rice. Orpah Cobb. Bulgur. Whole-grain and low-sodium cereals. Pita bread. Low-fat, low-sodium crackers. Whole-wheat flour tortillas. Vegetables Fresh or frozen vegetables (raw, steamed, roasted, or grilled). Low-sodium or reduced-sodium tomato and vegetable juice. Low-sodium or reduced-sodium tomato sauce and tomato paste. Low-sodium or reduced-sodium canned vegetables. Fruits All fresh, dried, or frozen fruit. Canned fruit in natural juice (without added sugar). Meat and other protein foods Skinless chicken or [place]. Ground chicken or [place]. Pork with fat trimmed off. Fish and seafood. Egg whites. Dried beans, peas, or lentils. Unsalted nuts, nut butters, and  seeds. Unsalted canned beans. Lean cuts of beef with fat trimmed off. Low-sodium, lean deli meat. Dairy Low-fat (1%) or fat-free (skim) milk. Fat-free, low-fat, or reduced-fat cheeses. Nonfat, low-sodium ricotta or cottage cheese. Low-fat or nonfat yogurt. Low-fat, low-sodium cheese. Fats and oils Soft margarine without trans fats. Vegetable oil. Low-fat, reduced-fat, or light mayonnaise and salad dressings (reduced-sodium). Canola, safflower, olive, soybean, and sunflower oils. Avocado. Seasoning and other foods Herbs. Spices. Seasoning mixes without salt. Unsalted popcorn and pretzels. Fat-free sweets. What foods are not recommended? The items listed may not be a complete list. Talk with your dietitian about what dietary choices are best for you. Grains Baked goods made with fat, such as croissants, muffins, or some breads. Dry pasta or rice meal packs. Vegetables Creamed or fried vegetables. Vegetables in  a cheese sauce. Regular canned vegetables (not low-sodium or reduced-sodium). Regular canned tomato sauce and paste (not low-sodium or reduced-sodium). Regular tomato and vegetable juice (not low-sodium or reduced-sodium). Rosita Fire. Olives. Fruits Canned fruit in a light or heavy syrup. Fried fruit. Fruit in cream or butter sauce. Meat and other protein foods Fatty cuts of meat. Ribs. Fried meat. Tomasa Blase. Sausage. Bologna and other processed lunch meats. Salami. Fatback. Hotdogs. Bratwurst. Salted nuts and seeds. Canned beans with added salt. Canned or smoked fish. Whole eggs or egg yolks. Chicken or [place] with skin. Dairy Whole or 2% milk, cream, and half-and-half. Whole or full-fat cream cheese. Whole-fat or sweetened yogurt. Full-fat cheese. Nondairy creamers. Whipped toppings. Processed cheese and cheese spreads. Fats and oils Butter. Stick margarine. Lard. Shortening. Ghee. Bacon fat. Tropical oils, such as coconut, palm kernel, or palm oil. Seasoning and other foods Salted popcorn and  pretzels. Onion salt, garlic salt, seasoned salt, table salt, and sea salt. Worcestershire sauce. Tartar sauce. Barbecue sauce. Teriyaki sauce. Soy sauce, including reduced-sodium. Steak sauce. Canned and packaged gravies. Fish sauce. Oyster sauce. Cocktail sauce. Horseradish that you find on the shelf. Ketchup. Mustard. Meat flavorings and tenderizers. Bouillon cubes. Hot sauce and Tabasco sauce. Premade or packaged marinades. Premade or packaged taco seasonings. Relishes. Regular salad dressings. Where to find more information:  National Heart, Lung, and Blood Institute: PopSteam.is  American Heart Association: www.heart.org Summary  The DASH eating plan is a healthy eating plan that has been shown to reduce high blood pressure (hypertension). It may also reduce your risk for type 2 diabetes, heart disease, and stroke.  With the DASH eating plan, you should limit salt (sodium) intake to 2,300 mg a day. If you have hypertension, you may need to reduce your sodium intake to 1,500 mg a day.  When on the DASH eating plan, aim to eat more fresh fruits and vegetables, whole grains, lean proteins, low-fat dairy, and heart-healthy fats.  Work with your health care provider or diet and nutrition specialist (dietitian) to adjust your eating plan to your individual calorie needs. This information is not intended to replace advice given to you by your health care provider. Make sure you discuss any questions you have with your health care provider. Document Released: 10/01/2011 Document Revised: 10/05/2016 Document Reviewed: 10/05/2016 Elsevier Interactive Patient Education  [employer].

## 2018-04-05 ENCOUNTER — Other Ambulatory Visit: Payer: Self-pay

## 2018-04-05 DIAGNOSIS — Z79899 Other long term (current) drug therapy: Secondary | ICD-10-CM

## 2018-04-05 LAB — BASIC METABOLIC PANEL
BUN / CREAT RATIO: 27 — AB (ref 9–20)
BUN: 29 mg/dL — AB (ref 6–24)
CHLORIDE: 106 mmol/L (ref 96–106)
CO2: 20 mmol/L (ref 20–29)
Calcium: 8.8 mg/dL (ref 8.7–10.2)
Creatinine, Ser: 1.08 mg/dL (ref 0.76–1.27)
GFR calc non Af Amer: 78 mL/min/{1.73_m2} (ref >59)
GFR, EST AFRICAN AMERICAN: 90 mL/min/{1.73_m2} (ref >59)
GLUCOSE: 110 mg/dL — AB (ref 65–99)
POTASSIUM: 4.5 mmol/L (ref 3.5–5.2)
Sodium: 138 mmol/L (ref 134–144)

## 2018-04-05 LAB — BRAIN NATRIURETIC PEPTIDE: BNP: 36.8 pg/mL (ref 0.0–100.0)

## 2018-04-05 LAB — MAGNESIUM: Magnesium: 2.2 mg/dL (ref 1.6–2.3)

## 2018-04-14 ENCOUNTER — Other Ambulatory Visit: Payer: Self-pay | Admitting: *Deleted

## 2018-04-14 DIAGNOSIS — Z79899 Other long term (current) drug therapy: Secondary | ICD-10-CM

## 2018-04-14 LAB — BASIC METABOLIC PANEL
BUN/Creatinine Ratio: 20 (ref 9–20)
BUN: 18 mg/dL (ref 6–24)
CALCIUM: 9.2 mg/dL (ref 8.7–10.2)
CHLORIDE: 104 mmol/L (ref 96–106)
CO2: 25 mmol/L (ref 20–29)
Creatinine, Ser: 0.88 mg/dL (ref 0.76–1.27)
GFR, EST AFRICAN AMERICAN: 113 mL/min/{1.73_m2} (ref >59)
GFR, EST NON AFRICAN AMERICAN: 98 mL/min/{1.73_m2} (ref >59)
Glucose: 87 mg/dL (ref 65–99)
POTASSIUM: 4.3 mmol/L (ref 3.5–5.2)
SODIUM: 141 mmol/L (ref 134–144)

## 2018-04-17 NOTE — Progress Notes (Signed)
Cardiology Office Note   Date:  04/18/2018   ID:  Nicholas Fuse., DOB September 21, 1966, MRN [id number]  PCP:  Bing Neighbors, FNP  Cardiologist: Dr. Tresa Endo   Chief Complaint  Patient presents with  . Follow-up     History of Present Illness: Nicholas Mastro. is a 52 y.o. male who presents for hypertension, nonischemic cardiomyopathy, diastolic CHF with recent admission October 2018 for decompensated CHF and pulmonary edema.  Echocardiogram revealed LV function of 20% to 25% with moderate concentric LVH, diffuse hypokinesis, and grade 3 diastolic dysfunction.  Was also noted to have mild mitral regurgitation, moderate tricuspid regurgitation and moderate pulmonary hypertension with a PA pressure of 51 mmHg  The patient did have cardiac catheterization both right and left, with suggested systolic heart failure due to dilated cardiomyopathy, EF less than 25% and elevated filling pressures.  He was not found to have significant CAD with only diffuse 30% to 50% mid and distal RCA stenosis.  He was placed on lisinopril 5 mg daily, furosemide 40 mg daily, carvedilol 6.25 mg twice daily in addition to aspirin 81 mg, along with atorvastatin 40 mg daily.  He was last seen by Dr. Tresa Endo on 01/31/2018 and had run out of his lisinopril for 2 months, he continued to take Plavix 75 mg daily, carvedilol 12.5 mg twice daily, furosemide 40 mg daily and HCTZ.  He had been on Spironolactone but stopped taking that as well.  When seen last by Dr. Tresa Endo the patient had lost 35 pounds purposefully with improved diet and exercise.  He was given samples of Entresto 49/51 mg twice daily and was to follow-up with pharmacy with repeat labs.  His aspirin was reduced to 81 mg from 325 mg and his HCTZ was reduced to 12.5 mg.  The patient was last seen in our office on 04/04/2018 at which time he was complaining of lower extremity edema, had gained 45 pounds back, had stopped exercising, had stopped working and became  more sedentary.  I increased his Lasix to 40 mg twice daily for 2 days, and he was to take an extra dose of potassium . with each of those doses of Lasix.  On the third day he was to increase his Lasix to 60 mg daily.  A follow-up BMET was ordered, he was given written instructions on a low-sodium diet.  There would be consideration to repeat his echocardiogram.  Also consideration to change Lasix to torsemide for better bioavailability.  Labs dated 04/14/2018: Sodium 141, potassium 4.3, chloride 104, CO2 25, glucose 87, BUN 18, creatinine 0.88.   Although he has not lost any weight per our scale, he is feeling better and is breathing better. He requires assistance with his Entresto prescription.   Past Medical History:  Diagnosis Date  . Acute CHF (congestive heart failure) (HCC) 02/10/2017  . Dyspnea   . Hypertension   . Stroke [facility])     Past Surgical History:  Procedure Laterality Date  . NO PAST SURGERIES    . RIGHT/LEFT HEART CATH AND CORONARY ANGIOGRAPHY N/A 02/12/2017   Procedure: Right/Left Heart Cath and Coronary Angiography;  Surgeon: Lyn Records, MD;  Location: [facility] INVASIVE CV LAB;  Service: Cardiovascular;  Laterality: N/A;     Current Outpatient Medications  Medication Sig Dispense Refill  . aspirin 81 MG tablet Take 1 tablet (81 mg total) by mouth daily. 30 tablet 3  . atorvastatin (LIPITOR) 80 MG tablet Take 1 tablet (80 mg total) by mouth  daily at 6 PM. 30 tablet 0  . carvedilol (COREG) 12.5 MG tablet Take 12.5 mg by mouth 2 (two) times daily with a meal.    . cloNIDine (CATAPRES) 0.1 MG tablet Take 1 tablet (0.1 mg total) by mouth 2 (two) times daily. 180 tablet 1  . clopidogrel (PLAVIX) 75 MG tablet Take 1 tablet (75 mg total) by mouth daily. 90 tablet 1  . furosemide (LASIX) 40 MG tablet Take 1.5 tablets (60 mg total) by mouth daily. 137 tablet 1  . hydrochlorothiazide (HYDRODIURIL) 25 MG tablet Take 0.5 tablets (12.5 mg total) by mouth daily. 90 tablet 1  . Potassium  Chloride ER 20 MEQ TBCR Take 1 tablet by mouth daily. 180 tablet 1  . sacubitril-valsartan (ENTRESTO) 97-103 MG Take 1 tablet by mouth 2 (two) times daily. 60 tablet 5   No current facility-administered medications for this visit.     Allergies:   Patient has no known allergies.    Social History:  The patient  reports that he quit smoking about 21 months ago. His smoking use included cigarettes. He has a 34.00 pack-year smoking history. He has never used smokeless tobacco. He reports that he drinks about 4.8 oz of alcohol per week. He reports that he has current or past drug history.   Family History:  The patient's family history includes Cardiomyopathy in his mother; Hypertension in his mother; Stroke in his maternal aunt.    ROS: All other systems are reviewed and negative. Unless otherwise mentioned in H&P    PHYSICAL EXAM: VS:  BP 121/78   Pulse 66   Ht 5\' 8"  (1.727 m)   Wt (!) 317 lb 6.4 oz (144 kg)   BMI 48.26 kg/m  , BMI Body mass index is 48.26 kg/m. GEN: Well nourished, well developed, in no acute distress  HEENT: normal  Neck: no JVD, carotid bruits, or masses Cardiac: RRR; no murmurs, rubs, or gallops,no edema  Respiratory:  clear to auscultation bilaterally, normal work of breathing GI: soft, nontender, nondistended, + BS MS: no deformity or atrophy  Skin: warm and dry, no rash Neuro:  Strength and sensation are intact Psych: euthymic mood, full affect   EKG:  None completed this office visit.   Recent Labs: 07/17/2017: Hemoglobin 15.0; Platelets 241 02/22/2018: ALT 15; NT-Pro BNP 77 04/04/2018: BNP 36.8; Magnesium 2.2 04/14/2018: BUN 18; Creatinine, Ser 0.88; Potassium 4.3; Sodium 141    Lipid Panel    Component Value Date/Time   CHOL 163 Aug 04, 2017 0558   TRIG 56 Aug 04, 2017 0558   HDL 33 (L) Aug 04, 2017 0558   CHOLHDL 4.9 2017-08-04 0558   VLDL 11 2017/08/04 0558   LDLCALC 119 (H) Aug 04, 2017 0558      Wt Readings from Last 3 Encounters:  04/18/18  (!) 317 lb 6.4 oz (144 kg)  04/04/18 (!) 318 lb (144.2 kg)  01/31/18 273 lb 6.4 oz (124 kg)      Other studies Reviewed: Echocardiogram 08/04/17 Left ventricle: The cavity size was mildly dilated. Wall thickness was increased in a pattern of mild LVH. The estimated ejection fraction was 30%. Diffuse hypokinesis. Doppler parameters are consistent with abnormal left ventricular relaxation (grade 1 diastolic dysfunction). - Aortic valve: There was no stenosis. - Aorta: Mildly dilated aortic root. Aortic root dimension: 39 mm (ED). - Mitral valve: There was no significant regurgitation. - Right ventricle: The cavity size was normal. Systolic function was normal. - Pulmonary arteries: No complete TR doppler jet so unable to estimate PA systolic  pressure. - Inferior vena cava: The vessel was normal in size. The respirophasic diameter changes were in the normal range (>= 50%), consistent with normal central venous pressure.  Impressions:  - Mildly dilated LV with mild LV hypertrophy. EF 30% with diffuse hypokinesis. Normal RV size and systolic function. No significant valvular abnormalities.  Cardiac Cath 02/12/2017    Mid RCA to Dist RCA lesion, 50 %stenosed.  The left ventricular ejection fraction is less than 25% by visual estimate.  Hemodynamic findings consistent with moderate pulmonary hypertension.  LV end diastolic pressure is severely elevated.   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  Widely patent coronary arteries with diffuse 30-50% mid and distal RCA stenoses.  Moderate pulmonary hypertension  RECOMMENDATIONS:  Guideline mandated heart failure therapy      ASSESSMENT AND PLAN:  1.  Nonischemic Cardiomyopathy:  He is tolerating higher doses of lasix 60 mg daily now. He is not having any further complaints of dyspnea. He is given co-pay cards for [facility]  with drug assistant forms from [facility] yet to be filled out. LVEF 20%-25%.   2. Chronic Decompensated CHF: No evidence of volume overload. He will continue low sodium diet and take lasix 60 mg as directed. No changes in regimen at this time.   3. Hypertension: Elevated today and not optimal for current EF 20-25%. He will continue regimen but will need to be stricter on salt intake. If necessary will increase current dosing. Consider referral to [facility].   4. Obesity: Increased exercise and reduced calorie diet is recommended.    Current medicines are reviewed at length with the patient today.    Labs/ tests ordered today include: None   Nicholas Kelly, ANP, AACC   04/18/2018 12:55 PM    [facility] [address], [place], [place] [address] Phone: [phone]; Fax: [phone]

## 2018-04-18 ENCOUNTER — Ambulatory Visit (INDEPENDENT_AMBULATORY_CARE_PROVIDER_SITE_OTHER): Payer: Self-pay | Admitting: Adult Health

## 2018-04-18 ENCOUNTER — Encounter: Payer: Self-pay | Admitting: Adult Health

## 2018-04-18 VITALS — BP 121/78 | HR 66 | Ht 68.0 in | Wt 317.4 lb

## 2018-04-18 DIAGNOSIS — I5042 Chronic combined systolic (congestive) and diastolic (congestive) heart failure: Secondary | ICD-10-CM

## 2018-04-18 DIAGNOSIS — I519 Heart disease, unspecified: Secondary | ICD-10-CM

## 2018-04-18 DIAGNOSIS — I428 Other cardiomyopathies: Secondary | ICD-10-CM

## 2018-04-18 DIAGNOSIS — I2510 Atherosclerotic heart disease of native coronary artery without angina pectoris: Secondary | ICD-10-CM

## 2018-04-18 NOTE — Patient Instructions (Signed)
Medication Instructions:  NO CHANGES- Your physician recommends that you continue on your current medications as directed. Please refer to the Current Medication list given to you today.  If you need a refill on your cardiac medications before your next appointment, please call your pharmacy.  Testing/Procedures: Echocardiogram - (schedule in September)Your physician has requested that you have an echocardiogram. Echocardiography is a painless test that uses sound waves to create images of your heart. It provides your doctor with information about the size and shape of your heart and how well your heart's chambers and valves are working. This procedure takes approximately one hour. There are no restrictions for this procedure. This will be performed at our [facility] location - [address], [address].  Follow-Up: Your physician wants you to follow-up in: AFTER ECHO WITH DR Bethann Humble   Thank you for choosing [facility] at [facility]!!

## 2018-06-08 DIAGNOSIS — Z0271 Encounter for disability determination: Secondary | ICD-10-CM

## 2018-07-19 ENCOUNTER — Other Ambulatory Visit (HOSPITAL_COMMUNITY): Payer: Self-pay

## 2018-07-26 ENCOUNTER — Encounter: Payer: Self-pay | Admitting: Cardiovascular Disease

## 2018-07-26 ENCOUNTER — Ambulatory Visit (INDEPENDENT_AMBULATORY_CARE_PROVIDER_SITE_OTHER): Payer: Self-pay | Admitting: Cardiovascular Disease

## 2018-07-26 VITALS — BP 122/70 | HR 53 | Ht 68.0 in | Wt 313.0 lb

## 2018-07-26 DIAGNOSIS — Z79899 Other long term (current) drug therapy: Secondary | ICD-10-CM

## 2018-07-26 DIAGNOSIS — I428 Other cardiomyopathies: Secondary | ICD-10-CM

## 2018-07-26 DIAGNOSIS — I10 Essential (primary) hypertension: Secondary | ICD-10-CM

## 2018-07-26 DIAGNOSIS — I5042 Chronic combined systolic (congestive) and diastolic (congestive) heart failure: Secondary | ICD-10-CM

## 2018-07-26 DIAGNOSIS — E785 Hyperlipidemia, unspecified: Secondary | ICD-10-CM

## 2018-07-26 DIAGNOSIS — I2510 Atherosclerotic heart disease of native coronary artery without angina pectoris: Secondary | ICD-10-CM

## 2018-07-26 MED ORDER — SACUBITRIL-VALSARTAN 49-51 MG PO TABS: 1.0000 | ORAL_TABLET | Freq: Two times a day (BID) | ORAL | 0 refills | Status: DC

## 2018-07-26 NOTE — Patient Instructions (Addendum)
Medication Instructions:  START Entresto 49/51 mg two times daily  Labwork: Please return for FASTING labs prior to appt in 2 months (CMET, CBC, Lipid, TSH)  Our in office lab hours are Monday-Friday 8:00-4:00, closed for lunch 12:45-1:45 pm.  No appointment needed.  Follow-Up: 2 months with Dr. Tresa Endo  Any Other Special Instructions Will Be Listed Below (If Applicable).  Call Entresto to inquire about patient assistance [phone] Once you do this-call Kristin/Raquel (pharmacist) to make aware [phone]   If you need a refill on your cardiac medications before your next appointment, please call your pharmacy.

## 2018-07-26 NOTE — Progress Notes (Signed)
Cardiology Office Note    Date:  07/28/2018   ID:  [name], DOB [birth year], MRN [id number]  PCP:  [name], FNP  Cardiologist:  [name], MD   No chief complaint on file.   History of Present Illness:  Nicholas Whitworth. is a 52 y.o. male who presents for a 6 month follow-up cardiology evaluation.   Nicholas Kelly has a history of hypertension, and presented to [facility] hospital on 02/10/2017 from urgent care with acute onset of shortness of breath.  He was previously diagnosed with CHF in October 17 and had been on HCTZ but had run out of this prescription.  He was admitted by the hospitalist service.  A chest x-ray showed findings of CHF with pulmonary edema.  BNP was elevated at 752.  An echo Doppler study showed reduced LV function with an EF of 2025% with moderate concentric LVH, diffuse hypokinesis, grade 3 diastolic dysfunction, mild mitral regurgitation, moderate tricuspid regurgitation, and moderate bony hypertension with a PA pressure 51 mm.  Patient was seen in cardiology consultation.  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  He did not have significant obstructive CAD with only diffuse 30-50% mid and distal RCA stenoses.  There is moderate pulmonary hypertension.  He was discharged on 02/14/2017 and has been on lisinopril 5 mg, furosemide 40 mg, carvedilol 6.25 mg twice a day in addition to aspirin 81 mg, supplemental potassium 20, now quit once and atorvastatin 40 mg.     I saw for initial post hospital evaluation on 03/11/2017.  I reviewed his hospital data in its entirety.  He was feeling improved on his initial medical regimen and I recommended further titration of his lisinopril up to 10 mg and increase carvedilol to 12.5 mg twice a day.  He did not have insurance and ws working on trying to obtain a  Medicaid card.  We talked in the future about possibly switching him to entresto.  On his increase regimen, he has felt improved.    When I saw him in June 2018 his blood pressure continued to be elevated and I recommended the addition of spironolactone at 12.5 mg for one week and then to increase this to 12.5 mg twice a day.  I also titrated lisinopril from 10 mg to 15 mg. On July 17, 2017 he presented to the hospital with complaints of diplopia and visual disturbance. 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  An MRI of the range, did not show acute abnormality but revealed chronic ischemic microangiopathy.  He had a negative CTA, except for minimal calcified atherosclerosis involving only the carotids.  During his hospitalization.  A repeat echo Doppler study showed an EF of 30% with grade 1 diastolic dysfunction.  His aortic root was mildly dilated.  Neurology was consulted and he was felt to have had a likely CVA given his reduced LV function.  He underwent outpatient neurologic rehab.  When I saw him in October 2018 bloodpressure was still elevated and I further titrated lisinopril to 20 mg.  He was also taking Spironolactone 12.5 mg twice a day, furosemide 40 mg daily, carvedilol 12.5 twice daily in addition to atorvastatin 80 mg and Plavix and aspirin.  When I last saw him in April 2019 he  had run out of his lisinopril for the last 2 months.  His allergist had increase aspirin to 325 and he had continued to be on Plavix, carvedilol 12.5 twice a day, furosemide 40 mg and HCTZ 25 mg.  He had lost 35 pounds since October 2018.  He was no longer taking Spironolactone.  With his reduced EF I felt he was a good candidate for initiation of Entresto.  Over the past several months he is gradually been titrated up to maximum dose 97/103 and last saw Nicholas Kelly off [place] in May at which time the dose was titrated to maximum.   Apparently he took that dose for a month since he had a 30-day free samples but unfortunately when [facility] the [facility] they were unable to fill it since he had no insurance.  As result of the past several months he has not been on Entresto and also is no longer taking his previous ACE inhibitor.  He denies chest pain.  He denies palpitations.  He denies significant change in shortness of breath.  He presents for evaluation.   Past Medical History:  Diagnosis Date  . Acute CHF (congestive heart failure) ([place]) 02/10/2017  . Dyspnea   . Hypertension   . Stroke [facility])     Past Surgical History:  Procedure Laterality Date  . NO PAST SURGERIES    . RIGHT/LEFT HEART CATH AND CORONARY ANGIOGRAPHY N/A 02/12/2017   Procedure: Right/Left Heart Cath and Coronary Angiography;  Surgeon: Belva Crome, MD;  Location: [place] [facility];  Service: Cardiovascular;  Laterality: N/A;    Current Medications: Outpatient Medications Prior to Visit  Medication Sig Dispense Refill  . aspirin 81 MG tablet Take 1 tablet (81 mg total) by mouth daily. 30 tablet 3  . atorvastatin (LIPITOR) 80 MG tablet Take 1 tablet (80 mg total) by mouth daily at 6 PM. 30 tablet 0  . carvedilol (COREG) 12.5 MG tablet Take 12.5 mg by mouth 2 (two) times daily with a meal.    . cloNIDine (CATAPRES) 0.1 MG tablet Take 1 tablet (0.1 mg total) by mouth 2 (two) times daily. 180 tablet 1  . clopidogrel (PLAVIX) 75 MG tablet Take 1 tablet (75 mg total) by mouth daily. 90 tablet 1  . furosemide (LASIX) 40 MG tablet Take 1.5 tablets (60 mg total) by mouth daily. 137 tablet 1  . Potassium Chloride ER 20 MEQ TBCR Take 1 tablet by mouth daily. 180 tablet 1  . hydrochlorothiazide (HYDRODIURIL) 25 MG tablet Take 0.5 tablets (12.5 mg total) by mouth daily. (Patient not taking: Reported on 07/26/2018) 90 tablet 1  . sacubitril-valsartan (ENTRESTO) 97-103 MG Take 1 tablet by mouth 2 (two) times daily. (Patient not taking:  Reported on 07/26/2018) 60 tablet 5   No facility-administered medications prior to visit.      Allergies:   Patient has no known allergies.   Social History   Socioeconomic History  . Marital status: Divorced    Spouse name: Not on file  . Number of children: Not on file  . Years of education: Not on file  . Highest education level: Not on file  Occupational History  . Not on file  Social Needs  . Financial resource strain: Not on file  . Food insecurity:    Worry: Not on file    Inability: Not on file  . Transportation needs:    Medical: Not on file    Non-medical: Not on file  Tobacco Use  .  Smoking status: Former Smoker    Packs/day: 1.00    Years: 34.00    Pack years: 34.00    Types: Cigarettes    Last attempt to quit: 06/28/2016    Years since quitting: 2.0  . Smokeless tobacco: Never Used  Substance and Sexual Activity  . Alcohol use: Yes    Alcohol/week: 8.0 standard drinks    Types: 8 Shots of liquor per week    Comment: occssionally  . Drug use: Yes    Comment: 02/10/2017 "experimented w/drugs; haven't done any for years"  . Sexual activity: Not Currently  Lifestyle  . Physical activity:    Days per week: Not on file    Minutes per session: Not on file  . Stress: Not on file  Relationships  . Social connections:    Talks on phone: Not on file    Gets together: Not on file    Attends religious service: Not on file    Active member of club or organization: Not on file    Attends meetings of clubs or organizations: Not on file    Relationship status: Not on file  Other Topics Concern  . Not on file  Social History Narrative  . Not on file     Family History:  The patient's family history includes Cardiomyopathy in his mother; Hypertension in his mother; Stroke in his maternal aunt.   ROS General: Negative; No fevers, chills, or night sweats;  HEENT: positive for recent visual disturbance. sinus congestion, difficulty swallowing Pulmonary: Negative;  No cough, wheezing, shortness of breath, hemoptysis Cardiovascular: See HPI GI: Negative; No nausea, vomiting, diarrhea, or abdominal pain GU: Negative; No dysuria, hematuria, or difficulty voiding Musculoskeletal: Negative; no myalgias, joint pain, or weakness Hematologic/Oncology: Negative; no easy bruising, bleeding Endocrine: Negative; no heat/cold intolerance; no diabetes Neuro: positive for previous diplopia Skin: Negative; No rashes or skin lesions Psychiatric: Negative; No behavioral problems, depression Sleep: Negative; No snoring, daytime sleepiness, hypersomnolence, bruxism, restless legs, hypnogognic hallucinations, no cataplexy Other comprehensive 14 point system review is negative.   PHYSICAL EXAM:   VS:  BP 122/70   Pulse (!) 53   Ht _0  (1.727 m)   Wt (!) 313 lb (142 kg)   SpO2 94%   BMI 47.59 kg/m     Repeat blood pressure by me 130/70.  Wt Readings from Last 3 Encounters:  07/26/18 (!) 313 lb (142 kg)  04/18/18 (!) 317 lb 6.4 oz (144 kg)  04/04/18 (!) 318 lb (144.2 kg)    General: Alert, oriented, no distress.  Skin: normal turgor, no rashes, warm and dry HEENT: Normocephalic, atraumatic. Pupils equal round and reactive to light; sclera anicteric; extraocular muscles intact;  Nose without nasal septal hypertrophy Mouth/Parynx benign; Mallinpatti scale Neck: JVD approximately 7 cm, no carotid bruits; normal carotid upstroke Lungs: clear to ausculatation and percussion; no wheezing or rales Chest wall: without tenderness to palpitation Heart: PMI not displaced, RRR, s1 s2 normal, 1/6 systolic murmur, no diastolic murmur, no rubs, gallops, thrills, or heaves Abdomen: Central adiposity; soft, nontender; no hepatosplenomehaly, BS+; abdominal aorta nontender and not dilated by palpation. Back: no CVA tenderness Pulses 2+ Musculoskeletal: full range of motion, normal strength, no joint deformities Extremities: no clubbing cyanosis or edema, Homan's sign  negative  Neurologic: grossly nonfocal; Cranial nerves grossly wnl Psychologic: Normal mood and affect   Studies/Labs Reviewed:   ECG (independently read by me): Sinus bradycardia at 53 bpm.  Right bundle branch block. LVH by voltage; normal intervals  February 20, 2018 ECG (independently read by me): Normal sinus rhythm at 74 bpm.  Left axis deviation.  Right bundle branch block.  LVH by voltage criteria.  QTc interval 492 ms.  August 18 2017 ECG (independently read by me): Normal sinus rhythm at 66 bpm.  Right bundle branch block with repolarization changes.  Inferolateral T change.  T-wave changes.  LVH.  QTc interval 461 ms.  04/21/2017 EKG:  EKG is ordered today. Normal sinus rhythm at 77 bpm.  LVH with repolarization changes.  PR interval 184 ms.  QTc interval 488 ms.  03/11/2017 ECG (independently read by me): Normal sinus rhythm at 73 bpm.  Left axis deviation.  LVH by voltage criteria with repolarization changes.  Recent Labs: BMP Latest Ref Rng & Units 04/14/2018 04/04/2018 02/22/2018  Glucose 65 - 99 mg/dL 87 110(H) 103(H)  BUN 6 - 24 mg/dL 18 29(H) 14  Creatinine 0.76 - 1.27 mg/dL 0.88 1.08 0.76  BUN/Creat Ratio 9 - 20 20 27(H) 18  Sodium 134 - 144 mmol/L 141 138 143  Potassium 3.5 - 5.2 mmol/L 4.3 4.5 4.2  Chloride 96 - 106 mmol/L 104 106 107(H)  CO2 20 - 29 mmol/L _0 Calcium 8.7 - 10.2 mg/dL 9.2 8.8 9.0     Hepatic Function Latest Ref Rng & Units 02/22/2018 07/17/2017 03/31/2017  Total Protein 6.0 - 8.5 g/dL 7.1 7.3 6.6  Albumin 3.5 - 5.5 g/dL 3.9 3.7 3.6  AST 0 - 40 IU/L 18 33 19  ALT 0 - 44 IU/L _1 Alk Phosphatase 39 - 117 IU/L 63 53 51  Total Bilirubin 0.0 - 1.2 mg/dL 0.4 1.5(H) 1.1  Bilirubin, Direct 0.1 - 0.5 mg/dL - - -    CBC Latest Ref Rng & Units 07/17/2017 07/17/2017 02/12/2017  WBC 4.0 - 10.5 K/uL - 9.1 10.4  Hemoglobin 13.0 - 17.0 g/dL 15.0 14.4 14.4  Hematocrit 39.0 - 52.0 % 44.0 42.8 43.9  Platelets 150 - 400 K/uL - 241 236   Lab Results    Component Value Date   MCV 98.2 07/17/2017   MCV 101.2 (H) 02/12/2017   MCV 100.9 (H) 02/10/2017   No results found for: TSH Lab Results  Component Value Date   HGBA1C 5.0 07/18/2017     BNP    Component Value Date/Time   BNP 36.8 04/04/2018 0923   BNP 752.4 (H) 02/10/2017 1525    ProBNP    Component Value Date/Time   PROBNP 77 02/22/2018 1511     Lipid Panel     Component Value Date/Time   CHOL 163 07/18/2017 0558   TRIG 56 07/18/2017 0558   HDL 33 (L) 07/18/2017 0558   CHOLHDL 4.9 07/18/2017 0558   VLDL 11 07/18/2017 0558   LDLCALC 119 (H) 07/18/2017 0558     RADIOLOGY: No results found.   Additional studies/ records that were reviewed today include:  I reviewed the patient's recent hospitalization, his cardiac consultation, cardiac catheterization report, and hospitalist records. I reviewed his most recent hospitalization from September 22 through 07/19/2017 including imaging studies and echocardiogram.   ASSESSMENT:    1. Chronic combined systolic and diastolic CHF, NYHA class 2 ([place])   2. Nonischemic cardiomyopathy ([place])   3. Coronary artery disease involving native coronary artery of native heart without angina pectoris   4. Medication management   5. Essential hypertension   6. Hyperlipidemia, unspecified hyperlipidemia type      PLAN:  Nicholas Kelly is a 52 year old  African-American gentleman who has a history of morbid obesity.  BMI today is 47.59.  He has documented reduced EF noted in April 2018.  When I last saw him I initiated Entresto and subsequently he was able to be titrated up to the maximum 97/103 mg twice daily regimen.  Apparently, once his 30-day supply ran out he has been off therapy for the last several months.  I brought Nicholas Kelly into the office today.  He has been instructed to call the [name] number to see if he is able to qualify for financial assistance by the company.  We will provide him with additional samples of  Entresto at 49/51 and hopefully if he is approved he will then have free shipment from the company.  If he cannot get approved by the company, [name] will then be discontinued when he runs out and we will start him on valsartan 160 mg daily.  He continues to be on carvedilol 12.5 mg twice a day, furosemide 60 mg daily.  There is no significant edema presently.  He is on atorvastatin 80 mg for hyperlipidemia.  One year ago his LDL was elevated at 119.  Repeat laboratory will need to be obtained.  He is not having any chest tightness.  He is unaware of palpitations.  He is morbidly obese and I discussed the importance of weight loss and exercise.  He denies significant issues with sleep.  I will see him in 2 months for reevaluation   Medication Adjustments/Labs and Tests Ordered: Current medicines are reviewed at length with the patient today.  Concerns regarding medicines are outlined above.  Medication changes, Labs and Tests ordered today are listed in the Patientt Instructions below. Patient Instructions  Medication Instructions:  START Entresto 49/51 mg two times daily  Labwork: Please return for FASTING labs prior to appt in 2 months (CMET, CBC, Lipid, TSH)  Our in office lab hours are Monday-Friday 8:00-4:00, closed for lunch 12:45-1:45 pm.  No appointment needed.  Follow-Up: 2 months with Dr. Claiborne Billings  Any Other Special Instructions Will Be Listed Below (If Applicable).  Call [employer] to inquire about patient assistance [phone] Once you do this-call Kristin/Raquel (pharmacist) to make aware [phone]   If you need a refill on your cardiac medications before your next appointment, please call your pharmacy.     Signed, [name], MD , [facility] 07/28/2018 7:00 AM    [place] [address] [place], [place], [place]  [address] Phone: [phone]

## 2018-07-28 ENCOUNTER — Encounter: Payer: Self-pay | Admitting: Cardiovascular Disease

## 2018-08-22 ENCOUNTER — Encounter (HOSPITAL_COMMUNITY): Payer: Self-pay | Admitting: Adult Health

## 2018-08-31 ENCOUNTER — Telehealth: Payer: Self-pay

## 2018-08-31 NOTE — Telephone Encounter (Signed)
New message    Just an FYI. We have made several attempts to contact this patient including sending a letter to schedule or reschedule their echocardiogram. We will be removing the patient from the echo WQ.   Thank you 

## 2018-08-31 NOTE — Telephone Encounter (Signed)
Upcoming appt 12/03

## 2018-09-16 ENCOUNTER — Telehealth: Payer: Self-pay | Admitting: *Deleted

## 2018-09-16 NOTE — Telephone Encounter (Signed)
Left message for patient to call.   Patient dropped off [facility] form.  Called [facility] and was transferred to [employer]-patient was instructed to provide all necessary documents on 10/7 to apply for patient assistance and [employer] has not received as of yet.     Will need to complete patient assistance forms.

## 2018-09-21 LAB — COMPREHENSIVE METABOLIC PANEL
ALBUMIN: 4.4 g/dL (ref 3.5–5.5)
ALK PHOS: 79 IU/L (ref 39–117)
ALT: 33 IU/L (ref 0–44)
AST: 22 IU/L (ref 0–40)
Albumin/Globulin Ratio: 1.2 (ref 1.2–2.2)
BILIRUBIN TOTAL: 0.6 mg/dL (ref 0.0–1.2)
BUN/Creatinine Ratio: 10 (ref 9–20)
BUN: 10 mg/dL (ref 6–24)
CHLORIDE: 102 mmol/L (ref 96–106)
CO2: 22 mmol/L (ref 20–29)
Calcium: 9.2 mg/dL (ref 8.7–10.2)
Creatinine, Ser: 1 mg/dL (ref 0.76–1.27)
GFR calc Af Amer: 99 mL/min/{1.73_m2} (ref >59)
GFR calc non Af Amer: 86 mL/min/{1.73_m2} (ref >59)
GLOBULIN, TOTAL: 3.6 g/dL (ref 1.5–4.5)
Glucose: 87 mg/dL (ref 65–99)
POTASSIUM: 4.5 mmol/L (ref 3.5–5.2)
SODIUM: 141 mmol/L (ref 134–144)
Total Protein: 8 g/dL (ref 6.0–8.5)

## 2018-09-21 LAB — LIPID PANEL
Chol/HDL Ratio: 5.3 ratio — ABNORMAL HIGH (ref 0.0–5.0)
Cholesterol, Total: 195 mg/dL (ref 100–199)
HDL: 37 mg/dL — AB (ref >39)
LDL Calculated: 134 mg/dL — ABNORMAL HIGH (ref 0–99)
TRIGLYCERIDES: 120 mg/dL (ref 0–149)
VLDL Cholesterol Cal: 24 mg/dL (ref 5–40)

## 2018-09-21 LAB — CBC
Hematocrit: 40.6 % (ref 37.5–51.0)
Hemoglobin: 13.7 g/dL (ref 13.0–17.7)
MCH: 32.7 pg (ref 26.6–33.0)
MCHC: 33.7 g/dL (ref 31.5–35.7)
MCV: 97 fL (ref 79–97)
PLATELETS: 304 10*3/uL (ref 150–450)
RBC: 4.19 x10E6/uL (ref 4.14–5.80)
RDW: 12.2 % — ABNORMAL LOW (ref 12.3–15.4)
WBC: 8.7 10*3/uL (ref 3.4–10.8)

## 2018-09-21 LAB — TSH: TSH: 2.52 u[IU]/mL (ref 0.450–4.500)

## 2018-09-27 ENCOUNTER — Ambulatory Visit: Payer: Self-pay | Admitting: Cardiovascular Disease

## 2018-10-03 ENCOUNTER — Telehealth: Payer: Self-pay | Admitting: Cardiovascular Disease

## 2018-10-03 ENCOUNTER — Other Ambulatory Visit: Payer: Self-pay | Admitting: Cardiovascular Disease

## 2018-10-03 DIAGNOSIS — E785 Hyperlipidemia, unspecified: Secondary | ICD-10-CM

## 2018-10-03 MED ORDER — ATORVASTATIN CALCIUM 80 MG PO TABS: 80.0000 mg | ORAL_TABLET | Freq: Every day | ORAL | 3 refills | Status: DC

## 2018-10-03 MED ORDER — FUROSEMIDE 40 MG PO TABS: 60.0000 mg | ORAL_TABLET | Freq: Every day | ORAL | 1 refills | Status: DC

## 2018-10-03 MED ORDER — CLONIDINE HCL 0.1 MG PO TABS: 0.1000 mg | ORAL_TABLET | Freq: Two times a day (BID) | ORAL | 1 refills | Status: DC

## 2018-10-03 MED ORDER — CARVEDILOL 12.5 MG PO TABS: 12.5000 mg | ORAL_TABLET | Freq: Two times a day (BID) | ORAL | 3 refills | Status: DC

## 2018-10-03 MED ORDER — SACUBITRIL-VALSARTAN 49-51 MG PO TABS: 1.0000 | ORAL_TABLET | Freq: Two times a day (BID) | ORAL | 3 refills | Status: DC

## 2018-10-03 NOTE — Telephone Encounter (Signed)
Refill sent to the pharmacy electronically.  

## 2018-10-03 NOTE — Telephone Encounter (Signed)
New Message    *STAT* If patient is at the pharmacy, call can be transferred to refill team.   1. Which medications need to be refilled? (please list name of each medication and dose if known) Potassium Chloride ER 20 MEQ TBCR, atorvastatin (LIPITOR) 80 MG tablet, carvedilol (COREG) 12.5 MG tablet, cloNIDine (CATAPRES) 0.1 MG tablet, furosemide (LASIX) 40 MG tablet and sacubitril-valsartan (ENTRESTO) 49-51 MG  2. Which pharmacy/location (including street and city if local pharmacy) is medication to be sent to? [facility] - [place], [place] - [address]  3. Do they need a 30 day or 90 day supply? 90

## 2018-10-03 NOTE — Telephone Encounter (Signed)
Rx request sent to pharmacy.  

## 2018-10-14 ENCOUNTER — Encounter: Payer: Self-pay | Admitting: *Deleted

## 2018-11-22 ENCOUNTER — Ambulatory Visit (INDEPENDENT_AMBULATORY_CARE_PROVIDER_SITE_OTHER): Payer: Self-pay | Admitting: Cardiovascular Disease

## 2018-11-22 ENCOUNTER — Encounter: Payer: Self-pay | Admitting: Cardiovascular Disease

## 2018-11-22 VITALS — BP 144/88 | HR 75 | Ht 68.0 in | Wt 300.8 lb

## 2018-11-22 DIAGNOSIS — I5042 Chronic combined systolic (congestive) and diastolic (congestive) heart failure: Secondary | ICD-10-CM

## 2018-11-22 DIAGNOSIS — I2510 Atherosclerotic heart disease of native coronary artery without angina pectoris: Secondary | ICD-10-CM

## 2018-11-22 DIAGNOSIS — E785 Hyperlipidemia, unspecified: Secondary | ICD-10-CM

## 2018-11-22 DIAGNOSIS — Z79899 Other long term (current) drug therapy: Secondary | ICD-10-CM

## 2018-11-22 DIAGNOSIS — I428 Other cardiomyopathies: Secondary | ICD-10-CM

## 2018-11-22 DIAGNOSIS — I10 Essential (primary) hypertension: Secondary | ICD-10-CM

## 2018-11-22 MED ORDER — FUROSEMIDE 40 MG PO TABS: 40.0000 mg | ORAL_TABLET | Freq: Every day | ORAL | 1 refills | Status: DC

## 2018-11-22 MED ORDER — SACUBITRIL-VALSARTAN 97-103 MG PO TABS: 1.0000 | ORAL_TABLET | Freq: Two times a day (BID) | ORAL | 3 refills | Status: DC

## 2018-11-22 NOTE — Progress Notes (Signed)
Cardiology Office Note    Date:  11/22/2018   ID:  Nicholas Kelly., DOB 03-Aug-1966, MRN [id number]  PCP:  [name], FNP  Cardiologist:  [name], MD   Follow-up visit.  History of Present Illness:  Nicholas Kelly. is a 53 y.o. male who presents for a 3 month follow-up cardiology evaluation.   [name] has a history of hypertension, and presented to [facility] hospital on 02/10/2017 from urgent care with acute onset of shortness of breath.  He was previously diagnosed with CHF in October 17 and had been on HCTZ but had run out of this prescription.  He was admitted by the hospitalist service.  A chest x-ray showed findings of CHF with pulmonary edema.  BNP was elevated at 752.  An echo Doppler study showed reduced LV function with an EF of 2025% with moderate concentric LVH, diffuse hypokinesis, grade 3 diastolic dysfunction, mild mitral regurgitation, moderate tricuspid regurgitation, and moderate bony hypertension with a PA pressure 51 mm.  Patient was seen in cardiology consultation.  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  He did not have significant obstructive CAD with only diffuse 30-50% mid and distal RCA stenoses.  There is moderate pulmonary hypertension.  He was discharged on 02/14/2017 and has been on lisinopril 5 mg, furosemide 40 mg, carvedilol 6.25 mg twice a day in addition to aspirin 81 mg, supplemental potassium 20, now quit once and atorvastatin 40 mg.     I saw for initial post hospital evaluation on 03/11/2017.  I reviewed his hospital data in its entirety.  He was feeling improved on his initial medical regimen and I recommended further titration of his lisinopril up to 10 mg and increase carvedilol to 12.5 mg twice a day.  He did not have insurance and ws working on trying to obtain a Medicaid card.   We talked in the future about possibly switching him to entresto.  On his increase regimen, he has felt improved.    When I saw him in June 2018 his blood pressure continued to be elevated and I recommended the addition of spironolactone at 12.5 mg for one week and then to increase this to 12.5 mg twice a day.  I also titrated lisinopril from 10 mg to 15 mg. On July 17, 2017 he presented to the hospital with complaints of diplopia and visual disturbance. 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  An MRI of the range, did not show acute abnormality but revealed chronic ischemic microangiopathy.  He had a negative CTA, except for minimal calcified atherosclerosis involving only the carotids.  During his hospitalization.  A repeat echo Doppler study showed an EF of 30% with grade 1 diastolic dysfunction.  His aortic root was mildly dilated.  Neurology was consulted and he was felt to have had a likely CVA given his reduced LV function.  He underwent outpatient neurologic rehab.  When I saw him in October 2018 bloodpressure was still elevated and I further titrated lisinopril to 20 mg.  He was also taking Spironolactone 12.5 mg twice a day, furosemide 40 mg daily, carvedilol 12.5 twice daily in addition to atorvastatin 80 mg and Plavix and aspirin.  When I  saw him in April 2019 he had run out of  his lisinopril for the last 2 months.  His allergist had increase aspirin to 325 and he had continued to be on Plavix, carvedilol 12.5 twice a day, furosemide 40 mg and HCTZ 25 mg.  He had lost 35 pounds since October 2018.  He was no longer taking Spironolactone.  With his reduced EF I felt he was a good candidate for initiation of Entresto.  He was gradually been titrated up to maximum dose 97/103 and  saw [name] off [place] in May 2019 at which time the dose was titrated to maximum.  Apparently he took that dose for a month since he  had a 30-day free samples but unfortunately when [facility] the [facility] they were unable to fill it since he had no insurance.  When I last saw him on July 26, 2018 he was restarted with samples of and on Entresto at 49/51 mg.  At the time he also was on carvedilol 12.5 mg twice a day, furosemide 60 mg daily without edema.  He was on atorvastatin 80 mg for hyperlipidemia.    Over the past several months he has felt improved with reinitiation of Entresto therapy.  At times he still notes some abdominal bloating.  He denies any significant swelling.  Review of his medicines today indicate he is on carvedilol 12.5 twice a day, clonidine 0.1 mg twice a day, furosemide 60 mg daily and apparently HCTZ 25 mg.  He continues to be on Entresto 49/51 mg daily.  He presents for reevaluation.   Past Medical History:  Diagnosis Date  . Acute CHF (congestive heart failure) ([place]) 02/10/2017  . Dyspnea   . Hypertension   . Stroke [facility])     Past Surgical History:  Procedure Laterality Date  . NO PAST SURGERIES    . RIGHT/LEFT HEART CATH AND CORONARY ANGIOGRAPHY N/A 02/12/2017   Procedure: Right/Left Heart Cath and Coronary Angiography;  Surgeon: Belva Crome, MD;  Location: [place] [facility];  Service: Cardiovascular;  Laterality: N/A;    Current Medications: Outpatient Medications Prior to Visit  Medication Sig Dispense Refill  . aspirin 81 MG tablet Take 1 tablet (81 mg total) by mouth daily. 30 tablet 3  . atorvastatin (LIPITOR) 80 MG tablet Take 1 tablet (80 mg total) by mouth daily at 6 PM. 90 tablet 3  . carvedilol (COREG) 12.5 MG tablet Take 1 tablet (12.5 mg total) by mouth 2 (two) times daily with a meal. 180 tablet 3  . cloNIDine (CATAPRES) 0.1 MG tablet TAKE 1 TABLET (0.1 MG TOTAL) BY MOUTH 2 (TWO) TIMES DAILY. 180 tablet 1  . cloNIDine (CATAPRES) 0.1 MG tablet Take 1 tablet (0.1 mg total) by mouth 2 (two) times daily. 180 tablet 1  . Potassium Chloride ER 20 MEQ  TBCR TAKE 1 TABLET BY MOUTH ONCE DAILY 180 tablet 0  . furosemide (LASIX) 40 MG tablet Take 1.5 tablets (60 mg total) by mouth daily. 137 tablet 1  . hydrochlorothiazide (HYDRODIURIL) 25 MG tablet TAKE 1 TABLET (25 MG TOTAL) BY MOUTH DAILY. 90 tablet 1  . sacubitril-valsartan (ENTRESTO) 49-51 MG Take 1 tablet by mouth 2 (two) times daily. 180 tablet 3   No facility-administered medications prior to visit.      Allergies:   Patient has no known allergies.   Social History   Socioeconomic History  . Marital status: Divorced    Spouse name: Not on file  . Number of children: Not on file  . Years of education:  Not on file  . Highest education level: Not on file  Occupational History  . Not on file  Social Needs  . Financial resource strain: Not on file  . Food insecurity:    Worry: Not on file    Inability: Not on file  . Transportation needs:    Medical: Not on file    Non-medical: Not on file  Tobacco Use  . Smoking status: Former Smoker    Packs/day: 1.00    Years: 34.00    Pack years: 34.00    Types: Cigarettes    Last attempt to quit: 06/28/2016    Years since quitting: 2.4  . Smokeless tobacco: Never Used  Substance and Sexual Activity  . Alcohol use: Yes    Alcohol/week: 8.0 standard drinks    Types: 8 Shots of liquor per week    Comment: occssionally  . Drug use: Yes    Comment: 02/10/2017 "experimented w/drugs; haven't done any for years"  . Sexual activity: Not Currently  Lifestyle  . Physical activity:    Days per week: Not on file    Minutes per session: Not on file  . Stress: Not on file  Relationships  . Social connections:    Talks on phone: Not on file    Gets together: Not on file    Attends religious service: Not on file    Active member of club or organization: Not on file    Attends meetings of clubs or organizations: Not on file    Relationship status: Not on file  Other Topics Concern  . Not on file  Social History Narrative  . Not on file       Family History:  The patient's family history includes Cardiomyopathy in his mother; Hypertension in his mother; Stroke in his maternal aunt.   ROS General: Negative; No fevers, chills, or night sweats;  HEENT: positive for recent visual disturbance. sinus congestion, difficulty swallowing Pulmonary: Negative; No cough, wheezing, shortness of breath, hemoptysis Cardiovascular: See HPI GI: Negative; No nausea, vomiting, diarrhea, or abdominal pain GU: Negative; No dysuria, hematuria, or difficulty voiding Musculoskeletal: Negative; no myalgias, joint pain, or weakness Hematologic/Oncology: Negative; no easy bruising, bleeding Endocrine: Negative; no heat/cold intolerance; no diabetes Neuro: positive for previous diplopia Skin: Negative; No rashes or skin lesions Psychiatric: Negative; No behavioral problems, depression Sleep: Negative; No snoring, daytime sleepiness, hypersomnolence, bruxism, restless legs, hypnogognic hallucinations, no cataplexy Other comprehensive 14 point system review is negative.   PHYSICAL EXAM:   VS:  BP (!) 144/88   Pulse 75   Ht '5\' 8"'  (1.727 m)   Wt (!) 300 lb 12.8 oz (136.4 kg)   SpO2 96%   BMI 45.74 kg/m     Repeat blood pressure by me was 124/84  Wt Readings from Last 3 Encounters:  11/22/18 (!) 300 lb 12.8 oz (136.4 kg)  07/26/18 (!) 313 lb (142 kg)  04/18/18 (!) 317 lb 6.4 oz (144 kg)    General: Alert, oriented, no distress.  Skin: normal turgor, no rashes, warm and dry HEENT: Normocephalic, atraumatic. Pupils equal round and reactive to light; sclera anicteric; extraocular muscles intact;  Nose without nasal septal hypertrophy Mouth/Parynx benign; Mallinpatti scale 3 Neck: JVD at approximately 7 cm water pressure., no carotid bruits; normal carotid upstroke Lungs: clear to ausculatation and percussion; no wheezing or rales Chest wall: without tenderness to palpitation Heart: PMI not displaced, RRR, s1 s2 normal, 1/6 systolic murmur,  no diastolic murmur, no rubs, gallops, thrills, or  heaves Abdomen: Moderate central adiposity; soft, nontender; no hepatosplenomehaly, BS+; abdominal aorta nontender and not dilated by palpation. Back: no CVA tenderness Pulses 2+ Musculoskeletal: full range of motion, normal strength, no joint deformities Extremities: no clubbing cyanosis or edema, Homan's sign negative  Neurologic: grossly nonfocal; Cranial nerves grossly wnl Psychologic: Normal mood and affect   Studies/Labs Reviewed:   ECG (independently read by me): Sinus rhythm with occasional PACs as well as PVC.  Left axis deviation.  Right bundle branch block.  LVH by voltage.  July 26, 2018 ECG (independently read by me): Sinus bradycardia at 53 bpm.  Right bundle branch block. LVH by voltage; normal intervals  February 20, 2018 ECG (independently read by me): Normal sinus rhythm at 74 bpm.  Left axis deviation.  Right bundle branch block.  LVH by voltage criteria.  QTc interval 492 ms.  August 18 2017 ECG (independently read by me): Normal sinus rhythm at 66 bpm.  Right bundle branch block with repolarization changes.  Inferolateral T change.  T-wave changes.  LVH.  QTc interval 461 ms.  04/21/2017 EKG:  EKG is ordered today. Normal sinus rhythm at 77 bpm.  LVH with repolarization changes.  PR interval 184 ms.  QTc interval 488 ms.  03/11/2017 ECG (independently read by me): Normal sinus rhythm at 73 bpm.  Left axis deviation.  LVH by voltage criteria with repolarization changes.  Recent Labs: BMP Latest Ref Rng & Units 09/20/2018 04/14/2018 04/04/2018  Glucose 65 - 99 mg/dL 87 87 110(H)  BUN 6 - 24 mg/dL 10 18 29(H)  Creatinine 0.76 - 1.27 mg/dL 1.00 0.88 1.08  BUN/Creat Ratio 9 - '20 10 20 ' 27(H)  Sodium 134 - 144 mmol/L 141 141 138  Potassium 3.5 - 5.2 mmol/L 4.5 4.3 4.5  Chloride 96 - 106 mmol/L 102 104 106  CO2 20 - 29 mmol/L '22 25 20  ' Calcium 8.7 - 10.2 mg/dL 9.2 9.2 8.8     Hepatic Function Latest Ref Rng & Units  09/20/2018 02/22/2018 07/17/2017  Total Protein 6.0 - 8.5 g/dL 8.0 7.1 7.3  Albumin 3.5 - 5.5 g/dL 4.4 3.9 3.7  AST 0 - 40 IU/L 22 18 33  ALT 0 - 44 IU/L 33 15 26  Alk Phosphatase 39 - 117 IU/L 79 63 53  Total Bilirubin 0.0 - 1.2 mg/dL 0.6 0.4 1.5(H)  Bilirubin, Direct 0.1 - 0.5 mg/dL - - -    CBC Latest Ref Rng & Units 09/20/2018 07/17/2017 07/17/2017  WBC 3.4 - 10.8 x10E3/uL 8.7 - 9.1  Hemoglobin 13.0 - 17.7 g/dL 13.7 15.0 14.4  Hematocrit 37.5 - 51.0 % 40.6 44.0 42.8  Platelets 150 - 450 x10E3/uL 304 - 241   Lab Results  Component Value Date   MCV 97 09/20/2018   MCV 98.2 07/17/2017   MCV 101.2 (H) 02/12/2017   Lab Results  Component Value Date   TSH 2.520 09/20/2018   Lab Results  Component Value Date   HGBA1C 5.0 07/18/2017     BNP    Component Value Date/Time   BNP 36.8 04/04/2018 0923   BNP 752.4 (H) 02/10/2017 1525    ProBNP    Component Value Date/Time   PROBNP 77 02/22/2018 1511     Lipid Panel     Component Value Date/Time   CHOL 195 09/20/2018 0941   TRIG 120 09/20/2018 0941   HDL 37 (L) 09/20/2018 0941   CHOLHDL 5.3 (H) 09/20/2018 0941   CHOLHDL 4.9 07/18/2017 0558   VLDL 11  07/18/2017 0558   LDLCALC 134 (H) 09/20/2018 0941     RADIOLOGY: No results found.   Additional studies/ records that were reviewed today include:  I reviewed the patient's recent hospitalization, his cardiac consultation, cardiac catheterization report, and hospitalist records. I reviewed his hospitalization from September 22 through 07/19/2017 including imaging studies and echocardiogram.   ASSESSMENT:    1. Nonischemic cardiomyopathy ([place])   2. Chronic combined systolic and diastolic CHF, NYHA class 2 ([place])   3. Medication management   4. Essential hypertension   5. Coronary artery disease involving native coronary artery of native heart without angina pectoris   6. Hyperlipidemia, unspecified hyperlipidemia type   7. Morbid obesity [facility])     PLAN:  Mr.  Berna Spare is a 53 year old African-American gentleman who has a history of morbid obesity and  has documented reduced EF noted in April 2018.  He has been an excellent candidate for [facility] and previously had been uptitrated to 97/103 twice daily with improvement but unfortunately had stopped therapy when he could not get additional medication.  He ultimately was able to be restarted back on Entresto and over the past several months has been on 49/51 mg twice a day with benefit.  He has been on carvedilol 12.5 mg twice a day and recently has been on both HCTZ and Lasix 60 mg daily.  At present, he has no signs of edema and appears euvolemic on examination.  So that I may further titrate Entresto back to maximum dosing I have suggested he discontinue HCTZ and I have also suggested he decrease Lasix to 40 mg.  He will increase Entresto to 97/103 mg twice a day.  After 2 weeks, I will further titrate carvedilol to 18.75 mg twice a day with ultimate plans to titrate this to 25 mg twice a day for maximum benefit.  In 3 to 4 weeks we will obtain a BMP and proBNP as well as follow-up chemistry and lipid studies.  He continues to be on atorvastatin 80 mg and most recent laboratory in November showed an LDL cholesterol at 134.  When his laboratories repeated in several months if LDL is still elevated Zetia will be added to his medical regimen.  We discussed weight loss.  He has lost weight since his prior evaluations but continues to be morbidly obese with a BMI of 45.74.  I will see him in 2 months for reevaluation.   Medication Adjustments/Labs and Tests Ordered: Current medicines are reviewed at length with the patient today.  Concerns regarding medicines are outlined above.  Medication changes, Labs and Tests ordered today are listed in the Patientt Instructions below. Patient Instructions  Medication Instructions:  STOP HYDROCHLOROTHIAZIDE DECREASE LASIX 40MG DAILY INCREASE ENTRESTO 97/103MG DAILY IN 2  WEEKS INCREASE CARVEDILOL 18.75MG TWICE DAILY (1-1/2 TABLETS) If you need a refill on your cardiac medications before your next appointment, please call your pharmacy.  Labwork: FASTING LIPID, BMP AND PRO-BNP-IN 3-4 WEEKS HERE IN OUR OFFICE AT [facility]   You will need to fast. DO NOT EAT OR DRINK PAST MIDNIGHT.      Take the provided lab slips with you to the lab for your blood draw.   When you have your labs (blood work) drawn today and your tests are completely normal, you will receive your results only by MyChart Message (if you have MyChart) -OR-  A paper copy in the mail.  If you have any lab test that is abnormal or we need to change your treatment,  we will call you to review these results.  Follow-Up: You will need a follow up appointment in 2 months.  Please call our office 2 months in advance to schedule this appointment.  You may see [name], MD or one of the following Advanced Practice Providers on your designated Care Team:  Almyra Deforest, PA-C  Fabian Sharp, PA-C   At [facility], you and your health needs are our priority.  As part of our continuing mission to provide you with exceptional heart care, we have created designated Provider Care Teams.  These Care Teams include your primary Cardiologist (physician) and Advanced Practice Providers (APPs -  Physician Assistants and Nurse Practitioners) who all work together to provide you with the care you need, when you need it.  Thank you for choosing [facility] at [facility]!!        Signed, [name], MD , [facility] 11/22/2018 [facility] [address] [place], [place], [place]  [address] Phone: [phone]

## 2018-11-22 NOTE — Patient Instructions (Addendum)
Medication Instructions:  STOP HYDROCHLOROTHIAZIDE DECREASE LASIX 40MG  DAILY INCREASE ENTRESTO 97/103MG  DAILY IN 2 WEEKS INCREASE CARVEDILOL 18.75MG  TWICE DAILY (1-1/2 TABLETS) If you need a refill on your cardiac medications before your next appointment, please call your pharmacy.  Labwork: FASTING LIPID, BMP AND PRO-BNP-IN 3-4 WEEKS HERE IN OUR OFFICE AT [facility]   You will need to fast. DO NOT EAT OR DRINK PAST MIDNIGHT.      Take the provided lab slips with you to the lab for your blood draw.   When you have your labs (blood work) drawn today and your tests are completely normal, you will receive your results only by MyChart Message (if you have MyChart) -OR-  A paper copy in the mail.  If you have any lab test that is abnormal or we need to change your treatment, we will call you to review these results.  Follow-Up: You will need a follow up appointment in 2 months.  Please call our office 2 months in advance to schedule this appointment.  You may see Nicki Guadalajara, MD or one of the following Advanced Practice Providers on your designated Care Team:  Azalee Course, PA-C  Micah Flesher, PA-C   At [facility], you and your health needs are our priority.  As part of our continuing mission to provide you with exceptional heart care, we have created designated Provider Care Teams.  These Care Teams include your primary Cardiologist (physician) and Advanced Practice Providers (APPs -  Physician Assistants and Nurse Practitioners) who all work together to provide you with the care you need, when you need it.  Thank you for choosing [facility] at [facility]!!

## 2018-11-23 NOTE — Addendum Note (Signed)
Addended by: Neta Ehlers on: 11/23/2018 04:24 PM   Modules accepted: Orders

## 2019-05-30 ENCOUNTER — Telehealth: Payer: Self-pay

## 2019-05-30 NOTE — Telephone Encounter (Signed)
Called patient- LVM, advising I had received citizens disability paperwork per patient- I advised patient that he was over due for an appointment, and needed to either see an APP or Dr.Kelly before this paperwork could be completed due to the information being months old from last visit. I advised I would hold onto this paperwork and to call us back with questions and to make appointment.  Left call back number. Paperwork on desk.

## 2019-06-15 ENCOUNTER — Emergency Department (HOSPITAL_COMMUNITY): Payer: Self-pay

## 2019-06-15 ENCOUNTER — Other Ambulatory Visit: Payer: Self-pay

## 2019-06-15 ENCOUNTER — Emergency Department (HOSPITAL_COMMUNITY)
Admission: EM | Admit: 2019-06-15 | Discharge: 2019-06-16 | Disposition: A | Discharge status: Home / Self Care | Payer: Self-pay | Attending: Emergency Medicine | Admitting: Emergency Medicine

## 2019-06-15 ENCOUNTER — Encounter (HOSPITAL_COMMUNITY): Payer: Self-pay | Admitting: Emergency Medicine

## 2019-06-15 DIAGNOSIS — Z7982 Long term (current) use of aspirin: Secondary | ICD-10-CM | POA: Insufficient documentation

## 2019-06-15 DIAGNOSIS — Z87891 Personal history of nicotine dependence: Secondary | ICD-10-CM | POA: Insufficient documentation

## 2019-06-15 DIAGNOSIS — R29898 Other symptoms and signs involving the musculoskeletal system: Secondary | ICD-10-CM

## 2019-06-15 DIAGNOSIS — I10 Essential (primary) hypertension: Secondary | ICD-10-CM | POA: Insufficient documentation

## 2019-06-15 DIAGNOSIS — I509 Heart failure, unspecified: Secondary | ICD-10-CM | POA: Insufficient documentation

## 2019-06-15 DIAGNOSIS — Z79899 Other long term (current) drug therapy: Secondary | ICD-10-CM | POA: Insufficient documentation

## 2019-06-15 DIAGNOSIS — R278 Other lack of coordination: Secondary | ICD-10-CM | POA: Insufficient documentation

## 2019-06-15 DIAGNOSIS — R531 Weakness: Secondary | ICD-10-CM | POA: Insufficient documentation

## 2019-06-15 LAB — CBC
HCT: 43.4 % (ref 39.0–52.0)
Hemoglobin: 13.7 g/dL (ref 13.0–17.0)
MCH: 31.8 pg (ref 26.0–34.0)
MCHC: 31.6 g/dL (ref 30.0–36.0)
MCV: 100.7 fL — ABNORMAL HIGH (ref 80.0–100.0)
Platelets: 273 10*3/uL (ref 150–400)
RBC: 4.31 MIL/uL (ref 4.22–5.81)
RDW: 12 % (ref 11.5–15.5)
WBC: 9.5 10*3/uL (ref 4.0–10.5)
nRBC: 0 % (ref 0.0–0.2)

## 2019-06-15 LAB — PROTIME-INR
INR: 1.2 (ref 0.8–1.2)
Prothrombin Time: 14.7 seconds (ref 11.4–15.2)

## 2019-06-15 LAB — DIFFERENTIAL
Abs Immature Granulocytes: 0.03 10*3/uL (ref 0.00–0.07)
Basophils Absolute: 0 10*3/uL (ref 0.0–0.1)
Basophils Relative: 0 %
Eosinophils Absolute: 0.3 10*3/uL (ref 0.0–0.5)
Eosinophils Relative: 4 %
Immature Granulocytes: 0 %
Lymphocytes Relative: 31 %
Lymphs Abs: 2.9 10*3/uL (ref 0.7–4.0)
Monocytes Absolute: 1.4 10*3/uL — ABNORMAL HIGH (ref 0.1–1.0)
Monocytes Relative: 14 %
Neutro Abs: 4.8 10*3/uL (ref 1.7–7.7)
Neutrophils Relative %: 51 %

## 2019-06-15 LAB — COMPREHENSIVE METABOLIC PANEL
ALT: 18 U/L (ref 0–44)
AST: 20 U/L (ref 15–41)
Albumin: 3.6 g/dL (ref 3.5–5.0)
Alkaline Phosphatase: 61 U/L (ref 38–126)
Anion gap: 10 (ref 5–15)
BUN: 24 mg/dL — ABNORMAL HIGH (ref 6–20)
CO2: 25 mmol/L (ref 22–32)
Calcium: 9 mg/dL (ref 8.9–10.3)
Chloride: 105 mmol/L (ref 98–111)
Creatinine, Ser: 1.16 mg/dL (ref 0.61–1.24)
GFR calc Af Amer: >60 mL/min (ref >60)
GFR calc non Af Amer: >60 mL/min (ref >60)
Glucose, Bld: 94 mg/dL (ref 70–99)
Potassium: 4.4 mmol/L (ref 3.5–5.1)
Sodium: 140 mmol/L (ref 135–145)
Total Bilirubin: 0.7 mg/dL (ref 0.3–1.2)
Total Protein: 7.5 g/dL (ref 6.5–8.1)

## 2019-06-15 LAB — I-STAT CHEM 8, ED
BUN: 26 mg/dL — ABNORMAL HIGH (ref 6–20)
Calcium, Ion: 1.15 mmol/L (ref 1.15–1.40)
Chloride: 105 mmol/L (ref 98–111)
Creatinine, Ser: 1.2 mg/dL (ref 0.61–1.24)
Glucose, Bld: 90 mg/dL (ref 70–99)
HCT: 42 % (ref 39.0–52.0)
Hemoglobin: 14.3 g/dL (ref 13.0–17.0)
Potassium: 4.3 mmol/L (ref 3.5–5.1)
Sodium: 141 mmol/L (ref 135–145)
TCO2: 26 mmol/L (ref 22–32)

## 2019-06-15 LAB — APTT: aPTT: 30 seconds (ref 24–36)

## 2019-06-15 MED ORDER — SODIUM CHLORIDE 0.9% FLUSH
3.0000 mL | Freq: Once | INTRAVENOUS | Status: AC
Start: 2019-06-15 — End: 2019-06-16
  Administered 2019-06-16: 02:00:00 3 mL via INTRAVENOUS

## 2019-06-15 NOTE — ED Triage Notes (Signed)
Patient reports L arm weakness, heaviness, and stiffness since Friday - states when he went to sleep the night before, he was fine and felt it upon waking the next morning. L arm drift noted, but grip appears as strong as R arm. He denies numbness/tingling. No facial droop, speech or vision changes, or other extremity weakness noted. A&O x 4. Ambulatory with steady gait.

## 2019-06-16 ENCOUNTER — Other Ambulatory Visit: Payer: Self-pay

## 2019-06-16 ENCOUNTER — Emergency Department (HOSPITAL_COMMUNITY): Payer: Self-pay

## 2019-06-16 MED ORDER — LORAZEPAM 2 MG/ML IJ SOLN
1.0000 mg | Freq: Once | INTRAMUSCULAR | Status: AC
  Administered 2019-06-16: 1 mg via INTRAVENOUS
  Filled 2019-06-16: qty 1

## 2019-06-16 NOTE — Discharge Instructions (Addendum)
There are no signs of stroke at this time.  You may need to have a repeat MRI in 2 weeks with contrast of your brain.  There is a chance that you may have a disease similar to multiple sclerosis.  Please watch your  symptoms at home.  If there are worsening symptoms at home please return.  Please see below for further instructions  RETURN IMMEDIATELY IF  you develop new shortness of breath, chest pain, fever, have difficulty moving parts of your body (new weakness, numbness, or incoordination), sudden change in speech, vision, swallowing, or understanding, faint or develop new dizziness, severe headache, become poorly responsive or have an altered mental status compared to baseline for you, new rash, abdominal pain, or bloody stools,  Return sooner also if you develop new problems for which you have not talked to your caregiver but you feel may be emergency medical conditions.

## 2019-06-16 NOTE — ED Provider Notes (Signed)
[place] EMERGENCY DEPARTMENT Provider Note   CSN: [id number] Arrival date & time: 06/15/19  1836     History   Chief Complaint Chief Complaint  Patient presents with  . Extremity Weakness    HPI Nicholas Kelly. is a 53 y.o. male.     The history is provided by the patient.  Neurologic Problem This is a new problem. The current episode started more than 2 days ago. The problem occurs daily. The problem has been gradually worsening. Pertinent negatives include no chest pain, no abdominal pain, no headaches and no shortness of breath. Nothing aggravates the symptoms. Nothing relieves the symptoms.  Patient with history of CHF, hypertension, stroke, presents with left arm weakness.  He reports approximately 1 week prior he woke up with left arm weakness.  He had difficulty with coordination of left arm.  He also thinks his speech is not right.  No other complaints.  No fevers or vomiting.  No headache.  No new vision changes.  No other weakness or numbness is reported.  He has mild numbness in the left arm  Past Medical History:  Diagnosis Date  . Acute CHF (congestive heart failure) ([place]) 02/10/2017  . Dyspnea   . Hypertension   . Stroke [facility])     Patient Active Problem List   Diagnosis Date Noted  . Hyperlipidemia 09/20/2017  . Cerebral embolism with cerebral infarction 07/19/2017  . Essential hypertension 07/17/2017  . Vision disturbance 07/17/2017  . Non-ischemic cardiomyopathy ([place]) 03/09/2017  . Chronic combined systolic and diastolic CHF (congestive heart failure) ([place]) 02/10/2017  . Hypertensive urgency 02/10/2017  . Morbid obesity ([place]) 02/10/2017  . Tobacco abuse 02/10/2017  . Acute exacerbation of CHF (congestive heart failure) ([place]) 02/10/2017    Past Surgical History:  Procedure Laterality Date  . NO PAST SURGERIES    . RIGHT/LEFT HEART CATH AND CORONARY ANGIOGRAPHY N/A 02/12/2017   Procedure: Right/Left Heart Cath and Coronary  Angiography;  Surgeon: Belva Crome, MD;  Location: [facility] [facility];  Service: Cardiovascular;  Laterality: N/A;        Home Medications    Prior to Admission medications   Medication Sig Start Date End Date Taking? Authorizing Provider  aspirin 81 MG tablet Take 1 tablet (81 mg total) by mouth daily. 01/31/18   Troy Sine, MD  atorvastatin (LIPITOR) 80 MG tablet Take 1 tablet (80 mg total) by mouth daily at 6 PM. 10/03/18   Troy Sine, MD  carvedilol (COREG) 12.5 MG tablet Take 1 tablet (12.5 mg total) by mouth 2 (two) times daily with a meal. 10/03/18   Troy Sine, MD  cloNIDine (CATAPRES) 0.1 MG tablet TAKE 1 TABLET (0.1 MG TOTAL) BY MOUTH 2 (TWO) TIMES DAILY. 10/03/18   Troy Sine, MD  cloNIDine (CATAPRES) 0.1 MG tablet Take 1 tablet (0.1 mg total) by mouth 2 (two) times daily. 10/03/18   Troy Sine, MD  furosemide (LASIX) 40 MG tablet Take 1 tablet (40 mg total) by mouth daily. 11/22/18   Troy Sine, MD  Potassium Chloride ER 20 MEQ TBCR TAKE 1 TABLET BY MOUTH ONCE DAILY 10/03/18   Troy Sine, MD  sacubitril-valsartan (ENTRESTO) 97-103 MG Take 1 tablet by mouth 2 (two) times daily. 11/22/18   Troy Sine, MD    Family History Family History  Problem Relation Age of Onset  . Hypertension Mother   . Cardiomyopathy Mother   . Stroke Maternal Aunt  Social History Social History   Tobacco Use  . Smoking status: Former Smoker    Packs/day: 1.00    Years: 34.00    Pack years: 34.00    Types: Cigarettes    Quit date: 06/28/2016    Years since quitting: 2.9  . Smokeless tobacco: Never Used  Substance Use Topics  . Alcohol use: Yes    Alcohol/week: 8.0 standard drinks    Types: 8 Shots of liquor per week    Comment: occssionally  . Drug use: Yes    Comment: 02/10/2017 "experimented w/drugs; haven't done any for years"     Allergies   Patient has no known allergies.   Review of Systems Review of Systems  Constitutional: Negative for  fever.  Eyes: Negative for visual disturbance.  Respiratory: Negative for shortness of breath.   Cardiovascular: Negative for chest pain.  Gastrointestinal: Negative for abdominal pain.  Neurological: Positive for speech difficulty, weakness and numbness. Negative for headaches.  All other systems reviewed and are negative.    Physical Exam Updated Vital Signs BP (!) 145/93   Pulse 73   Temp 98.3 F (36.8 C) (Oral)   Resp 20   Ht 1.727 m (5\' 8" )   Wt 136.1 kg   SpO2 96%   BMI 45.61 kg/m   Physical Exam CONSTITUTIONAL: Well developed/well nourished HEAD: Normocephalic/atraumatic EYES: EOMI/PERRL, no nystagmus, no visual field deficit  no ptosis ENMT: Mucous membranes moist NECK: supple no meningeal signs, no bruits CV: S1/S2 noted, no murmurs/rubs/gallops noted LUNGS: Lungs are clear to auscultation bilaterally, no apparent distress ABDOMEN: soft, nontender, no rebound or guarding GU:no cva tenderness NEURO:Awake/alert, face symmetric, no arm or leg drift is noted ?  Mild dysarthria Equal 5/5 strength with shoulder abduction, elbow flex/extension, wrist flex/extension in upper extremities and equal hand grips bilaterally Equal 5/5 strength with hip flexion,knee flex/extension, foot dorsi/plantar flexion Cranial nerves 3/4/5/6/05/03/09/11/12 tested and intact Dysmetria noted with left UE Sensation to light touch intact in all extremities EXTREMITIES: pulses normal, full ROM SKIN: warm, color normal PSYCH: no abnormalities of mood noted   ED Treatments / Results  Labs (all labs ordered are listed, but only abnormal results are displayed) Labs Reviewed  CBC - Abnormal; Notable for the following components:      Result Value   MCV 100.7 (*)    All other components within normal limits  DIFFERENTIAL - Abnormal; Notable for the following components:   Monocytes Absolute 1.4 (*)    All other components within normal limits  COMPREHENSIVE METABOLIC PANEL - Abnormal;  Notable for the following components:   BUN 24 (*)    All other components within normal limits  I-STAT CHEM 8, ED - Abnormal; Notable for the following components:   BUN 26 (*)    All other components within normal limits  PROTIME-INR  APTT    EKG EKG Interpretation  Date/Time:  Thursday June 15 2019 18:46:43 EDT Ventricular Rate:  64 PR Interval:  198 QRS Duration: 162 QT Interval:  444 QTC Calculation: 458 R Axis:   -40 Text Interpretation:  Normal sinus rhythm Left axis deviation Right bundle branch block Left ventricular hypertrophy more pronounced Twave inversion inferiorly Confirmed by Gwyneth SproutPlunkett, [place] ([id number]) on 06/15/2019 6:57:07 PM   Radiology Ct Head Wo Contrast  Result Date: 06/15/2019 CLINICAL DATA:  53 year old male with acute LEFT arm weakness for 6 days. EXAM: CT HEAD WITHOUT CONTRAST TECHNIQUE: Contiguous axial images were obtained from the base of the skull through the vertex without  intravenous contrast. COMPARISON:  07/18/2017 CT and prior studies FINDINGS: Brain: No evidence of acute infarction, hemorrhage, hydrocephalus, extra-axial collection or mass lesion/mass effect. Atrophy and chronic small-vessel white matter ischemic changes again noted. Vascular: Carotid atherosclerotic calcifications again noted. Skull: Negative for fracture or focal lesion. Sinuses/Orbits: No acute finding. Other: Multiple dental caries and periapical abscesses are noted. IMPRESSION: 1. No evidence of acute intracranial abnormality. 2. Atrophy and chronic small-vessel white matter ischemic changes 3. Multiple dental caries and periapical abscesses. Electronically Signed   By: Harmon Pier M.D.   On: 06/15/2019 19:50   Mr Brain Wo Contrast  Result Date: 06/16/2019 CLINICAL DATA:  Initial evaluation for acute left arm weakness for several days. EXAM: MRI HEAD WITHOUT CONTRAST MRI CERVICAL SPINE WITHOUT CONTRAST TECHNIQUE: Multiplanar, multiecho pulse sequences of the brain and surrounding  structures, and cervical spine, to include the craniocervical junction and cervicothoracic junction, were obtained without intravenous contrast. COMPARISON:  Comparison made with prior CT from 06/15/2019. FINDINGS: MRI HEAD FINDINGS Brain: Examination moderately degraded by motion artifact. Cerebral volume within normal limits for age. Patchy and confluent T2/FLAIR hyperintensity within the periventricular and deep white matter both cerebral hemispheres present, nonspecific, but could reflect sequelae of underlying demyelinating disease. Patchy involvement of the pons. Few scattered superimposed remote lacunar infarcts noted. No abnormal foci of restricted diffusion to suggest acute or subacute ischemia. Gray-white matter differentiation maintained. No appreciable areas of remote cortical infarction. No foci of susceptibility artifact to suggest acute or chronic intracranial hemorrhage. No mass lesion, midline shift or mass effect. No hydrocephalus. No extra-axial fluid collection. Pituitary gland within normal limits. Vascular: Major intracranial vascular flow voids maintained. Skull and upper cervical spine: Craniocervical junction within normal limits. Bone marrow signal intensity normal. Small lipoma noted at the left frontal scalp. Sinuses/Orbits: Globes and orbital soft tissues within normal limits. Paranasal sinuses are largely clear. Small left mastoid effusion noted, of doubtful significance. Inner ear structures grossly normal. Other: None. MRI CERVICAL SPINE FINDINGS Alignment: Examination moderately degraded by motion artifact. Diffuse straightening of the normal cervical lordosis. No listhesis. Vertebrae: Vertebral body height maintained without evidence for acute or chronic fracture. Bone marrow signal intensity diffusely decreased on T1 weighted imaging, nonspecific, but most commonly related to anemia, smoking, or obesity. No discrete or worrisome osseous lesions. Mild reactive endplate changes  present about the C5-6 and C6-7 interspaces. No abnormal marrow edema. Cord: Evaluation of the cervical spinal cord markedly limited by motion artifact. Patchy signal abnormality within the central/dorsal aspect of the cord at the level of C2-3 (series 23, image 4). There is apparent patchy cord signal abnormality within the left posterior cord at the level of C3-4 (series 23, image 9). No other definite or convincing signal abnormality identified, although evaluation limited by motion. Overall cord caliber within normal limits. Posterior Fossa, vertebral arteries, paraspinal tissues: Craniocervical junction normal. Paraspinous and prevertebral soft tissues within normal limits. Normal intravascular flow voids seen within the vertebral arteries bilaterally. Disc levels: C2-C3: Central disc protrusion indents the ventral thecal sac. Mild spinal stenosis. Foramina remain patent. C3-C4: Central/right paracentral disc osteophyte indents the ventral thecal sac, contacting the ventral spinal cord. Mild spinal stenosis without significant cord deformity. Right greater than left uncovertebral hypertrophy with resultant mild right C4 foraminal narrowing. Left neural foramina widely patent. C4-C5: Mild diffuse disc bulge with bilateral uncovertebral hypertrophy, greater on the left. Mild spinal stenosis. Mild to moderate left C5 foraminal narrowing. No significant right foraminal encroachment. C5-C6: Mild diffuse disc bulge with bilateral  uncovertebral hypertrophy. Mild spinal stenosis. Mild left greater than right C6 foraminal narrowing. C6-C7: Mild disc bulge. No significant canal or foraminal stenosis. C7-T1:  Unremarkable. Visualized upper thoracic spine demonstrates no significant finding. IMPRESSION: MRI HEAD IMPRESSION: 1. Technically limited exam due to extensive motion artifact. 2. Patchy and confluent T2/FLAIR signal abnormality involving the periventricular and deep white matter of both cerebral hemispheres as well  as the pons. While a degree of chronic microvascular ischemic changes may be contributory, overall appearance is suspicious for possible underlying demyelinating disease, particularly given the findings in the cervical spine as below. No associated restricted diffusion to suggest active demyelination. 3. No other acute intracranial abnormality identified. MRI CERVICAL SPINE IMPRESSION: 1. Technically limited exam due to extensive motion artifact. 2. Patchy cord signal abnormality involving the upper cervical spinal cord at the level of C2-3 and C3-4 as above, suspicious for possible demyelinating disease. No obvious cord expansion to suggest active demyelination, although evaluation limited on this motion degraded exam. 3. 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 Mild right C4 foraminal narrowing, with mild to moderate left C5 foraminal stenosis, and mild bilateral C7 foraminal narrowing. Electronically Signed   By: Rise MuBenjamin  McClintock M.D.   On: 06/16/2019 03:48   Mr Cervical Spine Wo Contrast  Result Date: 06/16/2019 CLINICAL DATA:  Initial evaluation for acute left arm weakness for several days. EXAM: MRI HEAD WITHOUT CONTRAST MRI CERVICAL SPINE WITHOUT CONTRAST TECHNIQUE: Multiplanar, multiecho pulse sequences of the brain and surrounding structures, and cervical spine, to include the craniocervical junction and cervicothoracic junction, were obtained without intravenous contrast. COMPARISON:  Comparison made with prior CT from 06/15/2019. FINDINGS: MRI HEAD FINDINGS Brain: Examination moderately degraded by motion artifact. Cerebral volume within normal limits for age. Patchy and confluent T2/FLAIR hyperintensity within the periventricular and deep white matter both cerebral hemispheres present, nonspecific, but could reflect sequelae of underlying demyelinating disease. Patchy involvement of the pons. Few scattered superimposed remote  lacunar infarcts noted. No abnormal foci of restricted diffusion to suggest acute or subacute ischemia. Gray-white matter differentiation maintained. No appreciable areas of remote cortical infarction. No foci of susceptibility artifact to suggest acute or chronic intracranial hemorrhage. No mass lesion, midline shift or mass effect. No hydrocephalus. No extra-axial fluid collection. Pituitary gland within normal limits. Vascular: Major intracranial vascular flow voids maintained. Skull and upper cervical spine: Craniocervical junction within normal limits. Bone marrow signal intensity normal. Small lipoma noted at the left frontal scalp. Sinuses/Orbits: Globes and orbital soft tissues within normal limits. Paranasal sinuses are largely clear. Small left mastoid effusion noted, of doubtful significance. Inner ear structures grossly normal. Other: None. MRI CERVICAL SPINE FINDINGS Alignment: Examination moderately degraded by motion artifact. Diffuse straightening of the normal cervical lordosis. No listhesis. Vertebrae: Vertebral body height maintained without evidence for acute or chronic fracture. Bone marrow signal intensity diffusely decreased on T1 weighted imaging, nonspecific, but most commonly related to anemia, smoking, or obesity. No discrete or worrisome osseous lesions. Mild reactive endplate changes present about the C5-6 and C6-7 interspaces. No abnormal marrow edema. Cord: Evaluation of the cervical spinal cord markedly limited by motion artifact. Patchy signal abnormality within the central/dorsal aspect of the cord at the level of C2-3 (series 23, image 4). There is apparent patchy cord signal abnormality within the left posterior cord at the level of C3-4 (series 23, image 9). No other definite or convincing signal abnormality identified, although evaluation limited by motion. Overall cord caliber within  normal limits. Posterior Fossa, vertebral arteries, paraspinal tissues: Craniocervical junction  normal. Paraspinous and prevertebral soft tissues within normal limits. Normal intravascular flow voids seen within the vertebral arteries bilaterally. Disc levels: C2-C3: Central disc protrusion indents the ventral thecal sac. Mild spinal stenosis. Foramina remain patent. C3-C4: Central/right paracentral disc osteophyte indents the ventral thecal sac, contacting the ventral spinal cord. Mild spinal stenosis without significant cord deformity. Right greater than left uncovertebral hypertrophy with resultant mild right C4 foraminal narrowing. Left neural foramina widely patent. C4-C5: Mild diffuse disc bulge with bilateral uncovertebral hypertrophy, greater on the left. Mild spinal stenosis. Mild to moderate left C5 foraminal narrowing. No significant right foraminal encroachment. C5-C6: Mild diffuse disc bulge with bilateral uncovertebral hypertrophy. Mild spinal stenosis. Mild left greater than right C6 foraminal narrowing. C6-C7: Mild disc bulge. No significant canal or foraminal stenosis. C7-T1:  Unremarkable. Visualized upper thoracic spine demonstrates no significant finding. IMPRESSION: MRI HEAD IMPRESSION: 1. Technically limited exam due to extensive motion artifact. 2. Patchy and confluent T2/FLAIR signal abnormality involving the periventricular and deep white matter of both cerebral hemispheres as well as the pons. While a degree of chronic microvascular ischemic changes may be contributory, overall appearance is suspicious for possible underlying demyelinating disease, particularly given the findings in the cervical spine as below. No associated restricted diffusion to suggest active demyelination. 3. No other acute intracranial abnormality identified. MRI CERVICAL SPINE IMPRESSION: 1. Technically limited exam due to extensive motion artifact. 2. Patchy cord signal abnormality involving the upper cervical spinal cord at the level of C2-3 and C3-4 as above, suspicious for possible demyelinating disease. No  obvious cord expansion to suggest active demyelination, although evaluation limited on this motion degraded exam. 3. 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 Mild right C4 foraminal narrowing, with mild to moderate left C5 foraminal stenosis, and mild bilateral C7 foraminal narrowing. Electronically Signed   By: Rise MuBenjamin  McClintock M.D.   On: 06/16/2019 03:48    Procedures Procedures   Medications Ordered in ED Medications  sodium chloride flush (NS) 0.9 % injection 3 mL (3 mLs Intravenous Given 06/16/19 0212)  LORazepam (ATIVAN) injection 1 mg (1 mg Intravenous Given 06/16/19 0219)     Initial Impression / Assessment and Plan / ED Course  I have reviewed the triage vital signs and the nursing notes.  Pertinent labs  results that were available during my care of the patient were reviewed by me and considered in my medical decision making (see chart for details).        5:05 AM Patient presented for reported weakness in left arm for a week.  No focal weakness, but he did appear to lack coordination in left arm He perhaps had a mild dysarthria.  He has high risk for recurrent stroke, but due to symptoms only being in left arm, felt to be important MRI brain and C-spine. I discussed the MRI findings with the on-call neurologist Dr. Amada JupiterKirkpatrick.  He has reviewed the MRI, and he is not convinced that this represents a new demyelinating process.  There are no signs of stroke. Patient at some point may benefit for further MRI imaging with contrast, but no other acute findings. Patient can also follow-up with neurology   Patient rested in the ER for a while and felt improved.  He can ambulate. No changes noted in his neurologic exam. Discussed at length need for follow-up.  He may end up needing MRI with contrast in the  next few weeks.  Ambulatory referral given to neurology.  Also given information for PCP follow-up Advised  patient that he may in fact have MS but will need further testing. Final Clinical Impressions(s) / ED Diagnoses  We discussed strict return precautions.  All questions were answered. Final diagnoses:  Coordination abnormal  Left arm weakness    ED Discharge Orders    None       Zadie Rhine, MD 06/16/19 [phone]

## 2019-06-16 NOTE — ED Notes (Signed)
Patient transported to MRI 

## 2019-06-16 NOTE — ED Notes (Signed)
Patient ambulated down hall with minimal assistance. Gait steady.

## 2019-06-21 ENCOUNTER — Encounter: Payer: Self-pay | Admitting: Cardiovascular Disease

## 2019-06-21 ENCOUNTER — Ambulatory Visit (INDEPENDENT_AMBULATORY_CARE_PROVIDER_SITE_OTHER): Payer: Self-pay | Admitting: Cardiovascular Disease

## 2019-06-21 ENCOUNTER — Other Ambulatory Visit: Payer: Self-pay

## 2019-06-21 VITALS — BP 175/109 | HR 85 | Ht 68.0 in | Wt 312.0 lb

## 2019-06-21 DIAGNOSIS — I428 Other cardiomyopathies: Secondary | ICD-10-CM

## 2019-06-21 DIAGNOSIS — I10 Essential (primary) hypertension: Secondary | ICD-10-CM

## 2019-06-21 DIAGNOSIS — I5042 Chronic combined systolic (congestive) and diastolic (congestive) heart failure: Secondary | ICD-10-CM

## 2019-06-21 DIAGNOSIS — E785 Hyperlipidemia, unspecified: Secondary | ICD-10-CM

## 2019-06-21 NOTE — Patient Instructions (Signed)
Medication Instructions:  Restart Entresto 24/26. Use samples.  If you need a refill on your cardiac medications before your next appointment, please call your pharmacy.   Follow-Up: At [facility], you and your health needs are our priority.  As part of our continuing mission to provide you with exceptional heart care, we have created designated Provider Care Teams.  These Care Teams include your primary Cardiologist (physician) and Advanced Practice Providers (APPs -  Physician Assistants and Nurse Practitioners) who all work together to provide you with the care you need, when you need it. You will need a follow up appointment in 2 months.  You may see [name], MD or one of the following Advanced Practice Providers on your designated Care Team: [place], [place] . Fabian Sharp, PA-C  Any Other Special Instructions Will Be Listed Below (If Applicable). You will see PharmD on Thursday September 10th at 2:30 PM. They will do blood work.

## 2019-06-21 NOTE — Progress Notes (Signed)
Cardiology Office Note    Date:  06/27/2019   ID:  Nicholas Kelly., DOB June 26, 1966, MRN [id number]  PCP:  [name], FNP  Cardiologist:  [name], MD   Follow-up visit.  History of Present Illness:  Nicholas Kelly. is a 53 y.o. male who presents for a 3 month follow-up cardiology evaluation.   [name] has a history of hypertension, and presented to [facility] on 02/10/2017 from urgent care with acute onset of shortness of breath.  He was previously diagnosed with CHF in October 17 and had been on HCTZ but had run out of this prescription.  He was admitted by the hospitalist service.  A chest x-ray showed findings of CHF with pulmonary edema.  BNP was elevated at 752.  An echo Doppler study showed reduced LV function with an EF of 20-25% with moderate concentric LVH, diffuse hypokinesis, grade 3 diastolic dysfunction, mild mitral regurgitation, moderate tricuspid regurgitation, and moderate bony hypertension with a PA pressure 51 mm.  Patient was seen in cardiology consultation.  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  He did not have significant obstructive CAD with only diffuse 30-50% mid and distal RCA stenoses.  There is moderate pulmonary hypertension.  He was discharged on 02/14/2017 and has been on lisinopril 5 mg, furosemide 40 mg, carvedilol 6.25 mg twice a day in addition to aspirin 81 mg, supplemental potassium 20, now quit once and atorvastatin 40 mg.     I saw for initial post hospital evaluation on 03/11/2017.  I reviewed his hospital data in its entirety.  He was feeling improved on his initial medical regimen and I recommended further titration of his lisinopril up to 10 mg and increase carvedilol to 12.5 mg twice a day.  He did not have insurance and ws working on trying to obtain a Medicaid  card.  We talked in the future about possibly switching him to entresto.  On his increase regimen, he has felt improved.    When I saw him in June 2018 his blood pressure continued to be elevated and I recommended the addition of spironolactone at 12.5 mg for one week and then to increase this to 12.5 mg twice a day.  I also titrated lisinopril from 10 mg to 15 mg. On July 17, 2017 he presented to the hospital with complaints of diplopia and visual disturbance. 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  An MRI of the range, did not show acute abnormality but revealed chronic ischemic microangiopathy.  He had a negative CTA, except for minimal calcified atherosclerosis involving only the carotids.  During his hospitalization.  A repeat echo Doppler study showed an EF of 30% with grade 1 diastolic dysfunction.  His aortic root was mildly dilated.  Neurology was consulted and he was felt to have had a likely CVA given his reduced LV function.  He underwent outpatient neurologic rehab.  When I saw him in October 2018 blood pressure was still elevated and I further titrated lisinopril to 20 mg.  He was also taking Spironolactone 12.5 mg twice a day, furosemide 40 mg daily, carvedilol 12.5 twice daily in addition to atorvastatin 80 mg and Plavix and aspirin.  When I  saw him in April 2019 he had run out  of his lisinopril for the last 2 months.  His allergist had increase aspirin to 325 and he had continued to be on Plavix, carvedilol 12.5 twice a day, furosemide 40 mg and HCTZ 25 mg.  He had lost 35 pounds since October 2018.  He was no longer taking Spironolactone.  With his reduced EF I felt he was a good candidate for initiation of Entresto.  He was gradually been titrated up to maximum dose 97/103 and  saw [name] off [place] in May 2019 at which time the dose was titrated to maximum.  Apparently he took that dose for a month  since he had a 30-day free samples but unfortunately when [facility] the [facility] were unable to fill it since he had no insurance.  When I last saw him on July 26, 2018 he was restarted with samples of and on Entresto at 49/51 mg.  At the time he also was on carvedilol 12.5 mg twice a day, furosemide 60 mg daily without edema.  He was on atorvastatin 80 mg for hyperlipidemia.    I last saw him in January 2020 and over the past several months prior to that evaluation he  felt improved with reinitiation of Entresto therapy.  At times he still noted some abdominal bloating.  He was  on carvedilol 12.5 twice a day, clonidine 0.1 mg twice a day, furosemide 60 mg daily and apparently HCTZ 25 mg.  During that evaluation, he had no signs of edema and appears euvolemic on exam.  So that I could further titrate Entresto back to maximum dosing, I suggested he discontinue HCTZ and also suggested he decrease Lasix.  For several weeks I also recommended slight additional titration of carvedilol.  Presently, currently he stopped taking Entresto when his samples ran out at least 3 months ago.  As result he has been off treatment and notices significant occurrence of some shortness of breath that he had had experienced before.  He presents for evaluation.   Past Medical History:  Diagnosis Date  . Acute CHF (congestive heart failure) ([place]) 02/10/2017  . Dyspnea   . Hypertension   . Stroke [facility])     Past Surgical History:  Procedure Laterality Date  . NO PAST SURGERIES    . RIGHT/LEFT HEART CATH AND CORONARY ANGIOGRAPHY N/A 02/12/2017   Procedure: Right/Left Heart Cath and Coronary Angiography;  Surgeon: Belva Crome, MD;  Location: [place] [facility];  Service: Cardiovascular;  Laterality: N/A;    Current Medications: Outpatient Medications Prior to Visit  Medication Sig Dispense Refill  . aspirin 81 MG tablet Take 1 tablet (81 mg total) by mouth daily. 30 tablet 3  .  atorvastatin (LIPITOR) 80 MG tablet Take 1 tablet (80 mg total) by mouth daily at 6 PM. 90 tablet 3  . carvedilol (COREG) 12.5 MG tablet Take 1 tablet (12.5 mg total) by mouth 2 (two) times daily with a meal. 180 tablet 3  . cloNIDine (CATAPRES) 0.1 MG tablet TAKE 1 TABLET (0.1 MG TOTAL) BY MOUTH 2 (TWO) TIMES DAILY. 180 tablet 1  . furosemide (LASIX) 40 MG tablet Take 1 tablet (40 mg total) by mouth daily. (Patient taking differently: Take 60 mg by mouth daily. ) 90 tablet 1  . Potassium Chloride ER 20 MEQ TBCR TAKE 1 TABLET BY MOUTH ONCE DAILY (Patient taking differently: Take 20 mEq by mouth daily. ) 180 tablet 0  . sacubitril-valsartan (ENTRESTO) 97-103 MG Take 1 tablet by mouth 2 (  two) times daily. (Patient not taking: Reported on 06/21/2019) 60 tablet 3   No facility-administered medications prior to visit.      Allergies:   Patient has no known allergies.   Social History   Socioeconomic History  . Marital status: Divorced    Spouse name: Not on file  . Number of children: Not on file  . Years of education: Not on file  . Highest education level: Not on file  Occupational History  . Not on file  Social Needs  . Financial resource strain: Not on file  . Food insecurity    Worry: Not on file    Inability: Not on file  . Transportation needs    Medical: Not on file    Non-medical: Not on file  Tobacco Use  . Smoking status: Former Smoker    Packs/day: 1.00    Years: 34.00    Pack years: 34.00    Types: Cigarettes    Quit date: 06/28/2016    Years since quitting: 2.9  . Smokeless tobacco: Never Used  Substance and Sexual Activity  . Alcohol use: Yes    Alcohol/week: 8.0 standard drinks    Types: 8 Shots of liquor per week    Comment: occssionally  . Drug use: Yes    Comment: 02/10/2017 "experimented w/drugs; haven't done any for years"  . Sexual activity: Not Currently  Lifestyle  . Physical activity    Days per week: Not on file    Minutes per session: Not on file   . Stress: Not on file  Relationships  . Social Herbalist on phone: Not on file    Gets together: Not on file    Attends religious service: Not on file    Active member of club or organization: Not on file    Attends meetings of clubs or organizations: Not on file    Relationship status: Not on file  Other Topics Concern  . Not on file  Social History Narrative  . Not on file     Family History:  The patient's family history includes Cardiomyopathy in his mother; Hypertension in his mother; Stroke in his maternal aunt.   ROS General: Negative; No fevers, chills, or night sweats;  HEENT: positive for recent visual disturbance. sinus congestion, difficulty swallowing Pulmonary: Negative; No cough, wheezing, shortness of breath, hemoptysis Cardiovascular: See HPI GI: Negative; No nausea, vomiting, diarrhea, or abdominal pain GU: Negative; No dysuria, hematuria, or difficulty voiding Musculoskeletal: Negative; no myalgias, joint pain, or weakness Hematologic/Oncology: Negative; no easy bruising, bleeding Endocrine: Negative; no heat/cold intolerance; no diabetes Neuro: positive for previous diplopia Skin: Negative; No rashes or skin lesions Psychiatric: Negative; No behavioral problems, depression Sleep: Negative; No snoring, daytime sleepiness, hypersomnolence, bruxism, restless legs, hypnogognic hallucinations, no cataplexy Other comprehensive 14 point system review is negative.   PHYSICAL EXAM:   VS:  BP (!) 175/109   Pulse 85   Ht '5\' 8"'  (1.727 m)   Wt (!) 312 lb (141.5 kg)   BMI 47.44 kg/m     Repeat blood pressure by me was 134/84  Wt Readings from Last 3 Encounters:  06/21/19 (!) 312 lb (141.5 kg)  06/16/19 300 lb (136.1 kg)  11/22/18 (!) 300 lb 12.8 oz (136.4 kg)      Physical Exam BP (!) 175/109   Pulse 85   Ht '5\' 8"'  (1.727 m)   Wt (!) 312 lb (141.5 kg)   BMI 47.44 kg/m  General: Alert, oriented,  no distress.  Skin: normal turgor, no rashes,  warm and dry HEENT: Normocephalic, atraumatic. Pupils equal round and reactive to light; sclera anicteric; extraocular muscles intact; Fundi ** Nose without nasal septal hypertrophy Mouth/Parynx benign; Mallinpatti scale 3 Neck: No JVD, no carotid bruits; normal carotid upstroke Lungs: clear to ausculatation and percussion; no wheezing or rales Chest wall: without tenderness to palpitation Heart: PMI not displaced, RRR, s1 s2 normal, 1/6 systolic murmur, no diastolic murmur, no rubs, gallops, thrills, or heaves Abdomen: soft, nontender; no hepatosplenomehaly, BS+; abdominal aorta nontender and not dilated by palpation. Back: no CVA tenderness Pulses 2+ Musculoskeletal: full range of motion, normal strength, no joint deformities Extremities: no clubbing cyanosis or edema, Homan's sign negative  Neurologic: grossly nonfocal; Cranial nerves grossly wnl Psychologic: Normal mood and affect   Studies/Labs Reviewed:   June 15, 2019 ECG (independently read by me): NSR at 64; RBBB  November 22, 2018 ECG (independently read by me): Sinus rhythm with occasional PACs as well as PVC.  Left axis deviation.  Right bundle branch block.  LVH by voltage.  July 26, 2018 ECG (independently read by me): Sinus bradycardia at 53 bpm.  Right bundle branch block. LVH by voltage; normal intervals  February 20, 2018 ECG (independently read by me): Normal sinus rhythm at 74 bpm.  Left axis deviation.  Right bundle branch block.  LVH by voltage criteria.  QTc interval 492 ms.  August 18 2017 ECG (independently read by me): Normal sinus rhythm at 66 bpm.  Right bundle branch block with repolarization changes.  Inferolateral T change.  T-wave changes.  LVH.  QTc interval 461 ms.  04/21/2017 EKG:  EKG is ordered today. Normal sinus rhythm at 77 bpm.  LVH with repolarization changes.  PR interval 184 ms.  QTc interval 488 ms.  03/11/2017 ECG (independently read by me): Normal sinus rhythm at 73 bpm.  Left axis  deviation.  LVH by voltage criteria with repolarization changes.  Recent Labs: BMP Latest Ref Rng & Units 06/15/2019 06/15/2019 09/20/2018  Glucose 70 - 99 mg/dL 90 94 87  BUN 6 - 20 mg/dL 26(H) 24(H) 10  Creatinine 0.61 - 1.24 mg/dL 1.20 1.16 1.00  BUN/Creat Ratio 9 - 20 - - 10  Sodium 135 - 145 mmol/L 141 140 141  Potassium 3.5 - 5.1 mmol/L 4.3 4.4 4.5  Chloride 98 - 111 mmol/L 105 105 102  CO2 22 - 32 mmol/L - 25 22  Calcium 8.9 - 10.3 mg/dL - 9.0 9.2     Hepatic Function Latest Ref Rng & Units 06/15/2019 09/20/2018 02/22/2018  Total Protein 6.5 - 8.1 g/dL 7.5 8.0 7.1  Albumin 3.5 - 5.0 g/dL 3.6 4.4 3.9  AST 15 - 41 U/L '20 22 18  ' ALT 0 - 44 U/L 18 33 15  Alk Phosphatase 38 - 126 U/L 61 79 63  Total Bilirubin 0.3 - 1.2 mg/dL 0.7 0.6 0.4  Bilirubin, Direct 0.1 - 0.5 mg/dL - - -    CBC Latest Ref Rng & Units 06/15/2019 06/15/2019 09/20/2018  WBC 4.0 - 10.5 K/uL - 9.5 8.7  Hemoglobin 13.0 - 17.0 g/dL 14.3 13.7 13.7  Hematocrit 39.0 - 52.0 % 42.0 43.4 40.6  Platelets 150 - 400 K/uL - 273 304   Lab Results  Component Value Date   MCV 100.7 (H) 06/15/2019   MCV 97 09/20/2018   MCV 98.2 07/17/2017   Lab Results  Component Value Date   TSH 2.520 09/20/2018   Lab Results  Component  Value Date   HGBA1C 5.0 07/18/2017     BNP    Component Value Date/Time   BNP 36.8 04/04/2018 0923   BNP 752.4 (H) 02/10/2017 1525    ProBNP    Component Value Date/Time   PROBNP 77 02/22/2018 1511     Lipid Panel     Component Value Date/Time   CHOL 195 09/20/2018 0941   TRIG 120 09/20/2018 0941   HDL 37 (L) 09/20/2018 0941   CHOLHDL 5.3 (H) 09/20/2018 0941   CHOLHDL 4.9 07/18/2017 0558   VLDL 11 07/18/2017 0558   LDLCALC 134 (H) 09/20/2018 0941     RADIOLOGY: Ct Head Wo Contrast  Result Date: 06/15/2019 CLINICAL DATA:  53 year old male with acute LEFT arm weakness for 6 days. EXAM: CT HEAD WITHOUT CONTRAST TECHNIQUE: Contiguous axial images were obtained from the base of  the skull through the vertex without intravenous contrast. COMPARISON:  07/18/2017 CT and prior studies FINDINGS: Brain: No evidence of acute infarction, hemorrhage, hydrocephalus, extra-axial collection or mass lesion/mass effect. Atrophy and chronic small-vessel white matter ischemic changes again noted. Vascular: Carotid atherosclerotic calcifications again noted. Skull: Negative for fracture or focal lesion. Sinuses/Orbits: No acute finding. Other: Multiple dental caries and periapical abscesses are noted. IMPRESSION: 1. No evidence of acute intracranial abnormality. 2. Atrophy and chronic small-vessel white matter ischemic changes 3. Multiple dental caries and periapical abscesses. Electronically Signed   By: Margarette Canada M.D.   On: 06/15/2019 19:50   Mr Brain Wo Contrast  Result Date: 06/16/2019 CLINICAL DATA:  Initial evaluation for acute left arm weakness for several days. EXAM: MRI HEAD WITHOUT CONTRAST MRI CERVICAL SPINE WITHOUT CONTRAST TECHNIQUE: Multiplanar, multiecho pulse sequences of the brain and surrounding structures, and cervical spine, to include the craniocervical junction and cervicothoracic junction, were obtained without intravenous contrast. COMPARISON:  Comparison made with prior CT from 06/15/2019. FINDINGS: MRI HEAD FINDINGS Brain: Examination moderately degraded by motion artifact. Cerebral volume within normal limits for age. Patchy and confluent T2/FLAIR hyperintensity within the periventricular and deep white matter both cerebral hemispheres present, nonspecific, but could reflect sequelae of underlying demyelinating disease. Patchy involvement of the pons. Few scattered superimposed remote lacunar infarcts noted. No abnormal foci of restricted diffusion to suggest acute or subacute ischemia. Gray-white matter differentiation maintained. No appreciable areas of remote cortical infarction. No foci of susceptibility artifact to suggest acute or chronic intracranial hemorrhage. No  mass lesion, midline shift or mass effect. No hydrocephalus. No extra-axial fluid collection. Pituitary gland within normal limits. Vascular: Major intracranial vascular flow voids maintained. Skull and upper cervical spine: Craniocervical junction within normal limits. Bone marrow signal intensity normal. Small lipoma noted at the left frontal scalp. Sinuses/Orbits: Globes and orbital soft tissues within normal limits. Paranasal sinuses are largely clear. Small left mastoid effusion noted, of doubtful significance. Inner ear structures grossly normal. Other: None. MRI CERVICAL SPINE FINDINGS Alignment: Examination moderately degraded by motion artifact. Diffuse straightening of the normal cervical lordosis. No listhesis. Vertebrae: Vertebral body height maintained without evidence for acute or chronic fracture. Bone marrow signal intensity diffusely decreased on T1 weighted imaging, nonspecific, but most commonly related to anemia, smoking, or obesity. No discrete or worrisome osseous lesions. Mild reactive endplate changes present about the C5-6 and C6-7 interspaces. No abnormal marrow edema. Cord: Evaluation of the cervical spinal cord markedly limited by motion artifact. Patchy signal abnormality within the central/dorsal aspect of the cord at the level of C2-3 (series 23, image 4). There is apparent patchy cord signal abnormality  within the left posterior cord at the level of C3-4 (series 23, image 9). No other definite or convincing signal abnormality identified, although evaluation limited by motion. Overall cord caliber within normal limits. Posterior Fossa, vertebral arteries, paraspinal tissues: Craniocervical junction normal. Paraspinous and prevertebral soft tissues within normal limits. Normal intravascular flow voids seen within the vertebral arteries bilaterally. Disc levels: C2-C3: Central disc protrusion indents the ventral thecal sac. Mild spinal stenosis. Foramina remain patent. C3-C4:  Central/right paracentral disc osteophyte indents the ventral thecal sac, contacting the ventral spinal cord. Mild spinal stenosis without significant cord deformity. Right greater than left uncovertebral hypertrophy with resultant mild right C4 foraminal narrowing. Left neural foramina widely patent. C4-C5: Mild diffuse disc bulge with bilateral uncovertebral hypertrophy, greater on the left. Mild spinal stenosis. Mild to moderate left C5 foraminal narrowing. No significant right foraminal encroachment. C5-C6: Mild diffuse disc bulge with bilateral uncovertebral hypertrophy. Mild spinal stenosis. Mild left greater than right C6 foraminal narrowing. C6-C7: Mild disc bulge. No significant canal or foraminal stenosis. C7-T1:  Unremarkable. Visualized upper thoracic spine demonstrates no significant finding. IMPRESSION: MRI HEAD IMPRESSION: 1. Technically limited exam due to extensive motion artifact. 2. Patchy and confluent T2/FLAIR signal abnormality involving the periventricular and deep white matter of both cerebral hemispheres as well as the pons. While a degree of chronic microvascular ischemic changes may be contributory, overall appearance is suspicious for possible underlying demyelinating disease, particularly given the findings in the cervical spine as below. No associated restricted diffusion to suggest active demyelination. 3. No other acute intracranial abnormality identified. MRI CERVICAL SPINE IMPRESSION: 1. Technically limited exam due to extensive motion artifact. 2. Patchy cord signal abnormality involving the upper cervical spinal cord at the level of C2-3 and C3-4 as above, suspicious for possible demyelinating disease. No obvious cord expansion to suggest active demyelination, although evaluation limited on this motion degraded exam. 3. 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 Mild right C4 foraminal narrowing, with mild to  moderate left C5 foraminal stenosis, and mild bilateral C7 foraminal narrowing. Electronically Signed   By: Jeannine Boga M.D.   On: 06/16/2019 03:48   Mr Cervical Spine Wo Contrast  Result Date: 06/16/2019 CLINICAL DATA:  Initial evaluation for acute left arm weakness for several days. EXAM: MRI HEAD WITHOUT CONTRAST MRI CERVICAL SPINE WITHOUT CONTRAST TECHNIQUE: Multiplanar, multiecho pulse sequences of the brain and surrounding structures, and cervical spine, to include the craniocervical junction and cervicothoracic junction, were obtained without intravenous contrast. COMPARISON:  Comparison made with prior CT from 06/15/2019. FINDINGS: MRI HEAD FINDINGS Brain: Examination moderately degraded by motion artifact. Cerebral volume within normal limits for age. Patchy and confluent T2/FLAIR hyperintensity within the periventricular and deep white matter both cerebral hemispheres present, nonspecific, but could reflect sequelae of underlying demyelinating disease. Patchy involvement of the pons. Few scattered superimposed remote lacunar infarcts noted. No abnormal foci of restricted diffusion to suggest acute or subacute ischemia. Gray-white matter differentiation maintained. No appreciable areas of remote cortical infarction. No foci of susceptibility artifact to suggest acute or chronic intracranial hemorrhage. No mass lesion, midline shift or mass effect. No hydrocephalus. No extra-axial fluid collection. Pituitary gland within normal limits. Vascular: Major intracranial vascular flow voids maintained. Skull and upper cervical spine: Craniocervical junction within normal limits. Bone marrow signal intensity normal. Small lipoma noted at the left frontal scalp. Sinuses/Orbits: Globes and orbital soft tissues within normal limits. Paranasal sinuses are largely clear. Small left mastoid effusion noted,  of doubtful significance. Inner ear structures grossly normal. Other: None. MRI CERVICAL SPINE FINDINGS  Alignment: Examination moderately degraded by motion artifact. Diffuse straightening of the normal cervical lordosis. No listhesis. Vertebrae: Vertebral body height maintained without evidence for acute or chronic fracture. Bone marrow signal intensity diffusely decreased on T1 weighted imaging, nonspecific, but most commonly related to anemia, smoking, or obesity. No discrete or worrisome osseous lesions. Mild reactive endplate changes present about the C5-6 and C6-7 interspaces. No abnormal marrow edema. Cord: Evaluation of the cervical spinal cord markedly limited by motion artifact. Patchy signal abnormality within the central/dorsal aspect of the cord at the level of C2-3 (series 23, image 4). There is apparent patchy cord signal abnormality within the left posterior cord at the level of C3-4 (series 23, image 9). No other definite or convincing signal abnormality identified, although evaluation limited by motion. Overall cord caliber within normal limits. Posterior Fossa, vertebral arteries, paraspinal tissues: Craniocervical junction normal. Paraspinous and prevertebral soft tissues within normal limits. Normal intravascular flow voids seen within the vertebral arteries bilaterally. Disc levels: C2-C3: Central disc protrusion indents the ventral thecal sac. Mild spinal stenosis. Foramina remain patent. C3-C4: Central/right paracentral disc osteophyte indents the ventral thecal sac, contacting the ventral spinal cord. Mild spinal stenosis without significant cord deformity. Right greater than left uncovertebral hypertrophy with resultant mild right C4 foraminal narrowing. Left neural foramina widely patent. C4-C5: Mild diffuse disc bulge with bilateral uncovertebral hypertrophy, greater on the left. Mild spinal stenosis. Mild to moderate left C5 foraminal narrowing. No significant right foraminal encroachment. C5-C6: Mild diffuse disc bulge with bilateral uncovertebral hypertrophy. Mild spinal stenosis. Mild  left greater than right C6 foraminal narrowing. C6-C7: Mild disc bulge. No significant canal or foraminal stenosis. C7-T1:  Unremarkable. Visualized upper thoracic spine demonstrates no significant finding. IMPRESSION: MRI HEAD IMPRESSION: 1. Technically limited exam due to extensive motion artifact. 2. Patchy and confluent T2/FLAIR signal abnormality involving the periventricular and deep white matter of both cerebral hemispheres as well as the pons. While a degree of chronic microvascular ischemic changes may be contributory, overall appearance is suspicious for possible underlying demyelinating disease, particularly given the findings in the cervical spine as below. No associated restricted diffusion to suggest active demyelination. 3. No other acute intracranial abnormality identified. MRI CERVICAL SPINE IMPRESSION: 1. Technically limited exam due to extensive motion artifact. 2. Patchy cord signal abnormality involving the upper cervical spinal cord at the level of C2-3 and C3-4 as above, suspicious for possible demyelinating disease. No obvious cord expansion to suggest active demyelination, although evaluation limited on this motion degraded exam. 3. 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 Mild right C4 foraminal narrowing, with mild to moderate left C5 foraminal stenosis, and mild bilateral C7 foraminal narrowing. Electronically Signed   By: Jeannine Boga M.D.   On: 06/16/2019 03:48     Additional studies/ records that were reviewed today include:  I reviewed the patient's recent hospitalization, his cardiac consultation, cardiac catheterization report, and hospitalist records. I reviewed his hospitalization from September 22 through 07/19/2017 including imaging studies and echocardiogram.   ASSESSMENT:    1. Nonischemic cardiomyopathy ([place])   2. Chronic combined systolic and diastolic CHF, NYHA class 2 ([place])   3.  Essential hypertension   4. Hyperlipidemia, unspecified hyperlipidemia type   5. Morbid obesity ([place])     PLAN:  [name] is a 53 year-old African-American gentleman who has a history of morbid obesity and  has documented reduced EF noted in April 2018.  He has been an excellent candidate for [facility] and previously had been uptitrated to 97/103 twice daily with improvement but unfortunately had stopped therapy when he could not get additional medication.  He  was able to be restarted back on Entresto and earlier this year, he was titrated to 49/51 and later 97/103 mm successfully.  Fortunately, again he stopped using Entresto when his samples ran out which he believes may have been sometime in March or April 2020.  Pressure initially today was significantly elevated but improved on recheck by me at 134/84.  I am providing him with samples of Entresto 24/26 mg for him to take twice a day for the next 2 weeks.  I will then give him another prescription for 49/51 mg to take twice a day.  He will see our pharmacist in 2 to 3 weeks to make certain he is tolerating reinitiation of Entresto and additional medicines and samples hopefully can be provided.  We will try to do whatever is necessary for him to get approval for his medications.  He continues to be on carvedilol 12.5 mg twice a day and furosemide.  He continues to be on atorvastatin 80 mg and is tolerating this well.  He had recently developed arm weakness leading to an ER evaluation on June 15, 2019.  CT and MRI findings were reviewed.  I will see him in 2 months for cardiology follow-up evaluation.   Medication Adjustments/Labs and Tests Ordered: Current medicines are reviewed at length with the patient today.  Concerns regarding medicines are outlined above.  Medication changes, Labs and Tests ordered today are listed in the Patientt Instructions below. Patient Instructions  Medication Instructions:  Restart Entresto 24/26. Use  samples.  If you need a refill on your cardiac medications before your next appointment, please call your pharmacy.   Follow-Up: At [facility], you and your health needs are our priority.  As part of our continuing mission to provide you with exceptional heart care, we have created designated Provider Care Teams.  These Care Teams include your primary Cardiologist (physician) and Advanced Practice Providers (APPs -  Physician Assistants and Nurse Practitioners) who all work together to provide you with the care you need, when you need it. You will need a follow up appointment in 2 months.  You may see [name], MD or one of the following Advanced Practice Providers on your designated Care Team: [place], [place] . Fabian Sharp, PA-C  Any Other Special Instructions Will Be Listed Below (If Applicable). You will see PharmD on Thursday September 10th at 2:30 PM. They will do blood work.      Signed, [name], MD , [facility] 06/27/2019 7:35 PM    [facility] [address], [place], [place], [place]  [id number] Phone: [phone]

## 2019-06-27 ENCOUNTER — Encounter: Payer: Self-pay | Admitting: Cardiovascular Disease

## 2019-07-06 ENCOUNTER — Other Ambulatory Visit: Payer: Self-pay

## 2019-07-06 ENCOUNTER — Ambulatory Visit (INDEPENDENT_AMBULATORY_CARE_PROVIDER_SITE_OTHER): Payer: Self-pay | Admitting: Pharmacist

## 2019-07-06 VITALS — BP 142/90 | HR 68 | Resp 15 | Ht 68.0 in | Wt 311.6 lb

## 2019-07-06 DIAGNOSIS — I5042 Chronic combined systolic (congestive) and diastolic (congestive) heart failure: Secondary | ICD-10-CM

## 2019-07-06 DIAGNOSIS — I428 Other cardiomyopathies: Secondary | ICD-10-CM

## 2019-07-06 NOTE — Progress Notes (Signed)
NEUROLOGY CONSULTATION NOTE  [name] MRN: [id number] DOB: 08-21-1966  Referring provider: Zadie Rhine, MD (ED referral) Primary care provider: Joaquin Courts, FNP  Reason for consult:  Left arm weakness  HISTORY OF PRESENT ILLNESS: Nicholas Kelly is a 53 year old left-handed black man with non-ischemic cardiomyopathy, hypertension and history of stroke who presents for left arm weakness.  History supplemented by ED note.  CT and MRI scans cited below were personally reviewed.   In 2007, he was working at [employer] and he tripped over something and fell forward landing on his arms.  After he fell, his arms were contracted.  He was told it was due to trauma and symptoms resolved after a month.  He was admitted to [facility] on 07/17/17 for diplopia and disequilibrium.  Exam revealed disconjugate gaze, with bilateral horizontal gaze palsy with inward deviation.  CT of head showed old right parietal infarct.  MRI of brain showed encephalomalacia in the bilateral pons and right parietal lobe but no acute findings.  Findings in the pons was determined to represent the cause of symptoms, now chronic due to late presentation.  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    In August, he woke up with left arm weakness and arm and hand were slight contracted.  He also noticed that his speech was not normal.  He noted some mild numbness in the left arm as well.  No associated headache, visual disturbance.  He presented to the ED about a week later on 06/15/19 for further evaluation.  CT of brain showed atrophy and chronic small vessel ischemic changes but no acute intracranial abnormality.   MRI of brain without contrast, which was limited due to motion artifact, showed pathy confluent T2/FLAIR signal abnormality involving the periventricular cerebral white matter and pons.  Concern that this may represent demyelinating disease.  No associated  restricted diffusion to suggest active demyelination.  MRI of cervical spine without contrast, also limited due to motion artifact, showed patchy cord signal abnormality involving the upper cervical spinal cord at level of C2-3 and C3-4 with no obvious cord expansion to suggest active demyelinating disease.  Also demonstrated, multilevel cervical spondylosis with diffuse mild spinal stenosis at C2-C3 through C5-C6, mild right C4 foraminal narrowing and mild to moderate left C5 foraminal narrowing and mild bilateral C7 foraminal narrowing.  Neurologic exam in the ED revealed questionable mild dysarthria but otherwise no focal or lateralizing findings.  MRI was reviewed by the neurohospitalist who questioned that findings represented a new demyelinating process.  He was discharged with outpatient neurology follow up.   Her sister, who has passed away, had multiple sclerosis.  PAST MEDICAL HISTORY: Past Medical History:  Diagnosis Date   Acute CHF (congestive heart failure) (HCC) 02/10/2017   Dyspnea    Hypertension    Stroke (H[facility])     PAST SURGICAL HISTORY: Past Surgical History:  Procedure Laterality Date   NO PAST SURGERIES     RIGHT/LEFT HEART CATH AND CORONARY ANGIOGRAPHY N/A 02/12/2017   Procedure: Right/Left Heart Cath and Coronary Angiography;  Surgeon: Lyn Records, MD;  Location: [facility] INVASIVE CV LAB;  Service: Cardiovascular;  Laterality: N/A;    MEDICATIONS: Current Outpatient Medications on File Prior to Visit  Medication Sig Dispense Refill   aspirin 81 MG tablet Take 1 tablet (81 mg total) by mouth daily. 30 tablet 3   atorvastatin (LIPITOR) 80 MG tablet Take 1 tablet (80 mg total)  by mouth daily at 6 PM. 90 tablet 3   carvedilol (COREG) 12.5 MG tablet Take 1 tablet (12.5 mg total) by mouth 2 (two) times daily with a meal. 180 tablet 3   cloNIDine (CATAPRES) 0.1 MG tablet TAKE 1 TABLET (0.1 MG TOTAL) BY MOUTH 2 (TWO) TIMES DAILY. 180 tablet 1   furosemide (LASIX) 40  MG tablet Take 1 tablet (40 mg total) by mouth daily. (Patient taking differently: Take 60 mg by mouth daily. ) 90 tablet 1   Potassium Chloride ER 20 MEQ TBCR TAKE 1 TABLET BY MOUTH ONCE DAILY (Patient taking differently: Take 20 mEq by mouth daily. ) 180 tablet 0   sacubitril-valsartan (ENTRESTO) 97-103 MG Take 1 tablet by mouth 2 (two) times daily. (Patient not taking: Reported on 06/21/2019) 60 tablet 3   No current facility-administered medications on file prior to visit.     ALLERGIES: No Known Allergies  FAMILY HISTORY: Family History  Problem Relation Age of Onset   Hypertension Mother    Cardiomyopathy Mother    Stroke Maternal Aunt     SOCIAL HISTORY: Social History   Socioeconomic History   Marital status: Divorced    Spouse name: Not on file   Number of children: Not on file   Years of education: Not on file   Highest education level: Not on file  Occupational History   Not on file  Social Needs   Financial resource strain: Not on file   Food insecurity    Worry: Not on file    Inability: Not on file   Transportation needs    Medical: Not on file    Non-medical: Not on file  Tobacco Use   Smoking status: Former Smoker    Packs/day: 1.00    Years: 34.00    Pack years: 34.00    Types: Cigarettes    Quit date: 06/28/2016    Years since quitting: 3.0   Smokeless tobacco: Never Used  Substance and Sexual Activity   Alcohol use: Yes    Alcohol/week: 8.0 standard drinks    Types: 8 Shots of liquor per week    Comment: occssionally   Drug use: Yes    Comment: 02/10/2017 "experimented w/drugs; haven't done any for years"   Sexual activity: Not Currently  Lifestyle   Physical activity    Days per week: Not on file    Minutes per session: Not on file   Stress: Not on file  Relationships   Social connections    Talks on phone: Not on file    Gets together: Not on file    Attends religious service: Not on file    Active member of club or  organization: Not on file    Attends meetings of clubs or organizations: Not on file    Relationship status: Not on file   Intimate partner violence    Fear of current or ex partner: Not on file    Emotionally abused: Not on file    Physically abused: Not on file    Forced sexual activity: Not on file  Other Topics Concern   Not on file  Social History Narrative   Not on file    REVIEW OF SYSTEMS: Constitutional: No fevers, chills, or sweats, no generalized fatigue, change in appetite Eyes: No visual changes, double vision, eye pain Ear, nose and throat: No hearing loss, ear pain, nasal congestion, sore throat Cardiovascular: No chest pain, palpitations Respiratory:  No shortness of breath at rest or with  exertion, wheezes GastrointestinaI: No nausea, vomiting, diarrhea, abdominal pain, fecal incontinence Genitourinary:  No dysuria, urinary retention or frequency Musculoskeletal:  No neck pain, back pain Integumentary: No rash, pruritus, skin lesions Neurological: as above Psychiatric: No depression, insomnia, anxiety Endocrine: No palpitations, fatigue, diaphoresis, mood swings, change in appetite, change in weight, increased thirst Hematologic/Lymphatic:  No purpura, petechiae. Allergic/Immunologic: no itchy/runny eyes, nasal congestion, recent allergic reactions, rashes  PHYSICAL EXAM: Blood pressure (!) 152/106, pulse 76, temperature 99.2 F (37.3 C), height 5\' 8"  (1.727 m), weight (!) 309 lb (140.2 kg), SpO2 97 %. General: No acute distress.  Patient appears well-groomed.   Head:  Normocephalic/atraumatic Eyes:  fundi examined but not visualized Neck: supple, no paraspinal tenderness, full range of motion Back: No paraspinal tenderness Heart: regular rate and rhythm Lungs: Clear to auscultation bilaterally. Vascular: No carotid bruits. Neurological Exam: Mental status: alert and oriented to person, place, and time, recent and remote memory intact, fund of knowledge  intact, attention and concentration intact, speech fluent and not dysarthric, language intact. Cranial nerves: CN I: not tested CN II: pupils equal, round and reactive to light, visual fields intact CN III, IV, VI:  full range of motion, no nystagmus, no ptosis CN V: facial sensation intact CN VII: upper and lower face symmetric CN VIII: hearing intact CN IX, X: gag intact, uvula midline CN XI: sternocleidomastoid and trapezius muscles intact CN XII: tongue midline Bulk & Tone: normal, no fasciculations. Motor:  Mildly reduced finger-thumb tapping speed on left; 5/5 throughout Sensation:  Pinprick and vibration sensation intact. Deep Tendon Reflexes:  2+ throughout, toes downgoing. Finger to nose testing:  Without dysmetria.   Heel to shin:  Without dysmetria.   Gait:  Mildly wide-based gait.  Able to turn.  Romberg negative.  IMPRESSION: 1.  Left arm/hand weakness, questionable multiple sclerosis.  MRI of brain showed white matter abnormality that may represent demyelinating disease or small vessel ischemic changes.  There does appear to be abnormal signal on cervical MRI, but limited due to motion artifact.  Multiple sclerosis does run in his family.  PLAN: 1.  Repeat MRI of brain and cervical spine, without AND WITH contrast. 2.  After MRI, will order lumbar puncture, assessing CSF for cell count with diff, protein, glucose, oligoclonal bands, IgG index, myelin basic protein, gram stain and culture, and cytology. 3.  Follow up after testing  Thank you for allowing me to take part in the care of this patient.  Shon Millet, DO  CC:  Joaquin Courts, FNP

## 2019-07-06 NOTE — Patient Instructions (Addendum)
Return for a  follow up appointment in 3 weeks   Check your blood pressure at home daily (if able) and keep record of the readings.  Take your BP meds as follows: INCREASE ENTRESTO TO 49/51MG  (ONCE 24/26MG  COMPLETED)  CONTACT [facility] [phone] FOR WEIGHT LOSS AND DIETITIAN *  *INCREASE LAXIS (FUROSEMIDE) TO 80MG  FOR 2 DAYS ONLY, THEN BACK TO 60MG  DAILY*   Bring all of your meds, your BP cuff and your record of home blood pressures to your next appointment.  Exercise as you're able, try to walk approximately 30 minutes per day.  Keep salt intake to a minimum, especially watch canned and prepared boxed foods.  Eat more fresh fruits and vegetables and fewer canned items.  Avoid eating in fast food restaurants.    HOW TO TAKE YOUR BLOOD PRESSURE: . Rest 5 minutes before taking your blood pressure. .  Don't smoke or drink caffeinated beverages for at least 30 minutes before. . Take your blood pressure before (not after) you eat. . Sit comfortably with your back supported and both feet on the floor (don't cross your legs). . Elevate your arm to heart level on a table or a desk. . Use the proper sized cuff. It should fit smoothly and snugly around your bare upper arm. There should be enough room to slip a fingertip under the cuff. The bottom edge of the cuff should be 1 inch above the crease of the elbow. . Ideally, take 3 measurements at one sitting and record the average.

## 2019-07-06 NOTE — Progress Notes (Signed)
Patient ID: Nicholas Kelly.                 DOB: 1966/08/20                      MRN: [id number]     HPI: Nicholas Kelly. is a 53 y.o. male referred by Dr. Claiborne Billings to pharmacist clinic for medication titration. PHI includes heart failure with baseline EF 30%, uncontrolled hypertension, stroke, tobacco abuse, and hyperlipidemia.   Patient reports compliance with all medication and denies dizziness, SOB, increased fatigue or blurry vision. Noted decreased appetite and fluid retention.   Current HTN meds:  Carvedilol 12.5mg  twice daily (6am and 6pm) Clonidine 0.1mg  twice daily Furosemide 40mg  daily (take 60mg  if needed) Entresto 24/26mg  twice daily  BP goal: <130/80  Family History: includes Cardiomyopathy in his mother; Hypertension in his mother; Stroke in his maternal aunt.   Social History: former smoker (quit 7 months), no alcohol in more 2 years  Diet: eating better, decreasing bread and pasta  Exercise:activities of daily living  Home BP readings: none available  Wt Readings from Last 3 Encounters:  07/07/19 (!) 309 lb (140.2 kg)  07/06/19 (!) 311 lb 9.6 oz (141.3 kg)  06/21/19 (!) 312 lb (141.5 kg)   BP Readings from Last 3 Encounters:  07/07/19 (!) 152/106  07/06/19 (!) 142/90  06/21/19 (!) 175/109   Pulse Readings from Last 3 Encounters:  07/07/19 76  07/06/19 68  06/21/19 85    Past Medical History:  Diagnosis Date  . Acute CHF (congestive heart failure) ([place]) 02/10/2017  . Dyspnea   . Hypertension   . Stroke [facility])     Current Outpatient Medications on File Prior to Visit  Medication Sig Dispense Refill  . aspirin 81 MG tablet Take 1 tablet (81 mg total) by mouth daily. 30 tablet 3  . atorvastatin (LIPITOR) 80 MG tablet Take 1 tablet (80 mg total) by mouth daily at 6 PM. 90 tablet 3  . carvedilol (COREG) 12.5 MG tablet Take 1 tablet (12.5 mg total) by mouth 2 (two) times daily with a meal. 180 tablet 3  . cloNIDine (CATAPRES) 0.1 MG tablet  TAKE 1 TABLET (0.1 MG TOTAL) BY MOUTH 2 (TWO) TIMES DAILY. 180 tablet 1  . furosemide (LASIX) 40 MG tablet Take 1 tablet (40 mg total) by mouth daily. (Patient taking differently: Take 60 mg by mouth daily. Just increased to 80mg  daily 06/2019) 90 tablet 1  . Potassium Chloride ER 20 MEQ TBCR TAKE 1 TABLET BY MOUTH ONCE DAILY (Patient taking differently: Take 20 mEq by mouth daily. ) 180 tablet 0   No current facility-administered medications on file prior to visit.     No Known Allergies  Blood pressure (!) 142/90, pulse 68, resp. rate 15, height 5\' 8"  (1.727 m), weight (!) 311 lb 9.6 oz (141.3 kg), SpO2 97 %.  Chronic combined systolic and diastolic CHF (congestive heart failure) ([place]) Patient tolerating current therapy but BP above goal. He denies dizziness, or increase fatigue, currently working on positive lifestyle modifications including low sodium diet and increase physical activity.  Will increase furosemide to 80mg  in AM for 3 days, then back to 40mg  daily and increase Entresto doe to 49/51mg  twice daily. Patient encouraged to contact weight loss management clinic, monitor BP daily, and keep records. Plan to follow up in 2 weeks to re-assess therapy and maximized Entresto dose to 97/103mg  twice daily. Will consider adding Farxiga to  current therapy and decrease furosemide dose accordantly.    Larine Fielding Rodriguez-Guzman PharmD, BCPS, CPP [facility] [address] [place] [address] 07/12/2019 12:15 PM

## 2019-07-07 ENCOUNTER — Ambulatory Visit (INDEPENDENT_AMBULATORY_CARE_PROVIDER_SITE_OTHER): Payer: Self-pay | Admitting: Neurology

## 2019-07-07 ENCOUNTER — Encounter: Payer: Self-pay | Admitting: Neurology

## 2019-07-07 ENCOUNTER — Telehealth: Payer: Self-pay | Admitting: Neurology

## 2019-07-07 VITALS — BP 152/106 | HR 76 | Temp 99.2°F | Ht 68.0 in | Wt 309.0 lb

## 2019-07-07 DIAGNOSIS — I6312 Cerebral infarction due to embolism of basilar artery: Secondary | ICD-10-CM

## 2019-07-07 DIAGNOSIS — R29898 Other symptoms and signs involving the musculoskeletal system: Secondary | ICD-10-CM

## 2019-07-07 DIAGNOSIS — R937 Abnormal findings on diagnostic imaging of other parts of musculoskeletal system: Secondary | ICD-10-CM

## 2019-07-07 DIAGNOSIS — R930 Abnormal findings on diagnostic imaging of skull and head, not elsewhere classified: Secondary | ICD-10-CM

## 2019-07-07 LAB — BASIC METABOLIC PANEL
BUN/Creatinine Ratio: 16 (ref 9–20)
BUN: 16 mg/dL (ref 6–24)
CO2: 25 mmol/L (ref 20–29)
Calcium: 9.2 mg/dL (ref 8.7–10.2)
Chloride: 105 mmol/L (ref 96–106)
Creatinine, Ser: 0.98 mg/dL (ref 0.76–1.27)
GFR calc Af Amer: 101 mL/min/{1.73_m2} (ref >59)
GFR calc non Af Amer: 87 mL/min/{1.73_m2} (ref >59)
Glucose: 76 mg/dL (ref 65–99)
Potassium: 4.7 mmol/L (ref 3.5–5.2)
Sodium: 143 mmol/L (ref 134–144)

## 2019-07-07 LAB — LIPID PANEL
Chol/HDL Ratio: 4 ratio (ref 0.0–5.0)
Cholesterol, Total: 121 mg/dL (ref 100–199)
HDL: 30 mg/dL — ABNORMAL LOW (ref >39)
LDL Chol Calc (NIH): 73 mg/dL (ref 0–99)
Triglycerides: 90 mg/dL (ref 0–149)
VLDL Cholesterol Cal: 18 mg/dL (ref 5–40)

## 2019-07-07 LAB — PRO B NATRIURETIC PEPTIDE: NT-Pro BNP: 123 pg/mL — ABNORMAL HIGH (ref 0–121)

## 2019-07-07 MED ORDER — DIAZEPAM 10 MG PO TABS: ORAL_TABLET | ORAL | 0 refills | Status: DC

## 2019-07-07 NOTE — Patient Instructions (Signed)
At this point, I do not know what is causing the left hand weakness.  We need to perform more tests.  1.  We will have to repeat MRI of brain and cervical spine with and without contrast.  Take Valium 10mg  30 minutes prior to MRI.  You must have a driver to and from the MRI 2.  Following MRI, we will order a spinal tap to check the spinal fluid for inflammation that may be seen with multiple sclerosis.  We will check cell count with diff, protein, glucose, oligoclonal bands, IgG index, myelin basic protein, gram stain and culture and cytology.

## 2019-07-07 NOTE — Telephone Encounter (Signed)
Shayla from [facility] called and said they do not dispense controlled substances from that facility. The patient's prescription for diazepam will need sent to another pharmacy.

## 2019-07-10 ENCOUNTER — Telehealth: Payer: Self-pay | Admitting: *Deleted

## 2019-07-10 NOTE — Telephone Encounter (Signed)
Called patient no answer left message to call office back with another pharmacy. No other pharmacies listed in patient chart.

## 2019-07-10 NOTE — Telephone Encounter (Signed)
Called Nicholas Kelly at [facility] [phone] and she did receive the orders for LP to be done AFTER MRI of brain/cervical spine. All orders received.

## 2019-07-10 NOTE — Addendum Note (Signed)
Addended by: Jesse Fall on: 07/10/2019 11:17 AM   Modules accepted: Orders

## 2019-07-11 NOTE — Telephone Encounter (Signed)
Called patient again today regard Rx and new pharmacy no answer unable to leave message mail box full

## 2019-07-12 ENCOUNTER — Encounter: Payer: Self-pay | Admitting: Pharmacist

## 2019-07-12 ENCOUNTER — Telehealth: Payer: Self-pay

## 2019-07-12 MED ORDER — ENTRESTO 49-51 MG PO TABS: 1.0000 | ORAL_TABLET | Freq: Two times a day (BID) | ORAL | 0 refills | Status: DC

## 2019-07-12 NOTE — Telephone Encounter (Signed)
Called patient to do their pre-visit COVID screening.  Call went to voicemail. Unable to do prescreening.  

## 2019-07-12 NOTE — Assessment & Plan Note (Signed)
Patient tolerating current therapy but BP above goal. He denies dizziness, or increase fatigue, currently working on positive lifestyle modifications including low sodium diet and increase physical activity.  Will increase furosemide to 80mg  in AM for 3 days, then back to 40mg  daily and increase Entresto doe to 49/51mg  twice daily. Patient encouraged to contact weight loss management clinic, monitor BP daily, and keep records. Plan to follow up in 2 weeks to re-assess therapy and maximized Entresto dose to 97/103mg  twice daily. Will consider adding Farxiga to current therapy and decrease furosemide dose accordantly.

## 2019-07-13 ENCOUNTER — Other Ambulatory Visit: Payer: Self-pay

## 2019-07-13 ENCOUNTER — Ambulatory Visit (INDEPENDENT_AMBULATORY_CARE_PROVIDER_SITE_OTHER): Payer: Self-pay | Admitting: Family Medicine

## 2019-07-13 VITALS — BP 131/91 | HR 69 | Temp 97.3°F | Resp 18 | Ht 68.0 in | Wt 308.4 lb

## 2019-07-13 DIAGNOSIS — R202 Paresthesia of skin: Secondary | ICD-10-CM

## 2019-07-13 DIAGNOSIS — I10 Essential (primary) hypertension: Secondary | ICD-10-CM

## 2019-07-13 DIAGNOSIS — Z6841 Body Mass Index (BMI) 40.0 and over, adult: Secondary | ICD-10-CM

## 2019-07-13 DIAGNOSIS — Z1211 Encounter for screening for malignant neoplasm of colon: Secondary | ICD-10-CM

## 2019-07-13 DIAGNOSIS — I5042 Chronic combined systolic (congestive) and diastolic (congestive) heart failure: Secondary | ICD-10-CM

## 2019-07-13 DIAGNOSIS — R140 Abdominal distension (gaseous): Secondary | ICD-10-CM

## 2019-07-13 DIAGNOSIS — Z23 Encounter for immunization: Secondary | ICD-10-CM

## 2019-07-13 DIAGNOSIS — Z8673 Personal history of transient ischemic attack (TIA), and cerebral infarction without residual deficits: Secondary | ICD-10-CM

## 2019-07-13 MED ORDER — POLYETHYLENE GLYCOL 3350 17 GM/SCOOP PO POWD: 17.0000 g | Freq: Every day | ORAL | 1 refills | Status: DC

## 2019-07-13 MED ORDER — FUROSEMIDE 40 MG PO TABS: 60.0000 mg | ORAL_TABLET | Freq: Every day | ORAL | 1 refills | Status: DC

## 2019-07-13 NOTE — Progress Notes (Signed)
Subjective:  Patient ID: Nicholas Fuse., male    DOB: Jun 19, 1966  Age: 53 y.o. MRN: [id number]  CC: Establish Care and Follow-up   HPI Nicholas Acres. 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 He complains of left hand paresthesia which she has had for the last 1 month with associated dropping of things from his left hand which she describes as a week.  He also describes this as feeling like someone pulling a string on his finger.  At night he completely loses sensation in his left hand.  Symptoms started initially with his left hands cramping up and he is left-handed. Seen by neurology with plans for MRI and possible lumbar puncture.  He denies similar symptoms in other body parts and has no abnormalities of gait, vision.  The patient gives a history of multiple sclerosis in his sister. MRI of cervical and thoracic spine from 05/2020 was suspicious for possible underlying demyelinating disease.  With regards to his CHF he is stable and denies shortness of breath or chest pains.  Last visit with cardiology, Dr Tresa Endo was in 06/21/2019. He has no additional concerns today.  Past Medical History:  Diagnosis Date  . Acute CHF (congestive heart failure) (HCC) 02/10/2017  . Dyspnea   . Hypertension   . Stroke [facility])     Past Surgical History:  Procedure Laterality Date  . NO PAST SURGERIES    . RIGHT/LEFT HEART CATH AND CORONARY ANGIOGRAPHY N/A 02/12/2017   Procedure: Right/Left Heart Cath and Coronary Angiography;  Surgeon: Lyn Records, MD;  Location: [facility] INVASIVE CV LAB;  Service: Cardiovascular;  Laterality: N/A;    Family History  Problem Relation Age of Onset  . Hypertension Mother   . Cardiomyopathy Mother   . Stroke Maternal Aunt   . Multiple sclerosis Sister     No Known Allergies  Outpatient Medications Prior to Visit  Medication Sig  Dispense Refill  . aspirin 81 MG tablet Take 1 tablet (81 mg total) by mouth daily. 30 tablet 3  . atorvastatin (LIPITOR) 80 MG tablet Take 1 tablet (80 mg total) by mouth daily at 6 PM. 90 tablet 3  . carvedilol (COREG) 12.5 MG tablet Take 1 tablet (12.5 mg total) by mouth 2 (two) times daily with a meal. 180 tablet 3  . cloNIDine (CATAPRES) 0.1 MG tablet TAKE 1 TABLET (0.1 MG TOTAL) BY MOUTH 2 (TWO) TIMES DAILY. 180 tablet 1  . Potassium Chloride ER 20 MEQ TBCR TAKE 1 TABLET BY MOUTH ONCE DAILY (Patient taking differently: Take 20 mEq by mouth daily. ) 180 tablet 0  . sacubitril-valsartan (ENTRESTO) 49-51 MG Take 1 tablet by mouth 2 (two) times daily. 28 tablet 0  . furosemide (LASIX) 40 MG tablet Take 1 tablet (40 mg total) by mouth daily. (Patient taking differently: Take 60 mg by mouth daily. Just increased to 80mg  daily 06/2019) 90 tablet 1  . diazepam (VALIUM) 10 MG tablet Take 1 tablet 30 minutes prior to MRI 1 tablet 0   No facility-administered medications prior to visit.      ROS Review of Systems  Constitutional: Negative for activity change and appetite change.  HENT: Negative for sinus pressure and sore throat.   Eyes: Negative for visual disturbance.  Respiratory: Negative for cough, chest tightness and shortness of breath.   Cardiovascular: Negative for chest pain and leg swelling.  Gastrointestinal: Negative for abdominal distention, abdominal pain, constipation and diarrhea.  Endocrine: Negative.   Genitourinary: Negative for dysuria.  Musculoskeletal: Negative for joint swelling and myalgias.  Skin: Negative for rash.  Allergic/Immunologic: Negative.   Neurological: Positive for numbness. Negative for weakness and light-headedness.  Psychiatric/Behavioral: Negative for dysphoric mood and suicidal ideas.    Objective:  BP (!) 131/91   Pulse 69   Temp (!) 97.3 F (36.3 C) (Temporal)   Resp 18   Ht 5\' 8"  (1.727 m)   Wt (!) 308 lb 6.4 oz (139.9 kg)   SpO2 96%    BMI 46.89 kg/m   BP/Weight 07/13/2019 07/07/2019 07/06/2019  Systolic BP 131 152 142  Diastolic BP 91 106 90  Wt. (Lbs) 308.4 309 311.6  BMI 46.89 46.98 47.38      Physical Exam Constitutional:      Appearance: He is well-developed.  Cardiovascular:     Rate and Rhythm: Normal rate.     Heart sounds: Normal heart sounds. No murmur.  Pulmonary:     Effort: Pulmonary effort is normal.     Breath sounds: Normal breath sounds. No wheezing or rales.  Chest:     Chest wall: No tenderness.  Abdominal:     General: Bowel sounds are normal. There is no distension.     Palpations: Abdomen is soft. There is no mass.     Tenderness: There is no abdominal tenderness.  Musculoskeletal: Normal range of motion.     Comments: Left hand grip-4/5; right hand grip-5/5 Normal motor strength in lower extremities  Neurological:     Mental Status: He is alert and oriented to person, place, and time.     Sensory: No sensory deficit.     Coordination: Finger-Nose-Finger Test abnormal (L; normal in R).     CMP Latest Ref Rng & Units 07/06/2019 06/15/2019 06/15/2019  Glucose 65 - 99 mg/dL 76 90 94  BUN 6 - 24 mg/dL 16 16(X26(H) 09(U24(H)  Creatinine 0.76 - 1.27 mg/dL 0.450.98 4.091.20 8.111.16  Sodium 134 - 144 mmol/L 143 141 140  Potassium 3.5 - 5.2 mmol/L 4.7 4.3 4.4  Chloride 96 - 106 mmol/L 105 105 105  CO2 20 - 29 mmol/L 25 - 25  Calcium 8.7 - 10.2 mg/dL 9.2 - 9.0  Total Protein 6.5 - 8.1 g/dL - - 7.5  Total Bilirubin 0.3 - 1.2 mg/dL - - 0.7  Alkaline Phos 38 - 126 U/L - - 61  AST 15 - 41 U/L - - 20  ALT 0 - 44 U/L - - 18    Lipid Panel     Component Value Date/Time   CHOL 121 07/06/2019 1432   TRIG 90 07/06/2019 1432   HDL 30 (L) 07/06/2019 1432   CHOLHDL 4.0 07/06/2019 1432   CHOLHDL 4.9 07/18/2017 0558   VLDL 11 07/18/2017 0558   LDLCALC 134 (H) 09/20/2018 0941    CBC    Component Value Date/Time   WBC 9.5 06/15/2019 1855   RBC 4.31 06/15/2019 1855   HGB 14.3 06/15/2019 1904   HGB 13.7  09/20/2018 0941   HCT 42.0 06/15/2019 1904   HCT 40.6 09/20/2018 0941   PLT 273 06/15/2019 1855   PLT 304 09/20/2018 0941   MCV 100.7 (H) 06/15/2019 1855   MCV 97 09/20/2018 0941   MCH 31.8 06/15/2019 1855   MCHC 31.6 06/15/2019 1855   RDW 12.0 06/15/2019 1855   RDW 12.2 (L) 09/20/2018 0941   LYMPHSABS 2.9 06/15/2019 1855  MONOABS 1.4 (H) 06/15/2019 1855   EOSABS 0.3 06/15/2019 1855   BASOSABS 0.0 06/15/2019 1855    Lab Results  Component Value Date   HGBA1C 5.0 07/18/2017    Assessment & Plan:   1. Chronic combined systolic and diastolic CHF (congestive heart failure) (HCC) Euvolemic EF 30% from 06/2017 Continue Lasix, Entresto Followed by cardiology - furosemide (LASIX) 40 MG tablet; Take 1.5 tablets (60 mg total) by mouth daily.  Dispense: 45 tablet; Refill: 1  2. Essential hypertension Slight diastolic elevation No regimen change today Continue antihypertensives Counseled on blood pressure goal of less than 130/80, low-sodium, DASH diet, medication compliance, 150 minutes of moderate intensity exercise per week. Discussed medication compliance, adverse effects.  3. Morbid obesity ([place]) Counseled on 150 minutes of exercise per week, healthy eating (including decreased daily intake of saturated fats, cholesterol, added sugars, sodium)  4. Paresthesia Unknown etiology Currently awaiting MRI and possible lumbar puncture Followed closely by neurology  5. Screening for colon cancer - Fecal occult blood, imunochemical([facility])  6. Bloating - polyethylene glycol powder (GLYCOLAX/MIRALAX) 17 GM/SCOOP powder; Take 17 g by mouth daily.  Dispense: 3350 g; Refill: 1  7. Needs flu shot - Flu Vaccine QUAD 6+ mos PF IM (Fluarix Quad PF)   Meds ordered this encounter  Medications  . furosemide (LASIX) 40 MG tablet    Sig: Take 1.5 tablets (60 mg total) by mouth daily.    Dispense:  45 tablet    Refill:  1  . polyethylene glycol powder (GLYCOLAX/MIRALAX) 17  GM/SCOOP powder    Sig: Take 17 g by mouth daily.    Dispense:  3350 g    Refill:  1    Follow-up: Return in about 1 week (around 07/20/2019) for disability paperwork.       Nicholas Rakes, MD, FAAFP. [facility] [place], [place]   07/13/2019, 11:34 AM

## 2019-07-13 NOTE — Progress Notes (Signed)
Patient is fasting today.  Declines flu shot.  Is ok with refills.  States that he is still having numbness & weakness in his left arm/hand.

## 2019-07-14 ENCOUNTER — Telehealth: Payer: Self-pay | Admitting: Cardiovascular Disease

## 2019-07-14 NOTE — Telephone Encounter (Signed)
07/14/2019 Received FMLA Form from [place] back from [facility] for Dr. Claiborne Billings to signed. Put in mail box.  cbr

## 2019-07-17 ENCOUNTER — Other Ambulatory Visit: Payer: Self-pay | Admitting: Family Medicine

## 2019-07-19 NOTE — Telephone Encounter (Signed)
07/19/2019 Received [employer] FMLA Form back from Dr. Claiborne Billings I then inter- office it back to [place] to finish processing. cbr

## 2019-07-20 ENCOUNTER — Ambulatory Visit: Payer: Self-pay

## 2019-07-24 ENCOUNTER — Ambulatory Visit: Payer: Self-pay

## 2019-07-24 NOTE — Progress Notes (Deleted)
Patient ID: Nicholas Kelly.                 DOB: 1965/11/15                      MRN: [id number]     HPI: Nicholas Kelly. is a 53 y.o. male referred by Dr. Claiborne Billings to pharmacist clinic for medication titration. PHI includes heart failure with baseline EF 30%, uncontrolled hypertension, stroke, tobacco abuse, and hyperlipidemia.   Patient reports compliance with all medication and denies dizziness, SOB, increased fatigue or blurry vision. Noted decreased appetite and fluid retention.   Current HTN meds:  Carvedilol 12.5 mg twice daily (6am and 6pm) Clonidine 0.1 mg twice daily Furosemide 60 mg daily  Entresto 49/51 mg twice daily  BP goal: <130/80  Family History: includes Cardiomyopathy in his mother; Hypertension in his mother; Stroke in his maternal aunt.   Social History: former smoker (quit 7 months), no alcohol in more 2 years  Diet: eating better, decreasing bread and pasta  Exercise:activities of daily living  Home BP readings: none available  Wt Readings from Last 3 Encounters:  07/13/19 (!) 308 lb 6.4 oz (139.9 kg)  07/07/19 (!) 309 lb (140.2 kg)  07/06/19 (!) 311 lb 9.6 oz (141.3 kg)   BP Readings from Last 3 Encounters:  07/13/19 (!) 131/91  07/07/19 (!) 152/106  07/06/19 (!) 142/90   Pulse Readings from Last 3 Encounters:  07/13/19 69  07/07/19 76  07/06/19 68    Past Medical History:  Diagnosis Date  . Acute CHF (congestive heart failure) ([place]) 02/10/2017  . Dyspnea   . Hypertension   . Stroke [facility])     Current Outpatient Medications on File Prior to Visit  Medication Sig Dispense Refill  . aspirin 81 MG tablet Take 1 tablet (81 mg total) by mouth daily. 30 tablet 3  . atorvastatin (LIPITOR) 80 MG tablet Take 1 tablet (80 mg total) by mouth daily at 6 PM. 90 tablet 3  . carvedilol (COREG) 12.5 MG tablet Take 1 tablet (12.5 mg total) by mouth 2 (two) times daily with a meal. 180 tablet 3  . cloNIDine (CATAPRES) 0.1 MG tablet TAKE 1 TABLET  (0.1 MG TOTAL) BY MOUTH 2 (TWO) TIMES DAILY. 180 tablet 1  . furosemide (LASIX) 40 MG tablet Take 1.5 tablets (60 mg total) by mouth daily. 45 tablet 1  . polyethylene glycol powder (GLYCOLAX/MIRALAX) 17 GM/SCOOP powder Take 17 g by mouth daily. 3350 g 1  . Potassium Chloride ER 20 MEQ TBCR TAKE 1 TABLET BY MOUTH ONCE DAILY (Patient taking differently: Take 20 mEq by mouth daily. ) 180 tablet 0  . sacubitril-valsartan (ENTRESTO) 49-51 MG Take 1 tablet by mouth 2 (two) times daily. 28 tablet 0   No current facility-administered medications on file prior to visit.     No Known Allergies  There were no vitals taken for this visit.  No problem-specific Assessment & Plan notes found for this encounter.   Tommy Medal PharmD CPP [facility] [address] [place] [address] 07/24/2019 7:15 AM

## 2019-08-09 ENCOUNTER — Telehealth: Payer: Self-pay | Admitting: Neurology

## 2019-08-09 ENCOUNTER — Other Ambulatory Visit: Payer: Self-pay

## 2019-08-09 NOTE — Telephone Encounter (Signed)
Ask patient to contact pharmacy for refill

## 2019-08-09 NOTE — Telephone Encounter (Signed)
Patient's daughter is calling in about medication. She said it was the new medication that Dr. Tomi Likens prescribed about a month ago and has never been able to pick it up. She didn't know the name. Thanks!

## 2019-08-15 ENCOUNTER — Telehealth: Payer: Self-pay

## 2019-08-15 NOTE — Telephone Encounter (Signed)
Tried to call pt to reschedule 10-27 appt with Hao. No VM available. Will have to CB later 

## 2019-08-18 ENCOUNTER — Telehealth: Payer: Self-pay | Admitting: Neurology

## 2019-08-18 ENCOUNTER — Other Ambulatory Visit: Payer: Self-pay | Admitting: Neurology

## 2019-08-18 MED ORDER — DIAZEPAM 10 MG PO TABS: ORAL_TABLET | ORAL | 0 refills | Status: DC

## 2019-08-18 NOTE — Telephone Encounter (Signed)
Nicholas Kelly called and states the medication could not be filled at the regular Drug store so she would like [place] to call it into the [facility]  She does not know the medication name

## 2019-08-18 NOTE — Telephone Encounter (Signed)
Do not see a Rx in patient chart written by Dr. Tomi Likens. Pt last visit was 07/07/19 no Rx was given.  Dr. Tomi Likens  Did you prescribe something for this patient. All Rx in chart are from patient PCP  Called patient to make him aware of this no answer left on voice mail

## 2019-08-18 NOTE — Telephone Encounter (Signed)
Called patient no answer left message provider sent Rx to pharmacy. Also left number to call to schedule MRI order back in Sept.  [phone] [facility]

## 2019-08-18 NOTE — Telephone Encounter (Signed)
When I saw him in September, I ordered an MRI.  I had prescribed him diazepam 10mg  to take 30 minutes prior to MRI.  I think that is what he is talking about.  I will send in another prescription for the diazepam.  He must make an appointment for the MRI.

## 2019-08-21 ENCOUNTER — Telehealth: Payer: Self-pay | Admitting: Neurology

## 2019-08-21 ENCOUNTER — Telehealth: Payer: Self-pay

## 2019-08-21 NOTE — Telephone Encounter (Signed)
Original Order:  diazepam (VALIUM) 10 MG tablet [[id number]]    Pharmacy:  [place], [place] - [address]  DEA #:  --

## 2019-08-21 NOTE — Telephone Encounter (Signed)
Left message with the after hour service 08-18-19 @ 4:43pm  Caller states she has a question  About where the RX was sent and has questions about MRI   [facility] [address]

## 2019-08-21 NOTE — Telephone Encounter (Signed)
LMTCB. Informed pt on vm that we have tried to reach him several times to let him know his appointment needs to be moved. Informed pt on vm that Isaac Laud will not be seeing patients tomorrow and therefore his appointment will be cancelled. Asked that pt call our office to reschedule his appointment.

## 2019-08-21 NOTE — Telephone Encounter (Signed)
[id number] 08-22-2019 APPT TO VIRTUAL

## 2019-08-21 NOTE — Telephone Encounter (Signed)
Called patient no answer left message to call office back to discuss  

## 2019-08-21 NOTE — Telephone Encounter (Signed)
Attempted to call patient back again today. No answer left another message to call office back regarding below. If patient call please find me do not take a message. Ive made multiple attempts to call him and he does not answer.

## 2019-08-22 ENCOUNTER — Ambulatory Visit: Payer: Self-pay | Admitting: Physician Assistant

## 2019-08-22 NOTE — Telephone Encounter (Signed)
Pt daughter called back she was informed that Rx was sent to pharmacy. Also to call GI on [address] to schedule MRI also Lumbar Puncture needs to be schedule after MRI.  She will call to schedule

## 2019-08-31 ENCOUNTER — Ambulatory Visit (INDEPENDENT_AMBULATORY_CARE_PROVIDER_SITE_OTHER): Payer: Self-pay | Admitting: Pharmacist

## 2019-08-31 ENCOUNTER — Other Ambulatory Visit: Payer: Self-pay

## 2019-08-31 VITALS — BP 138/86 | HR 63 | Resp 17 | Ht 68.0 in | Wt 314.6 lb

## 2019-08-31 DIAGNOSIS — I5042 Chronic combined systolic (congestive) and diastolic (congestive) heart failure: Secondary | ICD-10-CM

## 2019-08-31 DIAGNOSIS — I10 Essential (primary) hypertension: Secondary | ICD-10-CM

## 2019-08-31 LAB — FECAL OCCULT BLOOD, IMMUNOCHEMICAL

## 2019-08-31 MED ORDER — ENTRESTO 97-103 MG PO TABS: 1.0000 | ORAL_TABLET | Freq: Two times a day (BID) | ORAL | 3 refills | Status: DC

## 2019-08-31 NOTE — Progress Notes (Signed)
Patient ID: Nicholas Kelly.                 DOB: [birth year]                      MRN: [id number]     HPI: Nicholas Kelly. is a 53 y.o. male referred by Dr. Tresa Endo to pharmacist clinic for medication titration. PHI includes heart failure with baseline EF 30%, uncontrolled hypertension, stroke, tobacco abuse, and hyperlipidemia.  Patient reports compliance with all medication and denies dizziness, SOB, increased fatigue or blurry vision.  Reports feeling "better than before" and is tolerating Entresto without problems.  Patient complained of inability to lose weight and agreed on talking to our Healthcare navigator for counseling on deit changes and increased physical activity.    Current HTN meds:  Carvedilol 12.5mg  twice daily (6am and 6pm) Clonidine 0.1mg  twice daily Furosemide 40mg  daily (take 60mg  if needed) Entresto 49/51mg  twice daily  BP goal: <130/80  Family History: includes Cardiomyopathy in his mother; Hypertension in his mother; Stroke in his maternal aunt.   Social History: former smoker (quit 7 months), no alcohol in more 2 years  Diet: eating better, decreasing bread and pasta  Exercise:activities of daily living  Home BP readings: none available  Wt Readings from Last 3 Encounters:  08/31/19 (!) 314 lb 9.6 oz (142.7 kg)  07/13/19 (!) 308 lb 6.4 oz (139.9 kg)  07/07/19 (!) 309 lb (140.2 kg)   BP Readings from Last 3 Encounters:  08/31/19 138/86  07/13/19 (!) 131/91  07/07/19 (!) 152/106   Pulse Readings from Last 3 Encounters:  08/31/19 63  07/13/19 69  07/07/19 76    Past Medical History:  Diagnosis Date  . Acute CHF (congestive heart failure) (HCC) 02/10/2017  . Dyspnea   . Hypertension   . Stroke [facility])     Current Outpatient Medications on File Prior to Visit  Medication Sig Dispense Refill  . aspirin 81 MG tablet Take 1 tablet (81 mg total) by mouth daily. 30 tablet 3  . atorvastatin (LIPITOR) 80 MG tablet Take 1 tablet (80 mg total)  by mouth daily at 6 PM. 90 tablet 3  . carvedilol (COREG) 12.5 MG tablet Take 1 tablet (12.5 mg total) by mouth 2 (two) times daily with a meal. 180 tablet 3  . cloNIDine (CATAPRES) 0.1 MG tablet TAKE 1 TABLET (0.1 MG TOTAL) BY MOUTH 2 (TWO) TIMES DAILY. 180 tablet 1  . furosemide (LASIX) 40 MG tablet Take 1.5 tablets (60 mg total) by mouth daily. 45 tablet 1  . polyethylene glycol powder (GLYCOLAX/MIRALAX) 17 GM/SCOOP powder Take 17 g by mouth daily. 3350 g 1  . Potassium Chloride ER 20 MEQ TBCR TAKE 1 TABLET BY MOUTH ONCE DAILY (Patient taking differently: Take 20 mEq by mouth daily. ) 180 tablet 0   No current facility-administered medications on file prior to visit.     No Known Allergies  Blood pressure 138/86, pulse 63, resp. rate 17, height 5\' 8"  (1.727 m), weight (!) 314 lb 9.6 oz (142.7 kg), SpO2 94 %.  Chronic combined systolic and diastolic CHF (congestive heart failure) (HCC) Blood pressure and heart rate remains appropriate for further Entresto titration. Patient failed to complete BMET last week as previously instructed and didn't provide any home BP readings for further assessment.  Will increase Entresto dose to 97/103mg  twice daily, repeat BMET in 1 week, and refer patient to HealthCare navigator for diet and exercise  counseling.  Will follow up as needed after OV with [name] on 09/07/2019.   Jenefer Woerner Rodriguez-Guzman PharmD, BCPS, [place] [address] 09/03/2019 3:59 PM

## 2019-08-31 NOTE — Patient Instructions (Signed)
Return for a  follow up appointment as needed  Go to the lab in [place] your blood pressure at home daily (if able) and keep record of the readings.  Take your BP meds as follows: *INCREASE Entresto dose to 97/103mg  twice daily* - 30 day free card given   *Okay to continue Entresto 49/51mg  twice daily until all gone, then start new dose*  Bring all of your meds, your BP cuff and your record of home blood pressures to your next appointment.  Exercise as you're able, try to walk approximately 30 minutes per day.  Keep salt intake to a minimum, especially watch canned and prepared boxed foods.  Eat more fresh fruits and vegetables and fewer canned items.  Avoid eating in fast food restaurants.    HOW TO TAKE YOUR BLOOD PRESSURE: . Rest 5 minutes before taking your blood pressure. .  Don't smoke or drink caffeinated beverages for at least 30 minutes before. . Take your blood pressure before (not after) you eat. . Sit comfortably with your back supported and both feet on the floor (don't cross your legs). . Elevate your arm to heart level on a table or a desk. . Use the proper sized cuff. It should fit smoothly and snugly around your bare upper arm. There should be enough room to slip a fingertip under the cuff. The bottom edge of the cuff should be 1 inch above the crease of the elbow. . Ideally, take 3 measurements at one sitting and record the average.

## 2019-09-01 ENCOUNTER — Telehealth: Payer: Self-pay

## 2019-09-01 NOTE — Telephone Encounter (Signed)
-----   Message from Charlott Rakes, MD sent at 08/31/2019  3:08 PM EST ----- Please inform him his total test was canceled due to age of the specimen.  This will need to be repeated at next visit mailed as soon as it is collected.

## 2019-09-01 NOTE — Telephone Encounter (Signed)
Patient was called and a voicemail was left informing patient to return phone call for lab results. 

## 2019-09-03 ENCOUNTER — Encounter: Payer: Self-pay | Admitting: Pharmacist

## 2019-09-03 NOTE — Assessment & Plan Note (Signed)
Blood pressure and heart rate remains appropriate for further Entresto titration. Patient failed to complete BMET last week as previously instructed and didn't provide any home BP readings for further assessment.  Will increase Entresto dose to 97/103mg  twice daily, repeat BMET in 1 week, and refer patient to [facility] for diet and exercise counseling.  Will follow up as needed after OV with [name] on 09/07/2019.

## 2019-09-05 LAB — BASIC METABOLIC PANEL
BUN/Creatinine Ratio: 14 (ref 9–20)
BUN: 14 mg/dL (ref 6–24)
CO2: 22 mmol/L (ref 20–29)
Calcium: 9.2 mg/dL (ref 8.7–10.2)
Chloride: 102 mmol/L (ref 96–106)
Creatinine, Ser: 1.01 mg/dL (ref 0.76–1.27)
GFR calc Af Amer: 97 mL/min/{1.73_m2} (ref >59)
GFR calc non Af Amer: 84 mL/min/{1.73_m2} (ref >59)
Glucose: 80 mg/dL (ref 65–99)
Potassium: 4.7 mmol/L (ref 3.5–5.2)
Sodium: 145 mmol/L — ABNORMAL HIGH (ref 134–144)

## 2019-09-07 ENCOUNTER — Ambulatory Visit (INDEPENDENT_AMBULATORY_CARE_PROVIDER_SITE_OTHER): Payer: Self-pay | Admitting: Cardiology

## 2019-09-07 ENCOUNTER — Other Ambulatory Visit: Payer: Self-pay

## 2019-09-07 ENCOUNTER — Encounter: Payer: Self-pay | Admitting: Cardiology

## 2019-09-07 DIAGNOSIS — I6310 Cerebral infarction due to embolism of unspecified precerebral artery: Secondary | ICD-10-CM

## 2019-09-07 DIAGNOSIS — I5042 Chronic combined systolic (congestive) and diastolic (congestive) heart failure: Secondary | ICD-10-CM

## 2019-09-07 DIAGNOSIS — I428 Other cardiomyopathies: Secondary | ICD-10-CM

## 2019-09-07 NOTE — Assessment & Plan Note (Signed)
BMI 47 

## 2019-09-07 NOTE — Assessment & Plan Note (Signed)
EF 30% by echo Sept 2018

## 2019-09-07 NOTE — Patient Instructions (Signed)
Medication Instructions:  Your physician recommends that you continue on your current medications as directed. Please refer to the Current Medication list given to you today. *If you need a refill on your cardiac medications before your next appointment, please call your pharmacy*  Lab Work: None  If you have labs (blood work) drawn today and your tests are completely normal, you will receive your results only by: Marland Kitchen MyChart Message (if you have MyChart) OR . A paper copy in the mail If you have any lab test that is abnormal or we need to change your treatment, we will call you to review the results.  Testing/Procedures: None   Follow-Up: At [facility], you and your health needs are our priority.  As part of our continuing mission to provide you with exceptional heart care, we have created designated Provider Care Teams.  These Care Teams include your primary Cardiologist (physician) and Advanced Practice Providers (APPs -  Physician Assistants and Nurse Practitioners) who all work together to provide you with the care you need, when you need it.  Your next appointment:   2 MONTHS   The format for your next appointment:   In Person  Provider:   [name], PA-C  Other Instructions

## 2019-09-07 NOTE — Progress Notes (Signed)
Cardiology Office Note:    Date:  09/07/2019   ID:  Nicholas Kelly., DOB 04/01/1966, MRN [id number]  PCP:  [name], FNP  Cardiologist:  [name], MD  Electrophysiologist:  None   Referring MD: [name], FNP   No chief complaint on file.   History of Present Illness:    Nicholas Kelly. is a 53 y.o. male with a hx of hypertensive cardiomyopathy originally diagnosed in April 2018.  Catheterization at that time did reveal mild RCA disease with a 30 to 50% stenosis.  His ejection fraction initially was 20 to 25% with grade 3 diastolic dysfunction.  In September 2018 he presented with a CVA.  Ejection fraction by echo then was 30%.  Medical therapy has been hampered by compliance secondary to financial issues.  The patient tells me he is in the process of applying for disability.  He is also applied for Medicaid but cannot get this until he gets his disability.    He was in the office recently to see our pharmacist who has been adjusting the patient's Entresto.  The plan was to increase to the maximum dose of Entresto and follow-up today with me.  Unfortunately the patient has not started this dose yet he is still finishing his prior dose.  From a cardiac standpoint he does well, he denies any unusual dyspnea.  He is watching his salt intake.  He is not had lower extremity edema.  Repeat blood pressure by me in the office was 132/76.  Past Medical History:  Diagnosis Date  . Acute CHF (congestive heart failure) ([place]) 02/10/2017  . Dyspnea   . Hypertension   . Stroke [facility])     Past Surgical History:  Procedure Laterality Date  . NO PAST SURGERIES    . RIGHT/LEFT HEART CATH AND CORONARY ANGIOGRAPHY N/A 02/12/2017   Procedure: Right/Left Heart Cath and Coronary Angiography;  Surgeon: Belva Crome, MD;  Location: [place] [facility];  Service: Cardiovascular;  Laterality: N/A;    Current Medications: Current Meds  Medication Sig  . aspirin 81 MG  tablet Take 1 tablet (81 mg total) by mouth daily.  Marland Kitchen atorvastatin (LIPITOR) 80 MG tablet Take 1 tablet (80 mg total) by mouth daily at 6 PM.  . carvedilol (COREG) 12.5 MG tablet Take 1 tablet (12.5 mg total) by mouth 2 (two) times daily with a meal.  . cloNIDine (CATAPRES) 0.1 MG tablet TAKE 1 TABLET (0.1 MG TOTAL) BY MOUTH 2 (TWO) TIMES DAILY.  . furosemide (LASIX) 40 MG tablet Take 1.5 tablets (60 mg total) by mouth daily.  . polyethylene glycol powder (GLYCOLAX/MIRALAX) 17 GM/SCOOP powder Take 17 g by mouth daily.  . Potassium Chloride ER 20 MEQ TBCR TAKE 1 TABLET BY MOUTH ONCE DAILY (Patient taking differently: Take 20 mEq by mouth daily. )  . sacubitril-valsartan (ENTRESTO) 97-103 MG Take 1 tablet by mouth 2 (two) times daily.     Allergies:   Patient has no known allergies.   Social History   Socioeconomic History  . Marital status: Divorced    Spouse name: Not on file  . Number of children: Not on file  . Years of education: Not on file  . Highest education level: Not on file  Occupational History  . Not on file  Social Needs  . Financial resource strain: Not on file  . Food insecurity    Worry: Not on file    Inability: Not on file  .  Transportation needs    Medical: Not on file    Non-medical: Not on file  Tobacco Use  . Smoking status: Former Smoker    Packs/day: 1.00    Years: 34.00    Pack years: 34.00    Types: Cigarettes    Quit date: 06/28/2016    Years since quitting: 3.1  . Smokeless tobacco: Never Used  Substance and Sexual Activity  . Alcohol use: Not Currently    Comment: stopped in 2018  . Drug use: Yes    Comment: 02/10/2017 "experimented w/drugs; haven't done any for years"  . Sexual activity: Not Currently  Lifestyle  . Physical activity    Days per week: Not on file    Minutes per session: Not on file  . Stress: Not on file  Relationships  . Social Musician on phone: Not on file    Gets together: Not on file    Attends religious  service: Not on file    Active member of club or organization: Not on file    Attends meetings of clubs or organizations: Not on file    Relationship status: Not on file  Other Topics Concern  . Not on file  Social History Narrative   Lives in one story home with daughter; Left handed; some college; caffeine - occasional coffee not daily; walks 2xweek     Family History: The patient's family history includes Cardiomyopathy in his mother; Hypertension in his mother; Multiple sclerosis in his sister; Stroke in his maternal aunt.  ROS:   Please see the history of present illness.     All other systems reviewed and are negative.  EKGs/Labs/Other Studies Reviewed:    The following studies were reviewed today: Echo sept 2018  Recent Labs: 09/20/2018: TSH 2.520 06/15/2019: ALT 18; Hemoglobin 14.3; Platelets 273 07/06/2019: NT-Pro BNP 123 09/04/2019: BUN 14; Creatinine, Ser 1.01; Potassium 4.7; Sodium 145  Recent Lipid Panel    Component Value Date/Time   CHOL 121 07/06/2019 1432   TRIG 90 07/06/2019 1432   HDL 30 (L) 07/06/2019 1432   CHOLHDL 4.0 07/06/2019 1432   CHOLHDL 4.9 07/18/2017 0558   VLDL 11 07/18/2017 0558   LDLCALC 73 07/06/2019 1432    Physical Exam:    VS:  BP (!) 145/92   Pulse 69   Ht 5\' 8"  (1.727 m)   Wt (!) 313 lb 12.8 oz (142.3 kg)   SpO2 95%   BMI 47.71 kg/m     Wt Readings from Last 3 Encounters:  09/07/19 (!) 313 lb 12.8 oz (142.3 kg)  08/31/19 (!) 314 lb 9.6 oz (142.7 kg)  07/13/19 (!) 308 lb 6.4 oz (139.9 kg)     GEN: Obese AA male, well developed in no acute distress HEENT: Normal NECK: No JVD; No carotid bruits LYMPHATICS: No lymphadenopathy CARDIAC:  RRR, no murmurs, rubs, gallops RESPIRATORY:  Clear to auscultation without rales, wheezing or rhonchi  ABDOMEN: Soft, non-tender, non-distended MUSCULOSKELETAL:  No edema; No deformity  SKIN: Warm and dry NEUROLOGIC:  Alert and oriented x 3 PSYCHIATRIC:  Normal affect   ASSESSMENT:     Non-ischemic cardiomyopathy (HCC) EF 30% by echo Sept 2018  Chronic combined systolic and diastolic CHF (congestive heart failure) (HCC) Compensated. Treatment has been hampered by non compliance secondary to financial issues.  He is in the process of getting disability and Medicaid.   Cerebral embolism with cerebral infarction Sept 2018  Morbid obesity (HCC) BMI 47  PLAN:  Start Entresto 97/103 BID as directed. I'll see him back in two months- check BMP then on increased dose.   Medication Adjustments/Labs and Tests Ordered: Current medicines are reviewed at length with the patient today.  Concerns regarding medicines are outlined above.  No orders of the defined types were placed in this encounter.  No orders of the defined types were placed in this encounter.   Patient Instructions  Medication Instructions:  Your physician recommends that you continue on your current medications as directed. Please refer to the Current Medication list given to you today. *If you need a refill on your cardiac medications before your next appointment, please call your pharmacy*  Lab Work: None  If you have labs (blood work) drawn today and your tests are completely normal, you will receive your results only by: Marland Kitchen MyChart Message (if you have MyChart) OR . A paper copy in the mail If you have any lab test that is abnormal or we need to change your treatment, we will call you to review the results.  Testing/Procedures: None   Follow-Up: At [facility], you and your health needs are our priority.  As part of our continuing mission to provide you with exceptional heart care, we have created designated Provider Care Teams.  These Care Teams include your primary Cardiologist (physician) and Advanced Practice Providers (APPs -  Physician Assistants and Nurse Practitioners) who all work together to provide you with the care you need, when you need it.  Your next appointment:   2 MONTHS    The format for your next appointment:   In Person  Provider:   Corine Shelter, PA-C  Other Instructions     Signed, Corine Shelter, PA-C  09/07/2019 11:05 AM    [facility]

## 2019-09-07 NOTE — Assessment & Plan Note (Addendum)
Compensated. Treatment has been hampered by non compliance secondary to financial issues.  He is in the process of getting disability and Medicaid.

## 2019-09-07 NOTE — Assessment & Plan Note (Signed)
Sept 2018

## 2019-09-19 ENCOUNTER — Ambulatory Visit
Admission: RE | Admit: 2019-09-19 | Discharge: 2019-09-19 | Disposition: A | Discharge status: Home / Self Care | Payer: Self-pay | Source: Ambulatory Visit | Attending: Neurology | Admitting: Neurology

## 2019-09-19 DIAGNOSIS — R29898 Other symptoms and signs involving the musculoskeletal system: Secondary | ICD-10-CM

## 2019-09-19 DIAGNOSIS — I6312 Cerebral infarction due to embolism of basilar artery: Secondary | ICD-10-CM

## 2019-09-19 DIAGNOSIS — R930 Abnormal findings on diagnostic imaging of skull and head, not elsewhere classified: Secondary | ICD-10-CM

## 2019-09-19 DIAGNOSIS — R937 Abnormal findings on diagnostic imaging of other parts of musculoskeletal system: Secondary | ICD-10-CM

## 2019-09-19 MED ORDER — GADOBENATE DIMEGLUMINE 529 MG/ML IV SOLN
20.0000 mL | Freq: Once | INTRAVENOUS | Status: AC | PRN
  Administered 2019-09-19: 17:00:00 20 mL via INTRAVENOUS

## 2019-09-20 ENCOUNTER — Telehealth: Payer: Self-pay

## 2019-09-20 DIAGNOSIS — R937 Abnormal findings on diagnostic imaging of other parts of musculoskeletal system: Secondary | ICD-10-CM

## 2019-09-20 NOTE — Telephone Encounter (Signed)
-----   Message from Pieter Partridge, DO sent at 09/20/2019 11:47 AM EST ----- I tried to contacting patient with MRI results but he was not available and had to leave message.  Findings may be multiple sclerosis.  There is abnormality in the spinal cord which could be MS, but there is associated arthritic changes that is slightly pressing the spinal cord and therefore may be due to that.  I would like him to be evaluated by neurosurgery for their opinion regarding whether his weakness may be due to pressing of the spinal cord from arthritis.  In the meantime, I want him to proceed with the scheduled lumbar puncture and schedule follow up with me afterward (virtual visit is fine)

## 2019-09-20 NOTE — Telephone Encounter (Signed)
DPR signed. Patient authorizes all [facility] Practices to release information to daughters Verdie Drown [phone] and Corrina Cassity [phone]. Ok to leave detailed messages on home answering machine [phone].  Pt daughter Sharlot Gowda return call She was informed of results and understands. Referral placed for [place] desk aware to call her back to schedule VV after lumbar puncture on 09/29/19

## 2019-09-29 ENCOUNTER — Ambulatory Visit
Admission: RE | Admit: 2019-09-29 | Discharge: 2019-09-29 | Disposition: A | Discharge status: Home / Self Care | Payer: Self-pay | Source: Ambulatory Visit | Attending: Neurology | Admitting: Neurology

## 2019-09-29 ENCOUNTER — Other Ambulatory Visit (HOSPITAL_COMMUNITY)
Admission: RE | Admit: 2019-09-29 | Discharge: 2019-09-29 | Disposition: A | Discharge status: Home / Self Care | Payer: Self-pay | Source: Ambulatory Visit | Attending: Neurology | Admitting: Neurology

## 2019-09-29 VITALS — BP 148/94 | HR 68

## 2019-09-29 DIAGNOSIS — R29898 Other symptoms and signs involving the musculoskeletal system: Secondary | ICD-10-CM | POA: Insufficient documentation

## 2019-09-29 DIAGNOSIS — R930 Abnormal findings on diagnostic imaging of skull and head, not elsewhere classified: Secondary | ICD-10-CM | POA: Insufficient documentation

## 2019-09-29 DIAGNOSIS — R937 Abnormal findings on diagnostic imaging of other parts of musculoskeletal system: Secondary | ICD-10-CM

## 2019-09-29 DIAGNOSIS — I6312 Cerebral infarction due to embolism of basilar artery: Secondary | ICD-10-CM

## 2019-09-29 LAB — CSF CELL COUNT WITH DIFFERENTIAL
Basophils, %: 0 %
Eosinophils, CSF: 0 %
Lymphs, CSF: 96 % — ABNORMAL HIGH (ref 40–80)
Monocyte/Macrophage: 4 % — ABNORMAL LOW (ref 15–45)
RBC Count, CSF: 0 cells/uL
Segmented Neutrophils-CSF: 0 % (ref 0–6)
WBC, CSF: 28 cells/uL — ABNORMAL HIGH (ref 0–5)

## 2019-09-29 NOTE — Progress Notes (Signed)
Blood obtained from pt's L AC for LP labs. Pt tolerated procedure well. Site is unremarkable. 

## 2019-09-29 NOTE — Discharge Instructions (Signed)

## 2019-10-02 LAB — CYTOLOGY - NON PAP

## 2019-10-04 ENCOUNTER — Other Ambulatory Visit: Payer: Self-pay | Admitting: Family Medicine

## 2019-10-04 ENCOUNTER — Other Ambulatory Visit: Payer: Self-pay | Admitting: Cardiovascular Disease

## 2019-10-04 DIAGNOSIS — I5042 Chronic combined systolic (congestive) and diastolic (congestive) heart failure: Secondary | ICD-10-CM

## 2019-10-04 DIAGNOSIS — E785 Hyperlipidemia, unspecified: Secondary | ICD-10-CM

## 2019-10-05 NOTE — Progress Notes (Signed)
Virtual Visit via Video Note The purpose of this virtual visit is to provide medical care while limiting exposure to the novel coronavirus.    Consent was obtained for video visit:  Yes Answered questions that patient had about telehealth interaction:  Yes I discussed the limitations, risks, security and privacy concerns of performing an evaluation and management service by telemedicine. I also discussed with the patient that there may be a patient responsible charge related to this service. The patient expressed understanding and agreed to proceed.  Pt location: Home Physician Location: office Name of referring provider:  Bing Neighbors, FNP I connected with Nicholas Kelly. at patients initiation/request on 10/09/2019 at 12:50 PM EST by video enabled telemedicine application and verified that I am speaking with the correct person using two identifiers. Pt MRN:  [id number] Pt DOB:  04-10-1966 Video Participants:  Nicholas Kelly.; his daughter   History of Present Illness:  Nicholas Kelly is a 53 year old left-handed black man with non-ischemic cardiomyopathy, hypertension and history of stroke who follows up for questionable multiple sclerosis.  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  After he fell, his arms were contracted.  He was told it was due to trauma and symptoms resolved after a month.  He was admitted to [facility] on 07/17/17 for diplopia and disequilibrium.  Exam revealed disconjugate gaze, with bilateral horizontal gaze palsy with inward deviation.  CT of head showed old right parietal infarct.  MRI of brain showed encephalomalacia in the bilateral pons and right parietal lobe but no acute findings.  Findings in the pons was determined to represent the cause of symptoms, now chronic due to late presentation.  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    In August, he woke up with left arm weakness and arm and hand were slight contracted.  He also noticed that his speech was not normal.  He noted some mild numbness in the left arm as well.  No associated headache, visual disturbance.  He presented to the ED about a week later on 06/15/19 for further evaluation.  CT of brain showed atrophy and chronic small vessel ischemic changes but no acute intracranial abnormality.   MRI of brain without contrast, which was limited due to motion artifact, showed pathy confluent T2/FLAIR signal abnormality involving the periventricular cerebral white matter and pons.  Concern that this may represent demyelinating disease.  No associated restricted diffusion to suggest active demyelination.  MRI of cervical spine without contrast, also limited due to motion artifact, showed patchy cord signal abnormality involving the upper cervical spinal cord at level of C2-3 and C3-4 with no obvious cord expansion to suggest active demyelinating disease.  Also demonstrated, multilevel cervical spondylosis with diffuse mild spinal stenosis at C2-C3 through C5-C6, mild right C4  foraminal narrowing and mild to moderate left C5 foraminal narrowing and mild bilateral C7 foraminal narrowing.  Neurologic exam in the ED revealed questionable mild dysarthria but otherwise no focal or lateralizing findings.  MRI was reviewed by the neurohospitalist who questioned that findings represented a new demyelinating process.  He was discharged with outpatient neurology follow up.   Her sister, who has passed away, had multiple sclerosis.  Past Medical History: Past Medical History:  Diagnosis Date   Acute CHF (congestive heart failure) ([place]) 02/10/2017   Dyspnea    Hypertension    Stroke [facility])     Medications: Outpatient Encounter Medications as of 10/09/2019  Medication Sig   aspirin 81 MG tablet Take 1 tablet (81 mg total) by mouth daily.   atorvastatin (LIPITOR) 80 MG tablet TAKE 1 TABLET (80 MG TOTAL) BY MOUTH DAILY AT 6 PM.   carvedilol (COREG) 12.5 MG tablet TAKE 1 TABLET (12.5 MG TOTAL) BY MOUTH 2 (TWO) TIMES DAILY WITH A MEAL.   cloNIDine (CATAPRES) 0.1 MG tablet TAKE 1 TABLET (0.1 MG TOTAL) BY MOUTH 2 (TWO) TIMES DAILY.   furosemide (LASIX) 40 MG tablet TAKE 1.5 TABLETS (60 MG TOTAL) BY MOUTH DAILY.   polyethylene glycol powder (GLYCOLAX/MIRALAX) 17 GM/SCOOP powder Take 17 g by mouth daily.   potassium chloride SA (KLOR-CON) 20 MEQ tablet TAKE 1 TABLET BY MOUTH ONCE DAILY   sacubitril-valsartan (ENTRESTO) 97-103 MG Take 1 tablet by mouth 2 (two) times daily.   [DISCONTINUED] atorvastatin (LIPITOR) 80 MG tablet Take 1 tablet (80 mg total) by mouth daily at 6 PM.   [DISCONTINUED] carvedilol (COREG) 12.5 MG tablet Take 1 tablet (12.5 mg total) by mouth 2 (two) times daily with a meal.   [DISCONTINUED] cloNIDine (CATAPRES) 0.1 MG tablet TAKE 1 TABLET (0.1 MG TOTAL) BY MOUTH 2 (TWO) TIMES DAILY.   [DISCONTINUED] furosemide (LASIX) 40 MG tablet Take 1.5 tablets (60 mg total) by mouth daily.   [DISCONTINUED] Potassium Chloride ER 20 MEQ TBCR TAKE 1 TABLET BY  MOUTH ONCE DAILY (Patient taking differently: Take 20 mEq by mouth daily. )   No facility-administered encounter medications on file as of 10/09/2019.    Allergies: No Known Allergies  Family History: Family History  Problem Relation Age of Onset   Hypertension Mother    Cardiomyopathy Mother    Stroke Maternal Aunt    Multiple sclerosis Sister     Social History: Social History   Socioeconomic History   Marital status: Divorced    Spouse name: Not on file   Number of children: Not on file   Years of education: Not on file   Highest education level: Not on file  Occupational History   Not on file  Tobacco Use   Smoking status: Former Smoker    Packs/day: 1.00    Years: 34.00    Pack years: 34.00    Types: Cigarettes    Quit date: 06/28/2016    Years since quitting: 3.2   Smokeless tobacco: Never Used  Substance and Sexual Activity   Alcohol use: Not Currently    Comment: stopped in 2018   Drug use: Yes    Comment: 02/10/2017 "experimented w/drugs; haven't done any for years"   Sexual activity: Not Currently  Other Topics Concern   Not on file  Social History Narrative   Lives in one story home with daughter; Left handed; some college; caffeine -  occasional coffee not daily; walks 2xweek   Social Determinants of Health   Financial Resource Strain:    Difficulty of Paying Living Expenses: Not on file  Food Insecurity:    Worried About Programme researcher, broadcasting/film/video in the Last Year: Not on file   [employer] of Food in the Last Year: Not on file  Transportation Needs:    Lack of Transportation (Medical): Not on file   Lack of Transportation (Non-Medical): Not on file  Physical Activity:    Days of Exercise per Week: Not on file   Minutes of Exercise per Session: Not on file  Stress:    Feeling of Stress : Not on file  Social Connections:    Frequency of Communication with Friends and Family: Not on file   Frequency of Social Gatherings with  Friends and Family: Not on file   Attends Religious Services: Not on file   Active Member of Clubs or Organizations: Not on file   Attends Banker Meetings: Not on file   Marital Status: Not on file  Intimate Partner Violence:    Fear of Current or Ex-Partner: Not on file   Emotionally Abused: Not on file   Physically Abused: Not on file   Sexually Abused: Not on file    Observations/Objective:   Height 5\' 8"  (1.727 m), weight (!) 310 lb (140.6 kg). No acute distress.  Alert and oriented.  Speech fluent and not dysarthric.  Language intact.  Eyes orthophoric on primary gaze.  Face symmetric.  Assessment and Plan:   1.  Multiple sclerosis 2.  Cervical degenerative spinal stenosis.  Abnormal cord signal at C2-3 and C3-4 occurring at levels of spinal stenosis may be sequela of compressive myelopathy.    1.  After discussion of DMT, he would like to start Ocrevus.  Will check hepatitis B panel but will also check JC Virus antibody and index, vitamin D and repeat CBC with diff and CMP. 2.  I would like to refer him to neurosurgery for evaluation of possible compressive myelopathy  Follow Up Instructions:    -I discussed the assessment and treatment plan with the patient. The patient was provided an opportunity to ask questions and all were answered. The patient agreed with the plan and demonstrated an understanding of the instructions.   The patient was advised to call back or seek an in-person evaluation if the symptoms worsen or if the condition fails to improve as anticipated.    Total Time spent in visit with the patient was:  25 minutes  Cira Servant, DO

## 2019-10-06 LAB — MYELIN BASIC PROTEIN, CSF: Myelin Basic Protein: <2 mcg/L (ref 2.0–4.0)

## 2019-10-06 LAB — CNS IGG SYNTHESIS RATE, CSF+BLOOD
Albumin Serum: 3.4 g/dL — ABNORMAL LOW (ref 3.5–5.2)
Albumin, CSF: 23.7 mg/dL (ref 8.0–42.0)
CNS-IgG Synthesis Rate: 72.2 mg/24 h — ABNORMAL HIGH (ref <=3.3)
IgG (Immunoglobin G), Serum: 1630 mg/dL (ref 600–1640)
IgG Total CSF: 20.7 mg/dL — ABNORMAL HIGH (ref 0.8–7.7)
IgG-Index: 1.82 — ABNORMAL HIGH (ref <0.66)

## 2019-10-06 LAB — CSF CULTURE W GRAM STAIN
MICRO NUMBER:: 1164713
Result:: NO GROWTH
SPECIMEN QUALITY:: ADEQUATE

## 2019-10-06 LAB — PROTEIN, CSF: Total Protein, CSF: 55 mg/dL — ABNORMAL HIGH (ref 15–45)

## 2019-10-06 LAB — GLUCOSE, CSF: Glucose, CSF: 75 mg/dL (ref 40–80)

## 2019-10-06 LAB — OLIGOCLONAL BANDS, CSF + SERM

## 2019-10-09 ENCOUNTER — Telehealth (INDEPENDENT_AMBULATORY_CARE_PROVIDER_SITE_OTHER): Payer: Self-pay | Admitting: Neurology

## 2019-10-09 ENCOUNTER — Other Ambulatory Visit: Payer: Self-pay

## 2019-10-09 ENCOUNTER — Telehealth: Payer: Self-pay

## 2019-10-09 ENCOUNTER — Encounter: Payer: Self-pay | Admitting: Neurology

## 2019-10-09 VITALS — Ht 68.0 in | Wt 310.0 lb

## 2019-10-09 DIAGNOSIS — M4802 Spinal stenosis, cervical region: Secondary | ICD-10-CM

## 2019-10-09 DIAGNOSIS — G35 Multiple sclerosis: Secondary | ICD-10-CM

## 2019-10-09 NOTE — Telephone Encounter (Signed)
-----   Message from Pieter Partridge, DO sent at 10/09/2019  1:17 PM EST -----  For MS: 1.  Patient will have labs drawn at the lab at [facility] [place]:  Check Hepatitis B, JC Virus antibody with index, vitamin D, CBC with diff and CMP.  2.  I would like to start him on Ocrevus  3.  For cervical spinal stenosis, I would like to refer him to neurosurgery

## 2019-10-09 NOTE — Telephone Encounter (Signed)
Labs place, referral place and fax  Called patient no answer left message to call office back to discuss Ocrevus start form. Patient will need to sign form and give copy of insurance card.  Left message for patient to come by office to sign form and to bring most recent insurance card.

## 2019-10-10 NOTE — Telephone Encounter (Signed)
Alyse Low spoke with patient daughter today. If weather permits she will have patient come tomorrow to sign ocrevus start form

## 2019-10-11 ENCOUNTER — Other Ambulatory Visit: Payer: Self-pay

## 2019-10-11 DIAGNOSIS — G35 Multiple sclerosis: Secondary | ICD-10-CM

## 2019-10-12 LAB — CBC WITH DIFFERENTIAL
Basophils Absolute: 0 10*3/uL (ref 0.0–0.2)
Basos: 1 %
EOS (ABSOLUTE): 0.3 10*3/uL (ref 0.0–0.4)
Eos: 4 %
Hematocrit: 38.9 % (ref 37.5–51.0)
Hemoglobin: 13.1 g/dL (ref 13.0–17.7)
Immature Grans (Abs): 0 10*3/uL (ref 0.0–0.1)
Immature Granulocytes: 0 %
Lymphocytes Absolute: 2.7 10*3/uL (ref 0.7–3.1)
Lymphs: 35 %
MCH: 32.2 pg (ref 26.6–33.0)
MCHC: 33.7 g/dL (ref 31.5–35.7)
MCV: 96 fL (ref 79–97)
Monocytes Absolute: 1 10*3/uL — ABNORMAL HIGH (ref 0.1–0.9)
Monocytes: 12 %
Neutrophils Absolute: 3.7 10*3/uL (ref 1.4–7.0)
Neutrophils: 48 %
RBC: 4.07 x10E6/uL — ABNORMAL LOW (ref 4.14–5.80)
RDW: 12 % (ref 11.6–15.4)
WBC: 7.8 10*3/uL (ref 3.4–10.8)

## 2019-10-23 ENCOUNTER — Encounter: Payer: Self-pay | Admitting: *Deleted

## 2019-10-23 ENCOUNTER — Other Ambulatory Visit: Payer: Self-pay

## 2019-10-23 NOTE — Progress Notes (Signed)
Received a letter of approval from [facility] patient foundation. [place] to receive Ocrevus free of charge as long as the product is available, the patient remains approved from 10/18/2019 until: Therapy is discontinued Financial status or health insurance changes or no longer needs the medication  The shipping method is UPFRONT which means the med will be delivered to our office or his home through the foundation's pharmacy partner [facility] [phone].   [facility] will contact us shortly to coordinate the first shipment. All subsequent shipments will be coordinated proactively by contacting [facility] directly. Service request ID: [id number] Patient ID: [id number]  Letter sent to scan

## 2019-10-24 ENCOUNTER — Ambulatory Visit: Payer: Self-pay

## 2019-10-24 ENCOUNTER — Other Ambulatory Visit (HOSPITAL_COMMUNITY): Payer: Self-pay | Admitting: General Practice

## 2019-10-24 ENCOUNTER — Other Ambulatory Visit: Payer: Self-pay

## 2019-10-26 LAB — STRATIFY JCV(TM) AB W/INDEX
JCV Antibody: POSITIVE — AB
JCV Index Value: 1.66

## 2019-10-30 ENCOUNTER — Telehealth: Payer: Self-pay | Admitting: Internal Medicine

## 2019-10-30 NOTE — Telephone Encounter (Signed)
Call Pt left message for Pt to call the office to set up appointment for infusion

## 2019-10-30 NOTE — Telephone Encounter (Signed)
I contacted patients sister, all orders have been placed and patient has received assistance from the Foundation to receive ORcrevus. He has no insurance she is going to reach out and see what options he has. All orders have been placed and pt is aware that appt is to be scheduled

## 2019-11-08 ENCOUNTER — Ambulatory Visit: Payer: Self-pay | Admitting: Cardiology

## 2019-11-09 ENCOUNTER — Other Ambulatory Visit (INDEPENDENT_AMBULATORY_CARE_PROVIDER_SITE_OTHER): Payer: Self-pay

## 2019-11-09 ENCOUNTER — Other Ambulatory Visit: Payer: Self-pay

## 2019-11-09 ENCOUNTER — Ambulatory Visit (HOSPITAL_COMMUNITY)
Admission: RE | Admit: 2019-11-09 | Discharge: 2019-11-09 | Disposition: A | Discharge status: Home / Self Care | Payer: Self-pay | Source: Ambulatory Visit | Attending: Internal Medicine | Admitting: Internal Medicine

## 2019-11-09 ENCOUNTER — Telehealth: Payer: Self-pay | Admitting: Neurology

## 2019-11-09 DIAGNOSIS — G35 Multiple sclerosis: Secondary | ICD-10-CM | POA: Insufficient documentation

## 2019-11-09 MED ORDER — SODIUM CHLORIDE 0.9 % IV SOLN
300.0000 mg | Freq: Once | INTRAVENOUS | Status: DC
  Filled 2019-11-09: qty 10

## 2019-11-09 MED ORDER — METHYLPREDNISOLONE SODIUM SUCC 125 MG IJ SOLR: 100.0000 mg | Freq: Once | INTRAMUSCULAR | Status: DC

## 2019-11-09 MED ORDER — SODIUM CHLORIDE 0.9 % IV SOLN: INTRAVENOUS | Status: DC | PRN

## 2019-11-09 MED ORDER — ACETAMINOPHEN 325 MG PO TABS: 650.0000 mg | ORAL_TABLET | Freq: Once | ORAL | Status: DC

## 2019-11-09 MED ORDER — DIPHENHYDRAMINE HCL 50 MG/ML IJ SOLN: 25.0000 mg | Freq: Once | INTRAMUSCULAR | Status: DC

## 2019-11-09 NOTE — Telephone Encounter (Signed)
Pt needs to contact patient care where he went today to reschedule, thanks

## 2019-11-09 NOTE — Addendum Note (Signed)
Addended by: Adline Mango I on: 11/09/2019 11:33 AM   Modules accepted: Orders

## 2019-11-09 NOTE — Progress Notes (Signed)
Patient could not get the Ocrevus infused today because there is no lab result for Hep B. Patient will go and get the lab done, call back to re-schedule. Per Neysa Bonito, patient is to receive the first dose of Ocrevus, come back in 2 weeks for the second dose and then every 6 months for subsequent doses. Orders for the 6 months infusion will be sent to the [facility] either as signed and held or by fax.

## 2019-11-09 NOTE — Telephone Encounter (Signed)
Patient came by the office today to have his labs taken and wanted to see if he could have his Infusion Rescheduled? Please Call. Thank you

## 2019-11-09 NOTE — Addendum Note (Signed)
Addended by: Adline Mango I on: 11/09/2019 11:32 AM   Modules accepted: Orders

## 2019-11-09 NOTE — Addendum Note (Signed)
Addended by: STONE-ELMORE, Feliberto Stockley I on: 11/09/2019 11:33 AM   Modules accepted: Orders  

## 2019-11-10 LAB — COMPLETE METABOLIC PANEL WITH GFR
AG Ratio: 1.1 (calc) (ref 1.0–2.5)
ALT: 20 U/L (ref 9–46)
AST: 18 U/L (ref 10–35)
Albumin: 4 g/dL (ref 3.6–5.1)
Alkaline phosphatase (APISO): 70 U/L (ref 35–144)
BUN: 16 mg/dL (ref 7–25)
CO2: 26 mmol/L (ref 20–32)
Calcium: 9 mg/dL (ref 8.6–10.3)
Chloride: 103 mmol/L (ref 98–110)
Creat: 1.07 mg/dL (ref 0.70–1.33)
GFR, Est African American: 91 mL/min/{1.73_m2} (ref >=60)
GFR, Est Non African American: 78 mL/min/{1.73_m2} (ref >=60)
Globulin: 3.5 g/dL (calc) (ref 1.9–3.7)
Glucose, Bld: 165 mg/dL — ABNORMAL HIGH (ref 65–99)
Potassium: 3.8 mmol/L (ref 3.5–5.3)
Sodium: 138 mmol/L (ref 135–146)
Total Bilirubin: 0.7 mg/dL (ref 0.2–1.2)
Total Protein: 7.5 g/dL (ref 6.1–8.1)

## 2019-11-10 LAB — VITAMIN D 25 HYDROXY (VIT D DEFICIENCY, FRACTURES): Vit D, 25-Hydroxy: 12 ng/mL — ABNORMAL LOW (ref 30–100)

## 2019-11-10 LAB — HEPATITIS B SURFACE ANTIBODY,QUALITATIVE: Hep B S Ab: NONREACTIVE

## 2019-11-14 ENCOUNTER — Ambulatory Visit (HOSPITAL_COMMUNITY)
Admission: RE | Admit: 2019-11-14 | Discharge: 2019-11-14 | Disposition: A | Discharge status: Home / Self Care | Payer: Self-pay | Source: Ambulatory Visit | Attending: Internal Medicine | Admitting: Internal Medicine

## 2019-11-14 ENCOUNTER — Telehealth: Payer: Self-pay | Admitting: Neurology

## 2019-11-14 ENCOUNTER — Other Ambulatory Visit: Payer: Self-pay

## 2019-11-14 MED ORDER — DIPHENHYDRAMINE HCL 50 MG/ML IJ SOLN
25.0000 mg | Freq: Once | INTRAMUSCULAR | Status: AC
  Administered 2019-11-14: 25 mg via INTRAVENOUS
  Filled 2019-11-14: qty 1

## 2019-11-14 MED ORDER — METHYLPREDNISOLONE SODIUM SUCC 125 MG IJ SOLR
100.0000 mg | Freq: Once | INTRAMUSCULAR | Status: AC
  Administered 2019-11-14: 100 mg via INTRAVENOUS
  Filled 2019-11-14: qty 2

## 2019-11-14 MED ORDER — SODIUM CHLORIDE 0.9 % IV SOLN
INTRAVENOUS | Status: DC | PRN
  Administered 2019-11-14: 250 mL via INTRAVENOUS

## 2019-11-14 MED ORDER — SODIUM CHLORIDE 0.9 % IV SOLN
300.0000 mg | Freq: Once | INTRAVENOUS | Status: AC
  Administered 2019-11-14: 300 mg via INTRAVENOUS
  Filled 2019-11-14: qty 10

## 2019-11-14 MED ORDER — ACETAMINOPHEN 325 MG PO TABS
650.0000 mg | ORAL_TABLET | Freq: Once | ORAL | Status: AC
  Administered 2019-11-14: 650 mg via ORAL
  Filled 2019-11-14: qty 2

## 2019-11-14 NOTE — Progress Notes (Signed)
Spoke with RN from Dr. Moises Blood office to confirm he was aware of positive JCV antibody prior to starting Ocrevus. RN confirmed MD was aware and wished to proceed with infusion.   Bernadene Person, PharmD, BCPS [facility] [phone] 11/14/2019, 10:22 AM

## 2019-11-14 NOTE — Progress Notes (Signed)
Patient received IV Ocrevus, titrated per protocol as ordered by Shon Millet MD. Pre infusion medications - PO Tylenol, IV Solu-medrol, IV benadryl were given. Observed for at least 60 minutes post infusion. Post infusion BP was 172/106. Randel Pigg MD's office was contacted. RN spoke with Herbert Seta. Per MD, since patient feels "fine", patient can be discharged. However, patient  agreed to check his BP on his way home at the drug store, patient also agreed to contact his PCP about his BP and to go to the ER if there is an emergency. Tolerated well, discharge instructions given, verbalized understanding. Patient alert, oriented and ambulatory at the time of discharge.

## 2019-11-14 NOTE — Discharge Instructions (Signed)
Ocrelizumab injection °What is this medicine? °OCRELIZUMAB (ok re LIZ ue mab) treats multiple sclerosis. It helps to decrease the number of multiple sclerosis relapses. It is not a cure. °This medicine may be used for other purposes; ask your health care provider or pharmacist if you have questions. °COMMON BRAND NAME(S): OCREVUS °What should I tell my health care provider before I take this medicine? °They need to know if you have any of these conditions: °· cancer °· hepatitis B infection °· other infection (especially a virus infection such as chickenpox, cold sores, or herpes) °· an unusual or allergic reaction to ocrelizumab, other medicines, foods, dyes or preservatives °· pregnant or trying to get pregnant °· breast-feeding °How should I use this medicine? °This medicine is for infusion into a vein. It is given by a health care professional in a hospital or clinic setting. °A special MedGuide will be given to you before each treatment. Be sure to read this information carefully each time. °Talk to your pediatrician regarding the use of this medicine in children. Special care may be needed. °Overdosage: If you think you have taken too much of this medicine contact a poison control center or emergency room at once. °NOTE: This medicine is only for you. Do not share this medicine with others. °What if I miss a dose? °Keep appointments for follow-up doses as directed. It is important not to miss your dose. Call your doctor or health care professional if you are unable to keep an appointment. °What may interact with this medicine? °· alemtuzumab °· daclizumab °· dimethyl fumarate °· fingolimod °· glatiramer °· interferon beta °· live virus vaccines °· mitoxantrone °· natalizumab °· peginterferon beta °· rituximab °· steroid medicines like prednisone or cortisone °· teriflunomide °This list may not describe all possible interactions. Give your health care provider a list of all the medicines, herbs,  non-prescription drugs, or dietary supplements you use. Also tell them if you smoke, drink alcohol, or use illegal drugs. Some items may interact with your medicine. °What should I watch for while using this medicine? °Tell your doctor or healthcare professional if your symptoms do not start to get better or if they get worse. °This medicine can cause serious allergic reactions. To reduce your risk you may need to take medicine before treatment with this medicine. Take your medicine as directed. °Women should inform their doctor if they wish to become pregnant or think they might be pregnant. There is a potential for serious side effects to an unborn child. Talk to your health care professional or pharmacist for more information. Male patients should use effective birth control methods while receiving this medicine and for 6 months after the last dose. °Call your doctor or health care professional for advice if you get a fever, chills or sore throat, or other symptoms of a cold or flu. Do not treat yourself. This drug decreases your body's ability to fight infections. Try to avoid being around people who are sick. °If you have a hepatitis B infection or a history of a hepatitis B infection, talk to your doctor. The symptoms of hepatitis B may get worse if you take this medicine. °In some patients, this medicine may cause a serious brain infection that may cause death. If you have any problems seeing, thinking, speaking, walking, or standing, tell your doctor right away. If you cannot reach your doctor, urgently seek other source of medical care. °This medicine can decrease the response to a vaccine. If you need to get   vaccinated, tell your healthcare professional if you have received this medicine. Extra booster doses may be needed. Talk to your doctor to see if a different vaccination schedule is needed. °Talk to your doctor about your risk of cancer. You may be more at risk for certain types of cancers if you  take this medicine. °What side effects may I notice from receiving this medicine? °Side effects that you should report to your doctor or health care professional as soon as possible: °· allergic reactions like skin rash, itching or hives, swelling of the face, lips, or tongue °· breathing problems °· facial flushing °· fast, irregular heartbeat °· lump or soreness in the breast °· signs and symptoms of herpes such as cold sore, shingles, or genital sores °· signs and symptoms of infection like fever or chills, cough, sore throat, pain or trouble passing urine °· signs and symptoms of low blood pressure like dizziness; feeling faint or lightheaded, falls; unusually weak or tired °· signs and symptoms of progressive multifocal leukoencephalopathy (PML) like changes in vision; clumsiness; confusion; personality changes; weakness on one side of the body °· swelling of the ankles, feet, hands °Side effects that usually do not require medical attention (report these to your doctor or health care professional if they continue or are bothersome): °· back pain °· depressed mood °· diarrhea °· pain, redness, or irritation at site where injected °This list may not describe all possible side effects. Call your doctor for medical advice about side effects. You may report side effects to FDA at 1-800-FDA-1088. °Where should I keep my medicine? °This drug is given in a hospital or clinic and will not be stored at home. °NOTE: This sheet is a summary. It may not cover all possible information. If you have questions about this medicine, talk to your doctor, pharmacist, or health care provider. °© 2020 [employer]vier/Gold Standard (2018-10-17 07:41:53) ° °

## 2019-11-14 NOTE — Telephone Encounter (Signed)
[facility] called needing to speak with Dr. Everlena Cooper regarding this patient and a question he has about his labs. Patient is at the Pharmacy now.  Please Call. Thank you

## 2019-11-14 NOTE — Telephone Encounter (Signed)
Spoke with Kenard Gower at the in patient pharmacy which is inside of the patient care center. He wanted to verify that Dr. Everlena Cooper is aware that patient has a positive jc antibody and this is causing an alert before pt receives his Ocrevus infusion today.  Per Dr. Everlena Cooper he is aware and the positive antibody is not contraindicated for pt not to have the infusion. Informed pharmacist Kenard Gower of this.

## 2019-11-22 ENCOUNTER — Ambulatory Visit (INDEPENDENT_AMBULATORY_CARE_PROVIDER_SITE_OTHER): Payer: Self-pay | Admitting: Cardiology

## 2019-11-22 ENCOUNTER — Encounter: Payer: Self-pay | Admitting: Cardiology

## 2019-11-22 ENCOUNTER — Other Ambulatory Visit: Payer: Self-pay

## 2019-11-22 VITALS — BP 164/100 | HR 77 | Temp 98.0°F | Ht 69.0 in | Wt 323.8 lb

## 2019-11-22 DIAGNOSIS — R0683 Snoring: Secondary | ICD-10-CM

## 2019-11-22 DIAGNOSIS — I10 Essential (primary) hypertension: Secondary | ICD-10-CM

## 2019-11-22 DIAGNOSIS — I5042 Chronic combined systolic (congestive) and diastolic (congestive) heart failure: Secondary | ICD-10-CM

## 2019-11-22 DIAGNOSIS — I428 Other cardiomyopathies: Secondary | ICD-10-CM

## 2019-11-22 MED ORDER — FUROSEMIDE 80 MG PO TABS: 80.0000 mg | ORAL_TABLET | Freq: Every day | ORAL | 5 refills | Status: DC

## 2019-11-22 MED ORDER — LOSARTAN POTASSIUM 50 MG PO TABS: 50.0000 mg | ORAL_TABLET | Freq: Every day | ORAL | 5 refills | Status: DC

## 2019-11-22 NOTE — Patient Instructions (Addendum)
Medication Instructions:  START Losartan 50mg  Take 1 tablet once a day  STOP Entresto INCREASE Lasix to 80mg  Take 1 tablet once a day  *If you need a refill on your cardiac medications before your next appointment, please call your pharmacy*  Lab Work: None  If you have labs (blood work) drawn today and your tests are completely normal, you will receive your results only by: MyChart Message (if you have MyChart) OR . A paper copy in the mail If you have any lab test that is abnormal or we need to change your treatment, we will call you to review the results.  Testing/Procedures: Your physician has recommended that you have a sleep study. This test records several body functions during sleep, including: brain activity, eye movement, oxygen and carbon dioxide blood levels, heart rate and rhythm, breathing rate and rhythm, the flow of air through your mouth and nose, snoring, body muscle movements, and chest and belly movement. WANDA OUR SLEEP COORDINATOR WILL BE IN TOUCH WITH YOU   Follow-Up: At [facility], you and your health needs are our priority.  As part of our continuing mission to provide you with exceptional heart care, we have created designated Provider Care Teams.  These Care Teams include your primary Cardiologist (physician) and Advanced Practice Providers (APPs -  Physician Assistants and Nurse Practitioners) who all work together to provide you with the care you need, when you need it.  Your next appointment:   4 week(s)  The format for your next appointment:   In Person  Provider:   Marland Kitchen, PA-C  Other Instructions

## 2019-11-23 NOTE — Progress Notes (Signed)
Cardiology Office Note:    Date:  11/23/2019   ID:  Nicholas Kelly., DOB September 27, 1966, MRN [id number]  PCP:  [name], FNP  Cardiologist:  [name], MD  Electrophysiologist:  None   Referring MD: [name], FNP   CC: edema, weight gain  History of Present Illness:    Nicholas Kelly. is a pleasant 54 y.o. AA male with a hx of hypertensive cardiomyopathy originally diagnosed in April 2018.  Catheterization at that time did reveal mild RCA disease with a 30 to 50% stenosis.  His ejection fraction initially was 20 to 25% with grade 3 diastolic dysfunction.  In September 2018 he presented with a CVA.  Ejection fraction by echo then was 30%.  Medical therapy has been hampered by compliance secondary to financial issues.  He did get on Entresto for a time and was titrated to max dose but is now back off it secondary to cost and the fact that he still doesn't have Medicaid.  In the office today he has noticed increased edema and weight gain.  Past Medical History:  Diagnosis Date  . Acute CHF (congestive heart failure) ([place]) 02/10/2017  . Dyspnea   . Hypertension   . Stroke [facility])     Past Surgical History:  Procedure Laterality Date  . NO PAST SURGERIES    . RIGHT/LEFT HEART CATH AND CORONARY ANGIOGRAPHY N/A 02/12/2017   Procedure: Right/Left Heart Cath and Coronary Angiography;  Surgeon: Belva Crome, MD;  Location: [place] [facility];  Service: Cardiovascular;  Laterality: N/A;    Current Medications: Current Meds  Medication Sig  . aspirin 81 MG tablet Take 1 tablet (81 mg total) by mouth daily.  Marland Kitchen atorvastatin (LIPITOR) 80 MG tablet TAKE 1 TABLET (80 MG TOTAL) BY MOUTH DAILY AT 6 PM.  . carvedilol (COREG) 12.5 MG tablet TAKE 1 TABLET (12.5 MG TOTAL) BY MOUTH 2 (TWO) TIMES DAILY WITH A MEAL.  . cloNIDine (CATAPRES) 0.1 MG tablet TAKE 1 TABLET (0.1 MG TOTAL) BY MOUTH 2 (TWO) TIMES DAILY.  . furosemide (LASIX) 80 MG tablet Take 1 tablet (80 mg total)  by mouth daily.  . polyethylene glycol powder (GLYCOLAX/MIRALAX) 17 GM/SCOOP powder Take 17 g by mouth daily.  . potassium chloride SA (KLOR-CON) 20 MEQ tablet TAKE 1 TABLET BY MOUTH ONCE DAILY  . [DISCONTINUED] furosemide (LASIX) 40 MG tablet TAKE 1.5 TABLETS (60 MG TOTAL) BY MOUTH DAILY.  . [DISCONTINUED] sacubitril-valsartan (ENTRESTO) 97-103 MG Take 1 tablet by mouth 2 (two) times daily.     Allergies:   Patient has no known allergies.   Social History   Socioeconomic History  . Marital status: Divorced    Spouse name: Not on file  . Number of children: 4  . Years of education: Not on file  . Highest education level: High school graduate  Occupational History  . Not on file  Tobacco Use  . Smoking status: Former Smoker    Packs/day: 1.00    Years: 34.00    Pack years: 34.00    Types: Cigarettes    Quit date: 06/28/2016    Years since quitting: 3.4  . Smokeless tobacco: Never Used  Substance and Sexual Activity  . Alcohol use: Not Currently    Comment: stopped in 2018  . Drug use: Yes    Comment: 02/10/2017 "experimented w/drugs; haven't done any for years"  . Sexual activity: Not Currently  Other Topics Concern  . Not on file  Social  History Narrative   Lives in one story home with daughter; Left handed; some college; caffeine - occasional coffee not daily; walks 2xweek   Social Determinants of Health   Financial Resource Strain:   . Difficulty of Paying Living Expenses: Not on file  Food Insecurity:   . Worried About Programme researcher, broadcasting/film/video in the Last Year: Not on file  . Ran Out of Food in the Last Year: Not on file  Transportation Needs:   . Lack of Transportation (Medical): Not on file  . Lack of Transportation (Non-Medical): Not on file  Physical Activity:   . Days of Exercise per Week: Not on file  . Minutes of Exercise per Session: Not on file  Stress:   . Feeling of Stress : Not on file  Social Connections:   . Frequency of Communication with Friends and  Family: Not on file  . Frequency of Social Gatherings with Friends and Family: Not on file  . Attends Religious Services: Not on file  . Active Member of Clubs or Organizations: Not on file  . Attends Banker Meetings: Not on file  . Marital Status: Not on file     Family History: The patient's family history includes Cardiomyopathy in his mother; Hypertension in his mother; Multiple sclerosis in his sister; Stroke in his maternal aunt.  ROS:   Please see the history of present illness.     All other systems reviewed and are negative.  EKGs/Labs/Other Studies Reviewed:    The following studies were reviewed today: Echo sept 2018-   EKG:  EKG is not ordered today.  The ekg ordered 06/16/2019 demonstrates NSR, LAD, RBBB, LVH, HR 64  Recent Labs: 06/15/2019: Platelets 273 07/06/2019: NT-Pro BNP 123 10/11/2019: Hemoglobin 13.1 11/09/2019: ALT 20; BUN 16; Creat 1.07; Potassium 3.8; Sodium 138  Recent Lipid Panel    Component Value Date/Time   CHOL 121 07/06/2019 1432   TRIG 90 07/06/2019 1432   HDL 30 (L) 07/06/2019 1432   CHOLHDL 4.0 07/06/2019 1432   CHOLHDL 4.9 07/18/2017 0558   VLDL 11 07/18/2017 0558   LDLCALC 73 07/06/2019 1432    Physical Exam:    VS:  BP (!) 164/100   Pulse 77   Temp 98 F (36.7 C)   Ht 5\' 9"  (1.753 m)   Wt (!) 323 lb 12.8 oz (146.9 kg)   SpO2 97%   BMI 47.82 kg/m     Wt Readings from Last 3 Encounters:  11/22/19 (!) 323 lb 12.8 oz (146.9 kg)  10/09/19 (!) 310 lb (140.6 kg)  09/07/19 (!) 313 lb 12.8 oz (142.3 kg)     GEN: Morbidly obese AA male, well developed in no acute distress HEENT: Normal NECK: No JVD; No carotid bruits CARDIAC: RRR, no murmurs, rubs, gallops RESPIRATORY:  Clear to auscultation without rales, wheezing or rhonchi  ABDOMEN: Soft, non-tender, non-distended MUSCULOSKELETAL:  Trace-1+ edema; No deformity  SKIN: Warm and dry NEUROLOGIC:  Alert and oriented x 3 PSYCHIATRIC:  Normal affect   ASSESSMENT:     Chronic combined systolic and diastolic CHF (congestive heart failure) (HCC) Compensated. Treatment has been hampered by non compliance secondary to financial issues.  He is in the process of getting disability and Medicaid. He did do well on Entresto max dose when he was provided it.   Non-ischemic cardiomyopathy (HCC) EF 30% by echo Sept 2018  Cerebral embolism with cerebral infarction Sept 2018  Morbid obesity (HCC) BMI 47- suspected sleep apnea- snoring, poor  sleep, daytime fatigue, HTN, diastolic CHF, morbid obesity    PLAN:    For now add losartan 50 mg daily and increase lasix to 80 mg daily.  I'll order a sleep study- hopefully he will have his Medicaid in the next few weeks.    Medication Adjustments/Labs and Tests Ordered: Current medicines are reviewed at length with the patient today.  Concerns regarding medicines are outlined above.  Orders Placed This Encounter  Procedures  . Split night study   Meds ordered this encounter  Medications  . losartan (COZAAR) 50 MG tablet    Sig: Take 1 tablet (50 mg total) by mouth daily.    Dispense:  30 tablet    Refill:  5  . furosemide (LASIX) 80 MG tablet    Sig: Take 1 tablet (80 mg total) by mouth daily.    Dispense:  30 tablet    Refill:  5    Patient Instructions  Medication Instructions:  START Losartan 50mg  Take 1 tablet once a day  STOP Entresto INCREASE Lasix to 80mg  Take 1 tablet once a day  *If you need a refill on your cardiac medications before your next appointment, please call your pharmacy*  Lab Work: None  If you have labs (blood work) drawn today and your tests are completely normal, you will receive your results only by: MyChart Message (if you have MyChart) OR . A paper copy in the mail If you have any lab test that is abnormal or we need to change your treatment, we will call you to review the results.  Testing/Procedures: Your physician has recommended that you have a sleep study. This  test records several body functions during sleep, including: brain activity, eye movement, oxygen and carbon dioxide blood levels, heart rate and rhythm, breathing rate and rhythm, the flow of air through your mouth and nose, snoring, body muscle movements, and chest and belly movement. WANDA OUR SLEEP COORDINATOR WILL BE IN TOUCH WITH YOU   Follow-Up: At [facility], you and your health needs are our priority.  As part of our continuing mission to provide you with exceptional heart care, we have created designated Provider Care Teams.  These Care Teams include your primary Cardiologist (physician) and Advanced Practice Providers (APPs -  Physician Assistants and Nurse Practitioners) who all work together to provide you with the care you need, when you need it.  Your next appointment:   4 week(s)  The format for your next appointment:   In Person  Provider:   Marland Kitchen, PA-C  Other Instructions     Signed, [facility], PA-C  11/23/2019 1:35 PM    [facility]

## 2019-11-24 ENCOUNTER — Telehealth: Payer: Self-pay

## 2019-11-24 NOTE — Telephone Encounter (Signed)
Tried to reach patient two times; no luck. Unable to leave a voicemail, mailbox was full.   (patient will need to contact Burna Mortimer once he gets approved for his Medicaid-unable to tell patient should he call back)

## 2019-11-28 ENCOUNTER — Other Ambulatory Visit: Payer: Self-pay

## 2019-11-28 ENCOUNTER — Ambulatory Visit (HOSPITAL_COMMUNITY)
Admission: RE | Admit: 2019-11-28 | Discharge: 2019-11-28 | Disposition: A | Discharge status: Home / Self Care | Payer: Self-pay | Source: Ambulatory Visit | Attending: Internal Medicine | Admitting: Internal Medicine

## 2019-11-28 DIAGNOSIS — G35 Multiple sclerosis: Secondary | ICD-10-CM | POA: Insufficient documentation

## 2019-11-28 MED ORDER — DIPHENHYDRAMINE HCL 50 MG/ML IJ SOLN
25.0000 mg | Freq: Once | INTRAMUSCULAR | Status: AC
  Administered 2019-11-28: 09:00:00 25 mg via INTRAVENOUS
  Filled 2019-11-28: qty 1

## 2019-11-28 MED ORDER — ACETAMINOPHEN 325 MG PO TABS
650.0000 mg | ORAL_TABLET | Freq: Once | ORAL | Status: AC
  Administered 2019-11-28: 650 mg via ORAL
  Filled 2019-11-28: qty 2

## 2019-11-28 MED ORDER — SODIUM CHLORIDE 0.9 % IV SOLN
300.0000 mg | Freq: Once | INTRAVENOUS | Status: AC
  Administered 2019-11-28: 300 mg via INTRAVENOUS
  Filled 2019-11-28: qty 10

## 2019-11-28 MED ORDER — SODIUM CHLORIDE 0.9 % IV SOLN
INTRAVENOUS | Status: DC | PRN
  Administered 2019-11-28: 250 mL via INTRAVENOUS

## 2019-11-28 MED ORDER — METHYLPREDNISOLONE SODIUM SUCC 125 MG IJ SOLR
100.0000 mg | Freq: Once | INTRAMUSCULAR | Status: AC
  Administered 2019-11-28: 09:00:00 100 mg via INTRAVENOUS
  Filled 2019-11-28: qty 2

## 2019-11-28 NOTE — Discharge Instructions (Signed)
Ocrelizumab injection °What is this medicine? °OCRELIZUMAB (ok re LIZ ue mab) treats multiple sclerosis. It helps to decrease the number of multiple sclerosis relapses. It is not a cure. °This medicine may be used for other purposes; ask your health care provider or pharmacist if you have questions. °COMMON BRAND NAME(S): OCREVUS °What should I tell my health care provider before I take this medicine? °They need to know if you have any of these conditions: °· cancer °· hepatitis B infection °· other infection (especially a virus infection such as chickenpox, cold sores, or herpes) °· an unusual or allergic reaction to ocrelizumab, other medicines, foods, dyes or preservatives °· pregnant or trying to get pregnant °· breast-feeding °How should I use this medicine? °This medicine is for infusion into a vein. It is given by a health care professional in a hospital or clinic setting. °A special MedGuide will be given to you before each treatment. Be sure to read this information carefully each time. °Talk to your pediatrician regarding the use of this medicine in children. Special care may be needed. °Overdosage: If you think you have taken too much of this medicine contact a poison control center or emergency room at once. °NOTE: This medicine is only for you. Do not share this medicine with others. °What if I miss a dose? °Keep appointments for follow-up doses as directed. It is important not to miss your dose. Call your doctor or health care professional if you are unable to keep an appointment. °What may interact with this medicine? °· alemtuzumab °· daclizumab °· dimethyl fumarate °· fingolimod °· glatiramer °· interferon beta °· live virus vaccines °· mitoxantrone °· natalizumab °· peginterferon beta °· rituximab °· steroid medicines like prednisone or cortisone °· teriflunomide °This list may not describe all possible interactions. Give your health care provider a list of all the medicines, herbs,  non-prescription drugs, or dietary supplements you use. Also tell them if you smoke, drink alcohol, or use illegal drugs. Some items may interact with your medicine. °What should I watch for while using this medicine? °Tell your doctor or healthcare professional if your symptoms do not start to get better or if they get worse. °This medicine can cause serious allergic reactions. To reduce your risk you may need to take medicine before treatment with this medicine. Take your medicine as directed. °Women should inform their doctor if they wish to become pregnant or think they might be pregnant. There is a potential for serious side effects to an unborn child. Talk to your health care professional or pharmacist for more information. Male patients should use effective birth control methods while receiving this medicine and for 6 months after the last dose. °Call your doctor or health care professional for advice if you get a fever, chills or sore throat, or other symptoms of a cold or flu. Do not treat yourself. This drug decreases your body's ability to fight infections. Try to avoid being around people who are sick. °If you have a hepatitis B infection or a history of a hepatitis B infection, talk to your doctor. The symptoms of hepatitis B may get worse if you take this medicine. °In some patients, this medicine may cause a serious brain infection that may cause death. If you have any problems seeing, thinking, speaking, walking, or standing, tell your doctor right away. If you cannot reach your doctor, urgently seek other source of medical care. °This medicine can decrease the response to a vaccine. If you need to get   vaccinated, tell your healthcare professional if you have received this medicine. Extra booster doses may be needed. Talk to your doctor to see if a different vaccination schedule is needed. °Talk to your doctor about your risk of cancer. You may be more at risk for certain types of cancers if you  take this medicine. °What side effects may I notice from receiving this medicine? °Side effects that you should report to your doctor or health care professional as soon as possible: °· allergic reactions like skin rash, itching or hives, swelling of the face, lips, or tongue °· breathing problems °· facial flushing °· fast, irregular heartbeat °· lump or soreness in the breast °· signs and symptoms of herpes such as cold sore, shingles, or genital sores °· signs and symptoms of infection like fever or chills, cough, sore throat, pain or trouble passing urine °· signs and symptoms of low blood pressure like dizziness; feeling faint or lightheaded, falls; unusually weak or tired °· signs and symptoms of progressive multifocal leukoencephalopathy (PML) like changes in vision; clumsiness; confusion; personality changes; weakness on one side of the body °· swelling of the ankles, feet, hands °Side effects that usually do not require medical attention (report these to your doctor or health care professional if they continue or are bothersome): °· back pain °· depressed mood °· diarrhea °· pain, redness, or irritation at site where injected °This list may not describe all possible side effects. Call your doctor for medical advice about side effects. You may report side effects to FDA at 1-800-FDA-1088. °Where should I keep my medicine? °This drug is given in a hospital or clinic and will not be stored at home. °NOTE: This sheet is a summary. It may not cover all possible information. If you have questions about this medicine, talk to your doctor, pharmacist, or health care provider. °© 2020 [employer]vier/Gold Standard (2018-10-17 07:41:53) ° °

## 2019-11-28 NOTE — Telephone Encounter (Signed)
Attempted to contact patient to give him Wanda's advice but voice mail box was full. Patient can be notified once he comes in for him next appointment.

## 2019-11-28 NOTE — Progress Notes (Signed)
Patient received IV Ocrevus, titrated per protocol as ordered by Shon Millet MD. Pre infusion medications -  PO Tylenol, IV Solu-medrol, IV benadryl, were given. Observed for 60 minutes post infusion. Post infusion BP was 146/87 . Patient stated he "feels fine", will recheck his BP when he gets home or drug store and will go to the ER if there is an emergency. Tolerated well, discharge instructions given, verbalized understanding. patient alert, oriented and ambulatory at the time of discharge. Patient will contact Shon Millet MD about orders for subsequent infusions.

## 2019-11-28 NOTE — Progress Notes (Signed)
PATIENT CARE CENTER NOTE  Diagnosis: Multiple Sclerosis G35   Provider: Shon Millet, DO   Procedure: Ocrevus 300 mg    Note: Patient received second 300 mg Ocrevus infusion via PIV. Pre-medications given (Tylenol, Benadryl, Solumedrol) per order. Infusion titrated per protocol. Patient tolerated infusion well. Observed patient for 1 hour post-infusion. Vital signs stable. Discharge instructions given. Patient to come back in 6 months for next infusion.  Alert, oriented and ambulatory at discharge.

## 2019-12-18 ENCOUNTER — Ambulatory Visit (INDEPENDENT_AMBULATORY_CARE_PROVIDER_SITE_OTHER): Payer: Self-pay | Admitting: Cardiology

## 2019-12-18 ENCOUNTER — Encounter: Payer: Self-pay | Admitting: Cardiology

## 2019-12-18 ENCOUNTER — Other Ambulatory Visit: Payer: Self-pay

## 2019-12-18 VITALS — BP 175/107 | HR 88 | Ht 68.0 in | Wt 329.0 lb

## 2019-12-18 DIAGNOSIS — E785 Hyperlipidemia, unspecified: Secondary | ICD-10-CM

## 2019-12-18 DIAGNOSIS — I10 Essential (primary) hypertension: Secondary | ICD-10-CM

## 2019-12-18 DIAGNOSIS — I428 Other cardiomyopathies: Secondary | ICD-10-CM

## 2019-12-18 DIAGNOSIS — I5042 Chronic combined systolic (congestive) and diastolic (congestive) heart failure: Secondary | ICD-10-CM

## 2019-12-18 NOTE — Patient Instructions (Signed)
Medication Instructions:  Your physician recommends that you continue on your current medications as directed. Please refer to the Current Medication list given to you today.  *If you need a refill on your cardiac medications before your next appointment, please call your pharmacy*  Lab Work: Your physician recommends that you return for lab work today: BMET, BNP  If you have labs (blood work) drawn today and your tests are completely normal, you will receive your results only by: Marland Kitchen MyChart Message (if you have MyChart) OR . A paper copy in the mail If you have any lab test that is abnormal or we need to change your treatment, we will call you to review the results.  Follow-Up: At [facility], you and your health needs are our priority.  As part of our continuing mission to provide you with exceptional heart care, we have created designated Provider Care Teams.  These Care Teams include your primary Cardiologist (physician) and Advanced Practice Providers (APPs -  Physician Assistants and Nurse Practitioners) who all work together to provide you with the care you need, when you need it.  Your next appointment:   3 month(s)  The format for your next appointment:   In Person  Provider:   Corine Shelter, [place]  Other Instructions Please review [facility] sheet provided today.

## 2019-12-18 NOTE — Progress Notes (Addendum)
Cardiology Office Note:    Date:  12/18/2019   ID:  Nicholas Fuse., DOB December 14, 1965, MRN [id number]  PCP:  Bing Neighbors, FNP  Cardiologist:  Nicki Guadalajara, MD  Electrophysiologist:  None   Referring MD: Bing Neighbors, FNP   No chief complaint on file.   History of Present Illness:    Nicholas Anglemyer. is a pleasant, morbidly obese 54 y.o.AA male with a hx of multiple sclerosis, hypertensive cardiomyopathy originally diagnosed in April 2018. Catheterization at that time did reveal mild RCA disease with a 30 to 50% stenosis. His ejection fraction initially was 20 to 25% with grade 3 diastolic dysfunction.   In September 2018 he presented with a CVA. Ejection fraction by echo then was 30%. Medical therapy has been hampered by compliance secondary to financial issues.  He did get on Entresto for a time and was titrated to max dose but is now back off it secondary to cost and the fact that he still doesn't have Medicaid.     I saw him in the office 11/22/2019. I added Losartan 50 mg daily and increased his Lasix to 80 mg daily.  He has a Child psychotherapist helping him with Medicaid but he doesn't have it yet.  When he does have Medicaid I would like to get a sleep study.    Past Medical History:  Diagnosis Date  . Acute CHF (congestive heart failure) (HCC) 02/10/2017  . Dyspnea   . Hypertension   . Stroke [facility])     Past Surgical History:  Procedure Laterality Date  . NO PAST SURGERIES    . RIGHT/LEFT HEART CATH AND CORONARY ANGIOGRAPHY N/A 02/12/2017   Procedure: Right/Left Heart Cath and Coronary Angiography;  Surgeon: Lyn Records, MD;  Location: [facility] INVASIVE CV LAB;  Service: Cardiovascular;  Laterality: N/A;    Current Medications: Current Meds  Medication Sig  . aspirin 81 MG tablet Take 1 tablet (81 mg total) by mouth daily.  Marland Kitchen atorvastatin (LIPITOR) 80 MG tablet TAKE 1 TABLET (80 MG TOTAL) BY MOUTH DAILY AT 6 PM.  . carvedilol (COREG) 12.5 MG tablet  TAKE 1 TABLET (12.5 MG TOTAL) BY MOUTH 2 (TWO) TIMES DAILY WITH A MEAL.  . cloNIDine (CATAPRES) 0.1 MG tablet TAKE 1 TABLET (0.1 MG TOTAL) BY MOUTH 2 (TWO) TIMES DAILY.  . furosemide (LASIX) 80 MG tablet Take 1 tablet (80 mg total) by mouth daily.  Marland Kitchen losartan (COZAAR) 50 MG tablet Take 1 tablet (50 mg total) by mouth daily.  . polyethylene glycol powder (GLYCOLAX/MIRALAX) 17 GM/SCOOP powder Take 17 g by mouth daily.  . potassium chloride SA (KLOR-CON) 20 MEQ tablet TAKE 1 TABLET BY MOUTH ONCE DAILY     Allergies:   Patient has no known allergies.   Social History   Socioeconomic History  . Marital status: Divorced    Spouse name: Not on file  . Number of children: 4  . Years of education: Not on file  . Highest education level: High school graduate  Occupational History  . Not on file  Tobacco Use  . Smoking status: Former Smoker    Packs/day: 1.00    Years: 34.00    Pack years: 34.00    Types: Cigarettes    Quit date: 06/28/2016    Years since quitting: 3.4  . Smokeless tobacco: Never Used  Substance and Sexual Activity  . Alcohol use: Not Currently    Comment: stopped in 2018  . Drug use: Yes  Comment: 02/10/2017 "experimented w/drugs; haven't done any for years"  . Sexual activity: Not Currently  Other Topics Concern  . Not on file  Social History Narrative   Lives in one story home with daughter; Left handed; some college; caffeine - occasional coffee not daily; walks 2xweek   Social Determinants of Health   Financial Resource Strain:   . Difficulty of Paying Living Expenses: Not on file  Food Insecurity:   . Worried About Programme researcher, broadcasting/film/video in the Last Year: Not on file  . Ran Out of Food in the Last Year: Not on file  Transportation Needs:   . Lack of Transportation (Medical): Not on file  . Lack of Transportation (Non-Medical): Not on file  Physical Activity:   . Days of Exercise per Week: Not on file  . Minutes of Exercise per Session: Not on file  Stress:    . Feeling of Stress : Not on file  Social Connections:   . Frequency of Communication with Friends and Family: Not on file  . Frequency of Social Gatherings with Friends and Family: Not on file  . Attends Religious Services: Not on file  . Active Member of Clubs or Organizations: Not on file  . Attends Banker Meetings: Not on file  . Marital Status: Not on file     Family History: The patient's family history includes Cardiomyopathy in his mother; Hypertension in his mother; Multiple sclerosis in his sister; Stroke in his maternal aunt.  ROS:   Please see the history of present illness.     All other systems reviewed and are negative.  EKGs/Labs/Other Studies Reviewed:    The following studies were reviewed today: Echo Sept 2018  EKG:  EKG is ordered today.  The ekg ordered today demonstrates NSR, RBBB, LAFB, LVH  Recent Labs: 06/15/2019: Platelets 273 07/06/2019: NT-Pro BNP 123 10/11/2019: Hemoglobin 13.1 11/09/2019: ALT 20; BUN 16; Creat 1.07; Potassium 3.8; Sodium 138  Recent Lipid Panel    Component Value Date/Time   CHOL 121 07/06/2019 1432   TRIG 90 07/06/2019 1432   HDL 30 (L) 07/06/2019 1432   CHOLHDL 4.0 07/06/2019 1432   CHOLHDL 4.9 07/18/2017 0558   VLDL 11 07/18/2017 0558   LDLCALC 73 07/06/2019 1432    Physical Exam:    VS:  BP (!) 175/107   Pulse 88   Ht 5\' 8"  (1.727 m)   Wt (!) 329 lb (149.2 kg)   SpO2 97%   BMI 50.02 kg/m     Wt Readings from Last 3 Encounters:  12/18/19 (!) 329 lb (149.2 kg)  11/22/19 (!) 323 lb 12.8 oz (146.9 kg)  10/09/19 (!) 310 lb (140.6 kg)     GEN: Morbidly obese AA male, well developed in no acute distress HEENT: Normal NECK: No JVD; No carotid bruits CARDIAC: RRR, no murmurs, rubs, gallops RESPIRATORY:  Clear to auscultation without rales, wheezing or rhonchi  ABDOMEN: Soft, non-tender, non-distended MUSCULOSKELETAL:  No edema; No deformity  SKIN: Warm and dry NEUROLOGIC:  Alert and oriented x  3 PSYCHIATRIC:  Normal affect   ASSESSMENT:    Chronic combined systolic and diastolic CHF (congestive heart failure) (HCC) He appears compensated despite increased weight. Treatment has been hampered by non compliance secondary to financial issues. He is in the process of getting disability and Medicaid. He did do well on Entresto max dose when he was provided it.   HTN_ initial B/P 170/100- repeat B/P by me with large cuff- 124/82  Non-ischemic cardiomyopathy ([place]) EF 30%by echo Sept 2018  Cerebral embolism with cerebral infarction Sept 2018  Morbid obesity ([place]) BMI 50- suspected sleep apnea- snoring, poor sleep, daytime fatigue, HTN, diastolic CHF, morbid obesity.  Plan sleep study once he has insurance.   Multiple sclerosis- PCP follows  PLAN:    Check BMP and BNP- f/u in 3 months- salty six diet recommended. F/U 3 months.     Medication Adjustments/Labs and Tests Ordered: Current medicines are reviewed at length with the patient today.  Concerns regarding medicines are outlined above.  Orders Placed This Encounter  Procedures  . Basic metabolic panel  . Brain natriuretic peptide  . EKG 12-Lead   No orders of the defined types were placed in this encounter.   Patient Instructions  Medication Instructions:  Your physician recommends that you continue on your current medications as directed. Please refer to the Current Medication list given to you today.  *If you need a refill on your cardiac medications before your next appointment, please call your pharmacy*  Lab Work: Your physician recommends that you return for lab work today: BMET, BNP  If you have labs (blood work) drawn today and your tests are completely normal, you will receive your results only by: Marland Kitchen MyChart Message (if you have MyChart) OR . A paper copy in the mail If you have any lab test that is abnormal or we need to change your treatment, we will call you to review the  results.  Follow-Up: At [facility], you and your health needs are our priority.  As part of our continuing mission to provide you with exceptional heart care, we have created designated Provider Care Teams.  These Care Teams include your primary Cardiologist (physician) and Advanced Practice Providers (APPs -  Physician Assistants and Nurse Practitioners) who all work together to provide you with the care you need, when you need it.  Your next appointment:   3 month(s)  The format for your next appointment:   In Person  Provider:   [name], [place]  Other Instructions Please review [facility] [place] sheet provided today.     Nicholas Form, PA-C  12/18/2019 3:45 PM    [facility]

## 2019-12-19 LAB — BASIC METABOLIC PANEL
BUN/Creatinine Ratio: 16 (ref 9–20)
BUN: 13 mg/dL (ref 6–24)
CO2: 25 mmol/L (ref 20–29)
Calcium: 9.7 mg/dL (ref 8.7–10.2)
Chloride: 104 mmol/L (ref 96–106)
Creatinine, Ser: 0.81 mg/dL (ref 0.76–1.27)
GFR calc Af Amer: 116 mL/min/{1.73_m2} (ref >59)
GFR calc non Af Amer: 100 mL/min/{1.73_m2} (ref >59)
Glucose: 183 mg/dL — ABNORMAL HIGH (ref 65–99)
Potassium: 4.5 mmol/L (ref 3.5–5.2)
Sodium: 145 mmol/L — ABNORMAL HIGH (ref 134–144)

## 2019-12-19 LAB — BRAIN NATRIURETIC PEPTIDE: BNP: 7.2 pg/mL (ref 0.0–100.0)

## 2020-01-01 ENCOUNTER — Telehealth: Payer: Self-pay | Admitting: Neurology

## 2020-01-01 NOTE — Telephone Encounter (Signed)
Patient left msg with after hours that he just did his first infusion and is needing it "redone" ? Would like to speak with the nurse. Thanks!

## 2020-01-01 NOTE — Telephone Encounter (Signed)
Left message for patient to call short stay at [facility] to arrange next date. [phone]

## 2020-01-04 ENCOUNTER — Encounter: Payer: Self-pay | Admitting: Cardiology

## 2020-02-23 ENCOUNTER — Telehealth: Payer: Self-pay

## 2020-02-23 NOTE — Telephone Encounter (Signed)
Pt daughter Rubye Oaks called states random times pt will get dizzy when getting up, also pt not walking as good & his appetite is not good. Joi states he had one injection in Feb & is due another in Aug. Concerned pt is getting worse. Please call daughter & she will get pt on phone also.

## 2020-03-01 NOTE — Telephone Encounter (Signed)
I need more information.  Dizziness can be attributed to many of his medical conditions and recommend that he be evaluated first by his PCP.  Also, I had wanted him evaluated by neurosurgery as well.  Did he see them?

## 2020-03-04 NOTE — Telephone Encounter (Signed)
Telephone

## 2020-03-04 NOTE — Telephone Encounter (Signed)
Telephone call to pt, Pt Daughter as well as the pt answered the phone Pt Dizziness,fatuge, And heaviness in his legs are getting worse.  Pt hasn't seen the Neurosurgeon yet. Pt states he didn't not know about the referral.  Phone number to [facility] Given to pt and daughter to call and check status of referral.   Pt also states he was told at his last infusion visit that he need subsequent orders for his OCervus. Advised pt and daughter I will let Dr. Everlena Cooper know.

## 2020-03-18 ENCOUNTER — Ambulatory Visit (INDEPENDENT_AMBULATORY_CARE_PROVIDER_SITE_OTHER): Payer: Self-pay | Admitting: Cardiology

## 2020-03-18 ENCOUNTER — Encounter: Payer: Self-pay | Admitting: Cardiology

## 2020-03-18 ENCOUNTER — Other Ambulatory Visit: Payer: Self-pay

## 2020-03-18 VITALS — BP 152/58 | HR 67 | Ht 68.0 in | Wt 310.8 lb

## 2020-03-18 DIAGNOSIS — I428 Other cardiomyopathies: Secondary | ICD-10-CM

## 2020-03-18 DIAGNOSIS — I5042 Chronic combined systolic (congestive) and diastolic (congestive) heart failure: Secondary | ICD-10-CM

## 2020-03-18 DIAGNOSIS — I10 Essential (primary) hypertension: Secondary | ICD-10-CM

## 2020-03-18 DIAGNOSIS — E785 Hyperlipidemia, unspecified: Secondary | ICD-10-CM

## 2020-03-18 NOTE — Progress Notes (Signed)
Cardiology Office Note:    Date:  03/18/2020   ID:  Nicholas Kelly., DOB 1966/05/12, MRN [id number]  PCP:  Bing Neighbors, FNP  Cardiologist:  Nicki Guadalajara, MD  Electrophysiologist:  None   Referring MD: Bing Neighbors, FNP   No chief complaint on file.   History of Present Illness:    Nicholas Kelly. is a 54 y.o. male with a hx of multiple sclerosis and hypertensive cardiomyopathy originally diagnosed in April 2018. Catheterization at that time did reveal mild RCA disease with a 30 to 50% stenosis. His ejection fraction initially was 20 to 25% with grade 3 diastolic dysfunction.   In September 2018 he presented with a CVA. Ejection fraction by echo then was 30%. Medical therapy has been hampered by compliance secondary to financial issues.He did get on Entresto for a time and was titrated to max dose but had to stop it secondary to cost and the fact that he still doesn't have Medicaid.    I saw him in the office 11/22/2019. I added Losartan 50 mg daily and increased his Lasix to 80 mg daily.  He has a Child psychotherapist helping him with Medicaid but he tells me today that he makes too much from disability.  I would like to get a sleep study but he can't afford self pay.  He is in the office today for routine follow up.  His weight is coming down-329 lbs Feb -310 lbs today. He denies dyspnea.  He has been compliant with his medications.    Past Medical History:  Diagnosis Date  . Acute CHF (congestive heart failure) (HCC) 02/10/2017  . Dyspnea   . Hypertension   . Stroke [facility])     Past Surgical History:  Procedure Laterality Date  . NO PAST SURGERIES    . RIGHT/LEFT HEART CATH AND CORONARY ANGIOGRAPHY N/A 02/12/2017   Procedure: Right/Left Heart Cath and Coronary Angiography;  Surgeon: Lyn Records, MD;  Location: [facility] INVASIVE CV LAB;  Service: Cardiovascular;  Laterality: N/A;    Current Medications: Current Meds  Medication Sig  . aspirin 81 MG  tablet Take 1 tablet (81 mg total) by mouth daily.  Marland Kitchen atorvastatin (LIPITOR) 80 MG tablet TAKE 1 TABLET (80 MG TOTAL) BY MOUTH DAILY AT 6 PM.  . carvedilol (COREG) 12.5 MG tablet TAKE 1 TABLET (12.5 MG TOTAL) BY MOUTH 2 (TWO) TIMES DAILY WITH A MEAL.  . cloNIDine (CATAPRES) 0.1 MG tablet TAKE 1 TABLET (0.1 MG TOTAL) BY MOUTH 2 (TWO) TIMES DAILY.  . furosemide (LASIX) 80 MG tablet Take 1 tablet (80 mg total) by mouth daily.  Marland Kitchen losartan (COZAAR) 50 MG tablet Take 1 tablet (50 mg total) by mouth daily.  . potassium chloride SA (KLOR-CON) 20 MEQ tablet TAKE 1 TABLET BY MOUTH ONCE DAILY  . [DISCONTINUED] polyethylene glycol powder (GLYCOLAX/MIRALAX) 17 GM/SCOOP powder Take 17 g by mouth daily.     Allergies:   Patient has no known allergies.   Social History   Socioeconomic History  . Marital status: Divorced    Spouse name: Not on file  . Number of children: 4  . Years of education: Not on file  . Highest education level: High school graduate  Occupational History  . Not on file  Tobacco Use  . Smoking status: Former Smoker    Packs/day: 1.00    Years: 34.00    Pack years: 34.00    Types: Cigarettes    Quit date: 06/28/2016  Years since quitting: 3.7  . Smokeless tobacco: Never Used  Substance and Sexual Activity  . Alcohol use: Not Currently    Comment: stopped in 2018  . Drug use: Yes    Comment: 02/10/2017 "experimented w/drugs; haven't done any for years"  . Sexual activity: Not Currently  Other Topics Concern  . Not on file  Social History Narrative   Lives in one story home with daughter; Left handed; some college; caffeine - occasional coffee not daily; walks 2xweek   Social Determinants of Health   Financial Resource Strain:   . Difficulty of Paying Living Expenses:   Food Insecurity:   . Worried About Charity fundraiser in the Last Year:   . Arboriculturist in the Last Year:   Transportation Needs:   . Film/video editor (Medical):   Marland Kitchen Lack of  Transportation (Non-Medical):   Physical Activity:   . Days of Exercise per Week:   . Minutes of Exercise per Session:   Stress:   . Feeling of Stress :   Social Connections:   . Frequency of Communication with Friends and Family:   . Frequency of Social Gatherings with Friends and Family:   . Attends Religious Services:   . Active Member of Clubs or Organizations:   . Attends Archivist Meetings:   Marland Kitchen Marital Status:      Family History: The patient's family history includes Cardiomyopathy in his mother; Hypertension in his mother; Multiple sclerosis in his sister; Stroke in his maternal aunt.  ROS:   Please see the history of present illness.     All other systems reviewed and are negative.  EKGs/Labs/Other Studies Reviewed:    The following studies were reviewed today: Echo 07/19/2019- Study Conclusions   - Left ventricle: The cavity size was mildly dilated. Wall  thickness was increased in a pattern of mild LVH. The estimated  ejection fraction was 30%. Diffuse hypokinesis. Doppler  parameters are consistent with abnormal left ventricular  relaxation (grade 1 diastolic dysfunction).  - Aortic valve: There was no stenosis.  - Aorta: Mildly dilated aortic root. Aortic root dimension: 39 mm  (ED).  - Mitral valve: There was no significant regurgitation.  - Right ventricle: The cavity size was normal. Systolic function  was normal.  - Pulmonary arteries: No complete TR doppler jet so unable to  estimate PA systolic pressure.  - Inferior vena cava: The vessel was normal in size. The  respirophasic diameter changes were in the normal range (>= 50%),  consistent with normal central venous pressure.   EKG:  EKG is not ordered today.  The ekg ordered 12/18/2019 demonstrates NSR- RBBB, LAFB.   Recent Labs: 06/15/2019: Platelets 273 07/06/2019: NT-Pro BNP 123 10/11/2019: Hemoglobin 13.1 11/09/2019: ALT 20 12/18/2019: BNP 7.2; BUN 13; Creatinine, Ser  0.81; Potassium 4.5; Sodium 145  Recent Lipid Panel    Component Value Date/Time   CHOL 121 07/06/2019 1432   TRIG 90 07/06/2019 1432   HDL 30 (L) 07/06/2019 1432   CHOLHDL 4.0 07/06/2019 1432   CHOLHDL 4.9 07/18/2017 0558   VLDL 11 07/18/2017 0558   LDLCALC 73 07/06/2019 1432    Physical Exam:    VS:  BP (!) 152/58   Pulse 67   Ht 5\' 8"  (1.727 m)   Wt (!) 310 lb 12.8 oz (141 kg)   SpO2 96%   BMI 47.26 kg/m     Wt Readings from Last 3 Encounters:  03/18/20 (!) 310 lb  12.8 oz (141 kg)  12/18/19 (!) 329 lb (149.2 kg)  11/22/19 (!) 323 lb 12.8 oz (146.9 kg)     GEN: Obese AA male, in no acute distress HEENT: Normal NECK: No JVD; No carotid bruits  CARDIAC: RRR, frequent pauses and extra systole no murmurs, rubs, gallops RESPIRATORY:  Clear to auscultation without rales, wheezing or rhonchi  ABDOMEN: Soft, non-tender, non-distended MUSCULOSKELETAL:  No edema; No deformity  SKIN: Warm and dry NEUROLOGIC:  Alert and oriented x 3 PSYCHIATRIC:  Normal affect   ASSESSMENT:    Chronic combined systolic and diastolic CHF (congestive heart failure) (HCC) He appears compensated.Treatment has been hampered by non compliance secondary to financial issues.   HTN_ initial B/P 158/88 repeat B/P by me with large cuff- 110/70 but ectopy made accurate reading difficult  Non-ischemic cardiomyopathy (HCC) EF 30%by echo Sept 2018  Cerebral embolism with cerebral infarction Sept 2018  Morbid obesity (HCC) BMI 50- suspected sleep apnea- snoring, poor sleep, daytime fatigue, HTN, diastolic CHF, morbid obesity.  Plan sleep study once he has insurance.   Multiple sclerosis- PCP follows  PLAN:    Check BMP today- adjust medications if needed.  I'll look into assistance for a sleep study.  F/U in 3 months.    Medication Adjustments/Labs and Tests Ordered: Current medicines are reviewed at length with the patient today.  Concerns regarding medicines are outlined above.  No  orders of the defined types were placed in this encounter.  No orders of the defined types were placed in this encounter.   There are no Patient Instructions on file for this visit.   Signed, Corine Shelter, PA-C  03/18/2020 4:22 PM    [facility]

## 2020-03-18 NOTE — Patient Instructions (Signed)
Medication Instructions:  CONTINUE WITH CURRENT MEDICATIONS. NO CHANGES.  *If you need a refill on your cardiac medications before your next appointment, please call your pharmacy*   Lab Work: [facility] If you have labs (blood work) drawn today and your tests are completely normal, you will receive your results only by: Marland Kitchen MyChart Message (if you have MyChart) OR . A paper copy in the mail If you have any lab test that is abnormal or we need to change your treatment, we will call you to review the results.  Follow-Up: At [facility], you and your health needs are our priority.  As part of our continuing mission to provide you with exceptional heart care, we have created designated Provider Care Teams.  These Care Teams include your primary Cardiologist (physician) and Advanced Practice Providers (APPs -  Physician Assistants and Nurse Practitioners) who all work together to provide you with the care you need, when you need it.  We recommend signing up for the patient portal called "MyChart".  Sign up information is provided on this After Visit Summary.  MyChart is used to connect with patients for Virtual Visits (Telemedicine).  Patients are able to view lab/test results, encounter notes, upcoming appointments, etc.  Non-urgent messages can be sent to your provider as well.   To learn more about what you can do with MyChart, go to [email or web address].    Your next appointment:   3 month(s)  The format for your next appointment:   In Person  Provider:   Corine Shelter

## 2020-03-19 LAB — BASIC METABOLIC PANEL
BUN/Creatinine Ratio: 14 (ref 9–20)
BUN: 18 mg/dL (ref 6–24)
CO2: 20 mmol/L (ref 20–29)
Calcium: 9.8 mg/dL (ref 8.7–10.2)
Chloride: 96 mmol/L (ref 96–106)
Creatinine, Ser: 1.32 mg/dL — ABNORMAL HIGH (ref 0.76–1.27)
GFR calc Af Amer: 70 mL/min/{1.73_m2} (ref >59)
GFR calc non Af Amer: 60 mL/min/{1.73_m2} (ref >59)
Glucose: 539 mg/dL (ref 65–99)
Potassium: 4.8 mmol/L (ref 3.5–5.2)
Sodium: 136 mmol/L (ref 134–144)

## 2020-03-22 ENCOUNTER — Telehealth: Payer: Self-pay | Admitting: Cardiology

## 2020-03-22 NOTE — Telephone Encounter (Signed)
New message   Patient's daughter is returning call for lab results. Please call. 

## 2020-03-22 NOTE — Telephone Encounter (Signed)
Reviewed with daughter and patient. Patient awaiting call from PCP's office to address results of recent lab work.

## 2020-03-26 ENCOUNTER — Telehealth (INDEPENDENT_AMBULATORY_CARE_PROVIDER_SITE_OTHER): Payer: Self-pay | Admitting: Internal Medicine

## 2020-03-26 ENCOUNTER — Encounter: Payer: Self-pay | Admitting: Internal Medicine

## 2020-03-26 DIAGNOSIS — Z1211 Encounter for screening for malignant neoplasm of colon: Secondary | ICD-10-CM

## 2020-03-26 DIAGNOSIS — R739 Hyperglycemia, unspecified: Secondary | ICD-10-CM

## 2020-03-26 NOTE — Progress Notes (Signed)
Virtual Visit via Telephone Note  I connected with Nicholas Kelly., on 03/26/2020 at 2:59 PM by telephone due to the COVID-19 pandemic and verified that I am speaking with the correct person using two identifiers.   Consent: I discussed the limitations, risks, security and privacy concerns of performing an evaluation and management service by telephone and the availability of in person appointments. I also discussed with the patient that there may be a patient responsible charge related to this service. The patient expressed understanding and agreed to proceed.   Location of Patient: Home   Location of Provider: Clinic    Persons participating in Telemedicine visit: Nicholas Kelly Nicholas Kelly, patient's daughter    History of Present Illness: Patient has a visit due to hyperglycemia that was discovered on BMP at recent cardiology visit on 5/24. Glucose was 539 on BMP. Thinks he ate prior to that visit. No history of DM. Denies polyuria, polydipsia, nausea, vomiting. Reports mother had DM.     Past Medical History:  Diagnosis Date   Acute CHF (congestive heart failure) ([place]) 02/10/2017   Dyspnea    Hypertension    Stroke ([place])    No Known Allergies  Current Outpatient Medications on File Prior to Visit  Medication Sig Dispense Refill   aspirin 81 MG tablet Take 1 tablet (81 mg total) by mouth daily. 30 tablet 3   atorvastatin (LIPITOR) 80 MG tablet TAKE 1 TABLET (80 MG TOTAL) BY MOUTH DAILY AT 6 PM. 30 tablet 10   carvedilol (COREG) 12.5 MG tablet TAKE 1 TABLET (12.5 MG TOTAL) BY MOUTH 2 (TWO) TIMES DAILY WITH A MEAL. 60 tablet 10   cloNIDine (CATAPRES) 0.1 MG tablet TAKE 1 TABLET (0.1 MG TOTAL) BY MOUTH 2 (TWO) TIMES DAILY. 60 tablet 10   furosemide (LASIX) 80 MG tablet Take 1 tablet (80 mg total) by mouth daily. 30 tablet 5   losartan (COZAAR) 50 MG tablet Take 1 tablet (50 mg total) by mouth daily. 30 tablet 5   potassium  chloride SA (KLOR-CON) 20 MEQ tablet TAKE 1 TABLET BY MOUTH ONCE DAILY 30 tablet 10   No current facility-administered medications on file prior to visit.    Observations/Objective: NAD. Speaking clearly.  Work of breathing normal.  Alert and oriented. Mood appropriate.   Assessment and Plan: 1. Hyperglycemia Glucose significantly elevated at 539. In that past has had hyperglycemia on BMETs, but not anywhere near as elevated. Will repeat and check A1c to test for DM. Patient is asymptomatic.  - Basic metabolic panel; Future - Hemoglobin A1c; Future  2. Colon cancer screening Discussed with patient that were unable to to test specimen provided in Sept due to age of sample. Will repeat screening.  - Fecal occult blood, imunochemical([facility]); Future   Follow Up Instructions: Lab visit 6/2    I discussed the assessment and treatment plan with the patient. The patient was provided an opportunity to ask questions and all were answered. The patient agreed with the plan and demonstrated an understanding of the instructions.   The patient was advised to call back or seek an in-person evaluation if the symptoms worsen or if the condition fails to improve as anticipated.     I provided 14 minutes total of non-face-to-face time during this encounter including median intraservice time, reviewing previous notes, investigations, ordering medications, medical decision making, coordinating care and patient verbalized understanding at the end of the visit.    Phill Myron, D.O.  Primary Care at [facility]  03/26/2020, 2:59 PM

## 2020-03-27 ENCOUNTER — Other Ambulatory Visit: Payer: Self-pay

## 2020-03-27 DIAGNOSIS — R739 Hyperglycemia, unspecified: Secondary | ICD-10-CM

## 2020-03-28 ENCOUNTER — Encounter: Payer: Self-pay | Admitting: Internal Medicine

## 2020-03-28 DIAGNOSIS — IMO0002 Reserved for concepts with insufficient information to code with codable children: Secondary | ICD-10-CM | POA: Insufficient documentation

## 2020-03-28 LAB — BASIC METABOLIC PANEL
BUN/Creatinine Ratio: 17 (ref 9–20)
BUN: 18 mg/dL (ref 6–24)
CO2: 24 mmol/L (ref 20–29)
Calcium: 9.2 mg/dL (ref 8.7–10.2)
Chloride: 99 mmol/L (ref 96–106)
Creatinine, Ser: 1.04 mg/dL (ref 0.76–1.27)
GFR calc Af Amer: 93 mL/min/{1.73_m2} (ref >59)
GFR calc non Af Amer: 80 mL/min/{1.73_m2} (ref >59)
Glucose: 392 mg/dL — ABNORMAL HIGH (ref 65–99)
Potassium: 4.7 mmol/L (ref 3.5–5.2)
Sodium: 136 mmol/L (ref 134–144)

## 2020-03-28 LAB — HEMOGLOBIN A1C
Est. average glucose Bld gHb Est-mCnc: 349 mg/dL
Hgb A1c MFr Bld: 13.8 % — ABNORMAL HIGH (ref 4.8–5.6)

## 2020-04-05 ENCOUNTER — Telehealth: Payer: Self-pay | Admitting: *Deleted

## 2020-04-05 NOTE — Telephone Encounter (Signed)
Called [email or web address] to discuss a home sleep study which is cheaper than a in lab study.

## 2020-04-05 NOTE — Telephone Encounter (Signed)
-----   Message from Abelino Derrick, [place] sent at 03/18/2020  4:33 PM EDT ----- Any chance on getting this self pay patient a sleep study for cheap ?LUKE KILROY PA-[id number] PM

## 2020-04-08 NOTE — Telephone Encounter (Signed)
Per answering service patient returned call.

## 2020-04-09 NOTE — Progress Notes (Signed)
Hayley please let Mr Frann Rider know a home sleep study would be 600-800 dollars without insurnace.  Corine Shelter PA-C 04/09/2020 9:59 AM

## 2020-04-17 ENCOUNTER — Telehealth: Payer: Self-pay

## 2020-04-17 NOTE — Telephone Encounter (Signed)
Called to follow up with patient regarding the cost of a home sleep study. Without insurance the cost is $6-800.  Patient stated that they are trying to obtain insurance and would get back with doctor.  Care Guide extending offer to assist patient with finding resources that may help cover some of the cost of the study. Care Guide will follow up with patient on the options available by 04/17/20.

## 2020-04-18 ENCOUNTER — Encounter: Payer: Self-pay | Admitting: *Deleted

## 2020-04-18 NOTE — Telephone Encounter (Deleted)
-----   Message from Abelino Derrick, [place] sent at 04/09/2020  9:58 AM EDT -----   ----- Message ----- From: Gaynelle Cage, CMA Sent: 04/05/2020   5:17 PM EDT To: Abelino Derrick, PA-C  He can get a HST for about $600-$800. ----- Message ----- From: Lorin Mercy Sent: 03/18/2020   4:33 PM EDT To: Gaynelle Cage, CMA  Any chance on getting this self pay patient a sleep study for cheap ?  Corine Shelter PA-C 03/18/2020 4:32 PM

## 2020-04-18 NOTE — Telephone Encounter (Signed)
This encounter was created in error - please disregard.

## 2020-04-22 NOTE — Telephone Encounter (Signed)
Called patient lmtcb on  Cell.

## 2020-04-24 ENCOUNTER — Telehealth: Payer: Self-pay

## 2020-04-24 DIAGNOSIS — Z789 Other specified health status: Secondary | ICD-10-CM

## 2020-04-24 NOTE — Telephone Encounter (Signed)
Contacted patient to update him on the financial options to help with paying for a home sleep study. Patient is interested in assistance with applying for insurance coverage to help cover the cost of the study. Will reach out to the LCSW toget assistance with applying for insurance.  Care Guide routed a message to Drs. Diona Fanti and Tresa Endo to inquire about sleep study options that are more affordable.

## 2020-04-24 NOTE — Progress Notes (Signed)
Thanks Amy- will see if Burna Mortimer can come up with something.  Corine Shelter PA-C 04/24/2020 2:35 PM

## 2020-04-25 ENCOUNTER — Telehealth: Payer: Self-pay | Admitting: Licensed Clinical Social Worker

## 2020-04-25 NOTE — Telephone Encounter (Signed)
CSW spoke with patient who states he has been receiving Social Security disability since February 2021. CSW explained that patient could explore options for insurance through the [facility] and he may be eligible for a subsidy. CSW also discussed [employer] Program as another option but to explore the AHA first and return call to CSW. Patient verbalize understanding of follow up and will return call to CSW. Lasandra Beech, LCSW, CCSW-MCS [phone]

## 2020-04-30 NOTE — Progress Notes (Signed)
Patient notified of results & recommendations. Expressed understanding.  Made an appointment with provider to discuss DM management on 05/14/2020 @ 8:30 am.  Patient states that he is on a lot of medications & would like to have A1C repeated before starting any additional meds. Made a lab appointment for 05/01/2020 @ 9:30 AM.

## 2020-05-01 ENCOUNTER — Other Ambulatory Visit (INDEPENDENT_AMBULATORY_CARE_PROVIDER_SITE_OTHER): Payer: Self-pay

## 2020-05-01 ENCOUNTER — Other Ambulatory Visit: Payer: Self-pay

## 2020-05-01 DIAGNOSIS — R739 Hyperglycemia, unspecified: Secondary | ICD-10-CM

## 2020-05-02 LAB — HEMOGLOBIN A1C
Est. average glucose Bld gHb Est-mCnc: 335 mg/dL
Hgb A1c MFr Bld: 13.3 % — ABNORMAL HIGH (ref 4.8–5.6)

## 2020-05-02 NOTE — Progress Notes (Signed)
Patient notified of results & recommendations. Has already made an appointment to discuss Diabetes management with provider on 05/14/2020.

## 2020-05-14 ENCOUNTER — Telehealth: Payer: Self-pay | Admitting: Internal Medicine

## 2020-05-17 ENCOUNTER — Encounter: Payer: Self-pay | Admitting: Internal Medicine

## 2020-05-17 ENCOUNTER — Telehealth (INDEPENDENT_AMBULATORY_CARE_PROVIDER_SITE_OTHER): Payer: Self-pay | Admitting: Internal Medicine

## 2020-05-17 DIAGNOSIS — E1165 Type 2 diabetes mellitus with hyperglycemia: Secondary | ICD-10-CM

## 2020-05-17 MED ORDER — METFORMIN HCL 500 MG PO TABS: ORAL_TABLET | ORAL | 3 refills | Status: DC

## 2020-05-17 NOTE — Progress Notes (Signed)
Virtual Visit via Telephone Note  I connected with Nicholas Kelly., on 05/17/2020 at 9:50 AM by telephone due to the COVID-19 pandemic and verified that I am speaking with the correct person using two identifiers.   Consent: I discussed the limitations, risks, security and privacy concerns of performing an evaluation and management service by telephone and the availability of in person appointments. I also discussed with the patient that there may be a patient responsible charge related to this service. The patient expressed understanding and agreed to proceed.   Location of Patient: Home   Location of Provider: Clinic    Persons participating in Telemedicine visit: Nicholas Kelly [facility] Dr. Earlene Plater    History of Present Illness: Patient has a visit to follow up on new diagnosis of DM. Patient has questions about what the best diet is for his DM and how he can best prevent this from worsening. He has cut soda completely out of his diet.    Past Medical History:  Diagnosis Date  . Acute CHF (congestive heart failure) (HCC) 02/10/2017  . Dyspnea   . Hypertension   . Stroke [facility])    No Known Allergies  Current Outpatient Medications on File Prior to Visit  Medication Sig Dispense Refill  . aspirin 81 MG tablet Take 1 tablet (81 mg total) by mouth daily. 30 tablet 3  . atorvastatin (LIPITOR) 80 MG tablet TAKE 1 TABLET (80 MG TOTAL) BY MOUTH DAILY AT 6 PM. 30 tablet 10  . carvedilol (COREG) 12.5 MG tablet TAKE 1 TABLET (12.5 MG TOTAL) BY MOUTH 2 (TWO) TIMES DAILY WITH A MEAL. 60 tablet 10  . cloNIDine (CATAPRES) 0.1 MG tablet TAKE 1 TABLET (0.1 MG TOTAL) BY MOUTH 2 (TWO) TIMES DAILY. 60 tablet 10  . furosemide (LASIX) 80 MG tablet Take 1 tablet (80 mg total) by mouth daily. 30 tablet 5  . losartan (COZAAR) 50 MG tablet Take 1 tablet (50 mg total) by mouth daily. 30 tablet 5  . potassium chloride SA (KLOR-CON) 20 MEQ tablet TAKE 1 TABLET BY MOUTH ONCE DAILY  30 tablet 10   No current facility-administered medications on file prior to visit.    Observations/Objective: NAD. Speaking clearly.  Work of breathing normal.  Alert and oriented. Mood appropriate.   Assessment and Plan: 1. Uncontrolled type 2 diabetes mellitus with hyperglycemia [facility]) Patient originally had ED f/u for hyperglycemia. A1c obtained and was 13.8. Patient wanted to have it repeated prior to discussing any therapy. Was repeated and result was 13.3. Today, we discussed the nature of the disease process and how when glucose is uncontrolled it can have multi organ impact. Discussed carb modified diet and importance of physical activity. Will start Metformin 500 mg BID. Increase to 1000 mg BID if tolerating. Discussed potential GI side effects. Will refer to [facility] PharmD for further education and management. Discussed that may warrant additional medication beyond Metformin. Patient is already on Arb and statin therapy.  - metFORMIN (GLUCOPHAGE) 500 MG tablet; Take one tablet twice per day. If tolerating in two weeks, increase to two tablets twice per day.  Dispense: 180 tablet; Refill: 3   Follow Up Instructions: PharmD 7/30; 2 month f/u with PCP for repeat A1c    I discussed the assessment and treatment plan with the patient. The patient was provided an opportunity to ask questions and all were answered. The patient agreed with the plan and demonstrated an understanding of the instructions.   The patient was  advised to call back or seek an in-person evaluation if the symptoms worsen or if the condition fails to improve as anticipated.     I provided 26 minutes total of non-face-to-face time during this encounter including median intraservice time, reviewing previous notes, investigations, ordering medications, medical decision making, coordinating care and patient verbalized understanding at the end of the visit.    Marcy Siren, D.O. Primary Care at [facility]   05/17/2020, 9:50 AM

## 2020-05-24 ENCOUNTER — Other Ambulatory Visit: Payer: Self-pay

## 2020-05-24 ENCOUNTER — Encounter: Payer: Self-pay | Admitting: Pharmacist

## 2020-05-24 ENCOUNTER — Ambulatory Visit: Payer: Self-pay | Attending: Family Medicine | Admitting: Pharmacist

## 2020-05-24 ENCOUNTER — Telehealth: Payer: Self-pay | Admitting: Neurology

## 2020-05-24 DIAGNOSIS — E1165 Type 2 diabetes mellitus with hyperglycemia: Secondary | ICD-10-CM

## 2020-05-24 DIAGNOSIS — G35 Multiple sclerosis: Secondary | ICD-10-CM

## 2020-05-24 MED ORDER — OCRELIZUMAB 300 MG/10ML IV SOLN: 300.0000 mg | Freq: Once | INTRAVENOUS | 0 refills | Status: DC

## 2020-05-24 MED ORDER — DAPAGLIFLOZIN PROPANEDIOL 10 MG PO TABS: 10.0000 mg | ORAL_TABLET | Freq: Every day | ORAL | 2 refills | Status: DC

## 2020-05-24 MED ORDER — DIPHENHYDRAMINE HCL 50 MG/ML IJ SOLN: 25.0000 mg | Freq: Once | INTRAMUSCULAR | Status: DC

## 2020-05-24 MED ORDER — TRUEPLUS LANCETS 28G MISC: 6 refills | Status: DC

## 2020-05-24 MED ORDER — GLUCOSE BLOOD VI STRP: ORAL_STRIP | 6 refills | Status: DC

## 2020-05-24 MED ORDER — TRUE METRIX METER W/DEVICE KIT: PACK | 0 refills | Status: AC

## 2020-05-24 MED ORDER — SODIUM CHLORIDE 0.9 % IV SOLN: 100.0000 mg | Freq: Once | INTRAVENOUS | Status: DC

## 2020-05-24 MED ORDER — ACETAMINOPHEN 325 MG PO TABS: 650.0000 mg | ORAL_TABLET | Freq: Once | ORAL | Status: DC

## 2020-05-24 MED ORDER — SODIUM CHLORIDE 0.9 % IV SOLN: 600.0000 mg | Freq: Once | INTRAVENOUS | 0 refills | Status: AC

## 2020-05-24 MED ORDER — METFORMIN HCL 500 MG PO TABS: 1000.0000 mg | ORAL_TABLET | Freq: Two times a day (BID) | ORAL | 2 refills | Status: DC

## 2020-05-24 NOTE — Telephone Encounter (Signed)
[facility] called in needing an order for this patient's Ocrevus. He is coming in today to get it injected. They need the order, how often, any premeds, and anything else sent to them through epic or faxed to [phone]

## 2020-05-24 NOTE — Telephone Encounter (Signed)
Entered pre-meds as clinic administered but could only enter Ocrevus as outpatient so printed and faxed signed order to [facility] [phone].   Ocrevus 600 mg IV Tylenol 650 mg PO Solumedrol 100 mg IV Benadryl 25 mg IV

## 2020-05-24 NOTE — Progress Notes (Signed)
    S:    PCP: Dr. Earlene Plater  No chief complaint on file.  Patient arrives in good spirits. Presents for diabetes evaluation, education, and management.  Patient was referred and last seen by Primary Care Provider on 05/17/2020.    Patient reports Diabetes was diagnosed ~1 month ago. Pt with no hx of pancreatitis. No fhx or personal history of thyroid cancer. Pt has only been on metformin. No injectables or orals other than metformin.   Family/Social History:  - Tobacco: former smoker (quit in 2017) -Alcohol: denies use   Insurance coverage/medication affordability: self pay  Medication adherence reported.   Current diabetes medications include: metformin 500 mg BID Current hypertension medications include: carvedilol 12.5 mg BID, clonidine 0.1 mg BID, furosemide 80 mg daily, losartan 50 mg daily  Current hyperlipidemia medications include: atorvastatin 80 mg daily   Patient denies hypoglycemic events.  Patient reported dietary habits:  - reports eliminating soda, sugary drinks and desserts - struggles with carb intake  Patient-reported exercise habits: none    Patient denies nocturia (nighttime urination).  Patient reports neuropathy (nerve pain). Patient denies visual changes. Patient reports self foot exams.     O:   Lab Results  Component Value Date   HGBA1C 13.3 (H) 05/01/2020   There were no vitals filed for this visit.  Lipid Panel     Component Value Date/Time   CHOL 121 07/06/2019 1432   TRIG 90 07/06/2019 1432   HDL 30 (L) 07/06/2019 1432   CHOLHDL 4.0 07/06/2019 1432   CHOLHDL 4.9 07/18/2017 0558   VLDL 11 07/18/2017 0558   LDLCALC 73 07/06/2019 1432    Home fasting blood sugars: not checking  2 hour post-meal/random blood sugars: not checking.   Clinical Atherosclerotic Cardiovascular Disease (ASCVD): Yes  The ASCVD Risk score Denman George DC Jr., et al., 2013) failed to calculate for the following reasons:   The patient has a prior MI or stroke  diagnosis    A/P: Diabetes newly dx currently uncontrolled. Patient is able to verbalize appropriate hypoglycemia management plan. Medication adherence reported. Control is suboptimal due to dietary indiscretion and dietary indiscretion.   Pt with hx of stroke and CHF. Recommend SGLT-2 inh; pt is amenable to [place]. Would like to proceed with oral therapy and dietary modification before adding an injectable. In the future, I recommend GLP-1 RA such as Trulicity or Ozempic if improvement of glycemic control is needed.   -Increased dose of metformin 1000 mg BID.  -Start Farxiga 10 mg daily. Counseled on sick day rules. -True [employer] supplies sent; Counseled on technique.  -Recommend GLP-1 RA in the future. A1c is compelling for injectable therapy.  -Extensively discussed pathophysiology of diabetes, recommended lifestyle interventions, dietary effects on blood sugar control -Counseled on s/sx of and management of hypoglycemia -Next A1C anticipated 07/2020.   ASCVD risk - secondary prevention in patient with diabetes. Last LDL is not controlled. Recommend goal LDL < 70. high intensity statin indicated. May benefit from addition of Zetia in the future. Aspirin is indicated.  -Continued aspirin 81 mg  -Continued atorvastatin 80 mg.   Written patient instructions provided. Total time in face to face counseling 30 minutes.   Follow up Pharmacist Clinic Visit in 1 month.   Butch Penny, PharmD, CPP Clinical Pharmacist [facility] [phone]

## 2020-05-24 NOTE — Telephone Encounter (Signed)
Informed patient of sleep study cost and patient understanding was verbalized. Patient understands a Home sleep Study cost $700 and an in lab study cost $3,600. Patient states he can not afford that this time but he is trying to get some insurance for his-self.

## 2020-05-27 ENCOUNTER — Other Ambulatory Visit: Payer: Self-pay

## 2020-05-27 ENCOUNTER — Telehealth: Payer: Self-pay | Admitting: Licensed Clinical Social Worker

## 2020-05-27 ENCOUNTER — Inpatient Hospital Stay (HOSPITAL_COMMUNITY): Admission: RE | Admit: 2020-05-27 | Payer: Self-pay | Source: Ambulatory Visit

## 2020-05-27 NOTE — Telephone Encounter (Signed)
CSW contacted patient to follow up on insurance. Patient provided information on last call on AHA and [facility] Program for possible options. Patient reports he has not made any calls or follow up with options provided. CSW confirmed that patient still has the numbers for follow up. Patient states he will return call to CSW if needed. Lasandra Beech, LCSW, CCSW-MCS [phone]

## 2020-06-12 ENCOUNTER — Ambulatory Visit (HOSPITAL_COMMUNITY)
Admission: RE | Admit: 2020-06-12 | Discharge: 2020-06-12 | Disposition: A | Discharge status: Home / Self Care | Payer: Self-pay | Source: Ambulatory Visit | Attending: Internal Medicine | Admitting: Internal Medicine

## 2020-06-12 ENCOUNTER — Other Ambulatory Visit: Payer: Self-pay

## 2020-06-12 DIAGNOSIS — G35 Multiple sclerosis: Secondary | ICD-10-CM | POA: Insufficient documentation

## 2020-06-12 MED ORDER — SODIUM CHLORIDE 0.9 % IV SOLN
600.0000 mg | INTRAVENOUS | Status: DC
  Administered 2020-06-12: 600 mg via INTRAVENOUS
  Filled 2020-06-12: qty 20

## 2020-06-12 MED ORDER — SODIUM CHLORIDE 0.9 % IV SOLN
25.0000 mg | Freq: Once | INTRAVENOUS | Status: AC
  Administered 2020-06-12: 25 mg via INTRAVENOUS
  Filled 2020-06-12: qty 25

## 2020-06-12 MED ORDER — METHYLPREDNISOLONE SODIUM SUCC 125 MG IJ SOLR
100.0000 mg | Freq: Once | INTRAMUSCULAR | Status: AC
  Administered 2020-06-12: 100 mg via INTRAVENOUS
  Filled 2020-06-12: qty 2

## 2020-06-12 MED ORDER — ACETAMINOPHEN 325 MG PO TABS
650.0000 mg | ORAL_TABLET | Freq: Once | ORAL | Status: AC
  Administered 2020-06-12: 650 mg via ORAL
  Filled 2020-06-12: qty 2

## 2020-06-12 MED ORDER — SODIUM CHLORIDE 0.9 % IV SOLN
INTRAVENOUS | Status: DC | PRN
  Administered 2020-06-12: 250 mL via INTRAVENOUS

## 2020-06-12 NOTE — Discharge Instructions (Signed)
Ocrelizumab injection °What is this medicine? °OCRELIZUMAB (ok re LIZ ue mab) treats multiple sclerosis. It helps to decrease the number of multiple sclerosis relapses. It is not a cure. °This medicine may be used for other purposes; ask your health care provider or pharmacist if you have questions. °COMMON BRAND NAME(S): OCREVUS °What should I tell my health care provider before I take this medicine? °They need to know if you have any of these conditions: °· cancer °· hepatitis B infection °· other infection (especially a virus infection such as chickenpox, cold sores, or herpes) °· an unusual or allergic reaction to ocrelizumab, other medicines, foods, dyes or preservatives °· pregnant or trying to get pregnant °· breast-feeding °How should I use this medicine? °This medicine is for infusion into a vein. It is given by a health care professional in a hospital or clinic setting. °A special MedGuide will be given to you before each treatment. Be sure to read this information carefully each time. °Talk to your pediatrician regarding the use of this medicine in children. Special care may be needed. °Overdosage: If you think you have taken too much of this medicine contact a poison control center or emergency room at once. °NOTE: This medicine is only for you. Do not share this medicine with others. °What if I miss a dose? °Keep appointments for follow-up doses as directed. It is important not to miss your dose. Call your doctor or health care professional if you are unable to keep an appointment. °What may interact with this medicine? °· alemtuzumab °· daclizumab °· dimethyl fumarate °· fingolimod °· glatiramer °· interferon beta °· live virus vaccines °· mitoxantrone °· natalizumab °· peginterferon beta °· rituximab °· steroid medicines like prednisone or cortisone °· teriflunomide °This list may not describe all possible interactions. Give your health care provider a list of all the medicines, herbs,  non-prescription drugs, or dietary supplements you use. Also tell them if you smoke, drink alcohol, or use illegal drugs. Some items may interact with your medicine. °What should I watch for while using this medicine? °Tell your doctor or healthcare professional if your symptoms do not start to get better or if they get worse. °This medicine can cause serious allergic reactions. To reduce your risk you may need to take medicine before treatment with this medicine. Take your medicine as directed. °Women should inform their doctor if they wish to become pregnant or think they might be pregnant. There is a potential for serious side effects to an unborn child. Talk to your health care professional or pharmacist for more information. Male patients should use effective birth control methods while receiving this medicine and for 6 months after the last dose. °Call your doctor or health care professional for advice if you get a fever, chills or sore throat, or other symptoms of a cold or flu. Do not treat yourself. This drug decreases your body's ability to fight infections. Try to avoid being around people who are sick. °If you have a hepatitis B infection or a history of a hepatitis B infection, talk to your doctor. The symptoms of hepatitis B may get worse if you take this medicine. °In some patients, this medicine may cause a serious brain infection that may cause death. If you have any problems seeing, thinking, speaking, walking, or standing, tell your doctor right away. If you cannot reach your doctor, urgently seek other source of medical care. °This medicine can decrease the response to a vaccine. If you need to get   vaccinated, tell your healthcare professional if you have received this medicine. Extra booster doses may be needed. Talk to your doctor to see if a different vaccination schedule is needed. °Talk to your doctor about your risk of cancer. You may be more at risk for certain types of cancers if you  take this medicine. °What side effects may I notice from receiving this medicine? °Side effects that you should report to your doctor or health care professional as soon as possible: °· allergic reactions like skin rash, itching or hives, swelling of the face, lips, or tongue °· breathing problems °· facial flushing °· fast, irregular heartbeat °· lump or soreness in the breast °· signs and symptoms of herpes such as cold sore, shingles, or genital sores °· signs and symptoms of infection like fever or chills, cough, sore throat, pain or trouble passing urine °· signs and symptoms of low blood pressure like dizziness; feeling faint or lightheaded, falls; unusually weak or tired °· signs and symptoms of progressive multifocal leukoencephalopathy (PML) like changes in vision; clumsiness; confusion; personality changes; weakness on one side of the body °· swelling of the ankles, feet, hands °Side effects that usually do not require medical attention (report these to your doctor or health care professional if they continue or are bothersome): °· back pain °· depressed mood °· diarrhea °· pain, redness, or irritation at site where injected °This list may not describe all possible side effects. Call your doctor for medical advice about side effects. You may report side effects to FDA at 1-800-FDA-1088. °Where should I keep my medicine? °This drug is given in a hospital or clinic and will not be stored at home. °NOTE: This sheet is a summary. It may not cover all possible information. If you have questions about this medicine, talk to your doctor, pharmacist, or health care provider. °© 2020 [employer]vier/Gold Standard (2018-10-17 07:41:53) ° °

## 2020-06-12 NOTE — Progress Notes (Signed)
Patient received  IV Ocrevus as ordered by Shon Millet MD. Patient waited for about 45 minutes post infusion stating his "ride" was here. Pre infusion medications were given per order. Tolerated well, vitals stable, discharge instructions given, verbalized understanding. Patient alert, oriented and ambulatory at the time of discharge.

## 2020-06-21 ENCOUNTER — Ambulatory Visit: Payer: Self-pay | Admitting: Pharmacist

## 2020-06-25 ENCOUNTER — Ambulatory Visit: Payer: Self-pay | Admitting: Pharmacist

## 2020-06-27 ENCOUNTER — Encounter: Payer: Self-pay | Admitting: Cardiology

## 2020-06-27 ENCOUNTER — Other Ambulatory Visit: Payer: Self-pay

## 2020-06-27 ENCOUNTER — Ambulatory Visit (INDEPENDENT_AMBULATORY_CARE_PROVIDER_SITE_OTHER): Payer: Self-pay | Admitting: Cardiology

## 2020-06-27 VITALS — BP 140/76 | HR 78 | Ht 68.0 in | Wt 298.0 lb

## 2020-06-27 DIAGNOSIS — I5042 Chronic combined systolic (congestive) and diastolic (congestive) heart failure: Secondary | ICD-10-CM

## 2020-06-27 NOTE — Progress Notes (Signed)
Cardiology Office Note:    Date:  06/27/2020   ID:  Nicholas Fuse., DOB 07/15/1966, MRN [id number]  PCP:  Arvilla Market, DO  Cardiologist:  Nicki Guadalajara, MD  Electrophysiologist:  None   Referring MD: Bing Neighbors, FNP   No chief complaint on file.   History of Present Illness:    Nicholas Okray. is a pleasant 54 y.o.AA male with a hx of multiple sclerosis andhypertensive cardiomyopathy originally diagnosed in April 2018. Catheterization at that time did reveal mild RCA disease with a 30 to 50% stenosis. His ejection fraction initially was 20 to 25% with grade 3 diastolic dysfunction.   In September 2018 he presented with a CVA. Ejection fraction by echo then was 30%. Medical therapy has been hampered by compliance secondary to financial issues.He did get on Entresto for a time and was titrated to max dose but had to stop it secondary to cost and the fact that he still doesn't have Medicaid.  I saw him in the office 11/22/2019. I added Losartan 50 mg daily and increased his Lasix to 80 mg daily. He has a Child psychotherapist helping him with Medicaid but he tells me today that he makes too much from disability.  I would like to get a sleep study but he can't afford self pay.  With the help of Merryl Hacker Social worker he has managed to stay complaint with his medications. He continues to loose weight- down to 299 lbs today.  He denies any unusual dyspnea or tachycardia.     Past Medical History:  Diagnosis Date  . Acute CHF (congestive heart failure) (HCC) 02/10/2017  . Dyspnea   . Hypertension   . Stroke [facility])     Past Surgical History:  Procedure Laterality Date  . NO PAST SURGERIES    . RIGHT/LEFT HEART CATH AND CORONARY ANGIOGRAPHY N/A 02/12/2017   Procedure: Right/Left Heart Cath and Coronary Angiography;  Surgeon: Lyn Records, MD;  Location: [facility] INVASIVE CV LAB;  Service: Cardiovascular;  Laterality: N/A;    Current  Medications: No outpatient medications have been marked as taking for the 06/27/20 encounter (Appointment) with Abelino Derrick, PA-C.   Current Facility-Administered Medications for the 06/27/20 encounter (Appointment) with Abelino Derrick, PA-C  Medication  . acetaminophen (TYLENOL) tablet 650 mg  . diphenhydrAMINE (BENADRYL) injection 25 mg  . methylPREDNISolone sodium succinate (SOLU-MEDROL) 100 mg in sodium chloride 0.9 % 50 mL IVPB     Allergies:   Patient has no known allergies.   Social History   Socioeconomic History  . Marital status: Divorced    Spouse name: Not on file  . Number of children: 4  . Years of education: Not on file  . Highest education level: High school graduate  Occupational History  . Not on file  Tobacco Use  . Smoking status: Former Smoker    Packs/day: 1.00    Years: 34.00    Pack years: 34.00    Types: Cigarettes    Quit date: 06/28/2016    Years since quitting: 4.0  . Smokeless tobacco: Never Used  Vaping Use  . Vaping Use: Never used  Substance and Sexual Activity  . Alcohol use: Not Currently    Comment: stopped in 2018  . Drug use: Yes    Comment: 02/10/2017 "experimented w/drugs; haven't done any for years"  . Sexual activity: Not Currently  Other Topics Concern  . Not on file  Social History Narrative   Lives  in one story home with daughter; Left handed; some college; caffeine - occasional coffee not daily; walks 2xweek   Social Determinants of Health   Financial Resource Strain:   . Difficulty of Paying Living Expenses: Not on file  Food Insecurity:   . Worried About Programme researcher, broadcasting/film/video in the Last Year: Not on file  . Ran Out of Food in the Last Year: Not on file  Transportation Needs:   . Lack of Transportation (Medical): Not on file  . Lack of Transportation (Non-Medical): Not on file  Physical Activity:   . Days of Exercise per Week: Not on file  . Minutes of Exercise per Session: Not on file  Stress:   . Feeling of Stress :  Not on file  Social Connections:   . Frequency of Communication with Friends and Family: Not on file  . Frequency of Social Gatherings with Friends and Family: Not on file  . Attends Religious Services: Not on file  . Active Member of Clubs or Organizations: Not on file  . Attends Banker Meetings: Not on file  . Marital Status: Not on file     Family History: The patient's family history includes Cardiomyopathy in his mother; Hypertension in his mother; Multiple sclerosis in his sister; Stroke in his maternal aunt.  ROS:   Please see the history of present illness.     All other systems reviewed and are negative.  EKGs/Labs/Other Studies Reviewed:    The following studies were reviewed today: Echo 9/23/20218- Study Conclusions   - Left ventricle: The cavity size was mildly dilated. Wall  thickness was increased in a pattern of mild LVH. The estimated  ejection fraction was 30%. Diffuse hypokinesis. Doppler  parameters are consistent with abnormal left ventricular  relaxation (grade 1 diastolic dysfunction).  - Aortic valve: There was no stenosis.  - Aorta: Mildly dilated aortic root. Aortic root dimension: 39 mm  (ED).  - Mitral valve: There was no significant regurgitation.  - Right ventricle: The cavity size was normal. Systolic function  was normal.  - Pulmonary arteries: No complete TR doppler jet so unable to  estimate PA systolic pressure.  - Inferior vena cava: The vessel was normal in size. The  respirophasic diameter changes were in the normal range (>= 50%),  consistent with normal central venous pressure.   EKG:  EKG is not ordered today.  The ekg ordered 12/17/2019 demonstrates NSR, RBBB, LAFB  Recent Labs: 07/06/2019: NT-Pro BNP 123 10/11/2019: Hemoglobin 13.1 11/09/2019: ALT 20 12/18/2019: BNP 7.2 03/27/2020: BUN 18; Creatinine, Ser 1.04; Potassium 4.7; Sodium 136  Recent Lipid Panel    Component Value Date/Time   CHOL 121  07/06/2019 1432   TRIG 90 07/06/2019 1432   HDL 30 (L) 07/06/2019 1432   CHOLHDL 4.0 07/06/2019 1432   CHOLHDL 4.9 07/18/2017 0558   VLDL 11 07/18/2017 0558   LDLCALC 73 07/06/2019 1432    Physical Exam:    VS:  There were no vitals taken for this visit.    Wt Readings from Last 3 Encounters:  03/18/20 (!) 310 lb 12.8 oz (141 kg)  12/18/19 (!) 329 lb (149.2 kg)  11/22/19 (!) 323 lb 12.8 oz (146.9 kg)     GEN: Obese AA male, no acute distress HEENT: Normal NECK: No JVD; No carotid bruits LYMPHATICS: No lymphadenopathy CARDIAC: RRR, no murmurs, rubs, gallops RESPIRATORY:  Clear to auscultation without rales, wheezing or rhonchi  ABDOMEN: Soft, non-tender, non-distended MUSCULOSKELETAL:  No edema; No  deformity  SKIN: Warm and dry NEUROLOGIC:  Alert and oriented x 3 PSYCHIATRIC:  Normal affect   ASSESSMENT:    Chronic combined systolic and diastolic CHF (congestive heart failure) (HCC) He appears compensated.Treatment has been hampered by non compliance secondary to financial issues.   HTN_ Controlled- 138/76  Non-ischemic cardiomyopathy (HCC) EF 30%by echo Sept 2018  Cerebral embolism with cerebral infarction Sept 2018  Morbid obesity (HCC) BMI45- suspected sleep apnea- snoring, poor sleep, daytime fatigue, HTN, diastolic CHF, morbid obesity. Plan sleep study once he has insurance.   Multiple sclerosis- PCP follows  PLAN:    I would have ordered a f/u echo but he does not have insurance yet.  I did order a BMP.  F/U Dr Tresa Endo in 6 months.    Medication Adjustments/Labs and Tests Ordered: Current medicines are reviewed at length with the patient today.  Concerns regarding medicines are outlined above.  No orders of the defined types were placed in this encounter.  No orders of the defined types were placed in this encounter.   There are no Patient Instructions on file for this visit.   Nicholas Provost, PA-C  06/27/2020 4:26 PM    [place]  [facility]

## 2020-06-27 NOTE — Patient Instructions (Signed)
Medication Instructions:  Your physician recommends that you continue on your current medications as directed. Please refer to the Current Medication list given to you today.  *If you need a refill on your cardiac medications before your next appointment, please call your pharmacy*  Lab Work: Your physician recommends that you return for lab work TODAY:   BMET If you have labs (blood work) drawn today and your tests are completely normal, you will receive your results only by: Marland Kitchen MyChart Message (if you have MyChart) OR . A paper copy in the mail If you have any lab test that is abnormal or we need to change your treatment, we will call you to review the results.  Testing/Procedures: NONE ordered at this time of appointment   Follow-Up: At [facility], you and your health needs are our priority.  As part of our continuing mission to provide you with exceptional heart care, we have created designated Provider Care Teams.  These Care Teams include your primary Cardiologist (physician) and Advanced Practice Providers (APPs -  Physician Assistants and Nurse Practitioners) who all work together to provide you with the care you need, when you need it.  We recommend signing up for the patient portal called "MyChart".  Sign up information is provided on this After Visit Summary.  MyChart is used to connect with patients for Virtual Visits (Telemedicine).  Patients are able to view lab/test results, encounter notes, upcoming appointments, etc.  Non-urgent messages can be sent to your provider as well.   To learn more about what you can do with MyChart, go to [email or web address].    Your next appointment:   6 month(s)  The format for your next appointment:   In Person  Provider:   Nicki Guadalajara, MD  Other Instructions

## 2020-06-28 LAB — BASIC METABOLIC PANEL
BUN/Creatinine Ratio: 14 (ref 9–20)
BUN: 17 mg/dL (ref 6–24)
CO2: 27 mmol/L (ref 20–29)
Calcium: 9.9 mg/dL (ref 8.7–10.2)
Chloride: 104 mmol/L (ref 96–106)
Creatinine, Ser: 1.23 mg/dL (ref 0.76–1.27)
GFR calc Af Amer: 76 mL/min/{1.73_m2} (ref >59)
GFR calc non Af Amer: 66 mL/min/{1.73_m2} (ref >59)
Glucose: 108 mg/dL — ABNORMAL HIGH (ref 65–99)
Potassium: 4.7 mmol/L (ref 3.5–5.2)
Sodium: 143 mmol/L (ref 134–144)

## 2020-07-08 ENCOUNTER — Ambulatory Visit: Payer: Self-pay | Attending: Family Medicine | Admitting: Pharmacist

## 2020-07-08 ENCOUNTER — Other Ambulatory Visit: Payer: Self-pay

## 2020-07-15 ENCOUNTER — Ambulatory Visit: Payer: Self-pay | Attending: Family Medicine | Admitting: Pharmacist

## 2020-07-15 ENCOUNTER — Encounter: Payer: Self-pay | Admitting: Pharmacist

## 2020-07-15 DIAGNOSIS — E1165 Type 2 diabetes mellitus with hyperglycemia: Secondary | ICD-10-CM

## 2020-07-15 NOTE — Progress Notes (Signed)
Virtual Visit via Telephone Note Due to national recommendations of social distancing due to COVID 19, telehealth visit is felt to be most appropriate for this patient at this time. I discussed the limitations, risks, security and privacy concerns of performing an evaluation and management service by telephone and the availability of in person appointments. I also discussed with the patient that there may be a patient responsible charge related to this service. The patient expressed understanding and agreed to proceed.   I connected withCharles Eiland on 07/15/2020 @ 11:00 AM EDT by telephoneand verified that I am speaking with the correct person using two identifiers.  Consent I discussed the limitations, risks, security and privacy concerns of performing an evaluation and management service by telephone and the availability of in person appointments. I also discussed with the patient that there may be a patient responsible charge related to this service. The patient expressed understanding and agreed to proceed.  Location of Patient: [facility]idence  [facility] Provider: [facility]   Persons participating in Telemedicine visit: Butch Penny, PharmD, CPP Patient   S:    PCP: Dr. Earlene Plater  No chief complaint on file.  Patient in good spirits. Presents for diabetes evaluation, education, and management.  Patient was referred and last seen by Primary Care Provider on 05/17/2020.  I saw him on 05/24/2020 and started [place]. We also titrated his dose of metformin. Patient reports doing well since that visit. He denies any side effects and endorses strict medication adherence.   Family/Social History:  - Tobacco: former smoker (quit in 2017) -Alcohol: denies use   Insurance coverage/medication affordability: self pay  Medication adherence reported.   Current diabetes medications include: Farxiga 10 mg daily, metformin 1000 mg  BID Current hypertension medications include: carvedilol 12.5 mg BID, clonidine 0.1 mg BID, furosemide 80 mg daily, losartan 50 mg daily  Current hyperlipidemia medications include: atorvastatin 80 mg daily   Patient denies hypoglycemic events.  Patient reported dietary habits:  - reports eliminating soda, sugary drinks and desserts; drinks mostly water  - struggles with carb intake  Patient-reported exercise habits:  - walks in the morning; 15-20 minutes  - 3x/week    Patient denies polyuria.  Patient reports  Improvement neuropathy (nerve pain). Patient denies visual changes. Patient reports self foot exams.     O:   Lab Results  Component Value Date   HGBA1C 13.3 (H) 05/01/2020   There were no vitals filed for this visit.  Lipid Panel     Component Value Date/Time   CHOL 121 07/06/2019 1432   TRIG 90 07/06/2019 1432   HDL 30 (L) 07/06/2019 1432   CHOLHDL 4.0 07/06/2019 1432   CHOLHDL 4.9 07/18/2017 0558   VLDL 11 07/18/2017 0558   LDLCALC 73 07/06/2019 1432    Home fasting blood sugars: 80s - 120s  2 hour post-meal/random blood sugars: 80s - 90s  Denies any readings >200 since seeing me.   Clinical Atherosclerotic Cardiovascular Disease (ASCVD): Yes  The ASCVD Risk score Denman George DC Jr., et al., 2013) failed to calculate for the following reasons:   The patient has a prior MI or stroke diagnosis    A/P: Diabetes newly dx currently uncontrolled based on A1c, however, home sugars reported are at goal. Patient is able to verbalize appropriate hypoglycemia management plan. Medication adherence reported. Control is suboptimal due to dietary indiscretion and dietary indiscretion.   -Continued current regimen.  -Extensively discussed pathophysiology of diabetes, recommended lifestyle interventions, dietary  effects on blood sugar control -Counseled on s/sx of and management of hypoglycemia -Next A1C anticipated 07/2020.   ASCVD risk - secondary prevention in patient  with diabetes. Last LDL is not controlled. Recommend goal LDL < 70. High intensity statin indicated. May benefit from addition of Zetia in the future. Aspirin is indicated.  -Continued aspirin 81 mg  -Continued atorvastatin 80 mg.   Written patient instructions provided. Total time in face to face counseling 30 minutes.   Follow up PCP Clinic Visit in 1 month.   Butch Penny, PharmD, CPP Clinical Pharmacist [facility] [phone]

## 2020-07-26 ENCOUNTER — Other Ambulatory Visit: Payer: Self-pay | Admitting: Cardiology

## 2020-08-22 ENCOUNTER — Ambulatory Visit (INDEPENDENT_AMBULATORY_CARE_PROVIDER_SITE_OTHER): Payer: Self-pay | Admitting: Physician Assistant

## 2020-08-22 ENCOUNTER — Other Ambulatory Visit: Payer: Self-pay | Admitting: Internal Medicine

## 2020-08-22 ENCOUNTER — Other Ambulatory Visit: Payer: Self-pay

## 2020-08-22 VITALS — BP 146/93 | HR 85 | Temp 97.5°F | Resp 17 | Wt 299.0 lb

## 2020-08-22 DIAGNOSIS — I5042 Chronic combined systolic (congestive) and diastolic (congestive) heart failure: Secondary | ICD-10-CM

## 2020-08-22 DIAGNOSIS — Z23 Encounter for immunization: Secondary | ICD-10-CM

## 2020-08-22 DIAGNOSIS — Z1211 Encounter for screening for malignant neoplasm of colon: Secondary | ICD-10-CM

## 2020-08-22 DIAGNOSIS — I10 Essential (primary) hypertension: Secondary | ICD-10-CM

## 2020-08-22 DIAGNOSIS — E785 Hyperlipidemia, unspecified: Secondary | ICD-10-CM

## 2020-08-22 DIAGNOSIS — E1165 Type 2 diabetes mellitus with hyperglycemia: Secondary | ICD-10-CM

## 2020-08-22 LAB — POCT GLYCOSYLATED HEMOGLOBIN (HGB A1C): Hemoglobin A1C: 5.3 % (ref 4.0–5.6)

## 2020-08-22 LAB — GLUCOSE, POCT (MANUAL RESULT ENTRY): POC Glucose: 105 mg/dl — AB (ref 70–99)

## 2020-08-22 MED ORDER — CARVEDILOL 12.5 MG PO TABS: 12.5000 mg | ORAL_TABLET | Freq: Two times a day (BID) | ORAL | 10 refills | Status: DC

## 2020-08-22 MED ORDER — CLONIDINE HCL 0.1 MG PO TABS: 0.1000 mg | ORAL_TABLET | Freq: Two times a day (BID) | ORAL | 10 refills | Status: DC

## 2020-08-22 MED ORDER — ATORVASTATIN CALCIUM 80 MG PO TABS: 80.0000 mg | ORAL_TABLET | Freq: Every day | ORAL | 10 refills | Status: DC

## 2020-08-22 MED ORDER — DAPAGLIFLOZIN PROPANEDIOL 10 MG PO TABS: 10.0000 mg | ORAL_TABLET | Freq: Every day | ORAL | 2 refills | Status: DC

## 2020-08-22 MED ORDER — LOSARTAN POTASSIUM 50 MG PO TABS: 50.0000 mg | ORAL_TABLET | Freq: Every day | ORAL | 6 refills | Status: DC

## 2020-08-22 MED ORDER — POTASSIUM CHLORIDE CRYS ER 20 MEQ PO TBCR: 20.0000 meq | EXTENDED_RELEASE_TABLET | Freq: Every day | ORAL | 10 refills | Status: DC

## 2020-08-22 MED ORDER — FUROSEMIDE 80 MG PO TABS: 80.0000 mg | ORAL_TABLET | Freq: Every day | ORAL | 5 refills | Status: DC

## 2020-08-22 MED ORDER — METFORMIN HCL 500 MG PO TABS: 1000.0000 mg | ORAL_TABLET | Freq: Two times a day (BID) | ORAL | 2 refills | Status: DC

## 2020-08-22 NOTE — Progress Notes (Signed)
Nicholas Kelly, is a 54 y.o. male  WPY:[id number]  KNL:[id number]  DOB - [birth year]  Subjective:  Chief Complaint and HPI: Nicholas Kelly is a 54 y.o. male here today for med RF.  Says blood sugars have been well controlled and demonstrated here with much improved A1C=5.3(vs >13).  He does not always adhere to diabetic diet.  He does want to get his flu shot today.  He checks his BP OOO and says numbers are ~120-125/80-85.  He says it is always a little higher at the doctor's office.    He denies any CP/SOB.  Saw cardiology in September and Neurology for MS in August.    ROS:   Constitutional:  No f/c, No night sweats, No unexplained weight loss. EENT:  No vision changes, No blurry vision, No hearing changes. No mouth, throat, or ear problems.  Respiratory: No cough, No new SOB Cardiac: No CP, no palpitations GI:  No abd pain, No N/V/D. GU: No Urinary s/sx Musculoskeletal: No joint pain Neuro: No headache, no dizziness, no motor weakness.  Skin: No rash Endocrine:  No polydipsia. No polyuria.  Psych: Denies SI/HI  No problems updated.  ALLERGIES: No Known Allergies  PAST MEDICAL HISTORY: Past Medical History:  Diagnosis Date  . Acute CHF (congestive heart failure) ([place]) 02/10/2017  . Dyspnea   . Hypertension   . Stroke [facility])     MEDICATIONS AT HOME: Prior to Admission medications   Medication Sig Start Date End Date Taking? Authorizing Provider  aspirin 81 MG tablet Take 1 tablet (81 mg total) by mouth daily. 01/31/18  Yes Troy Sine, MD  atorvastatin (LIPITOR) 80 MG tablet Take 1 tablet (80 mg total) by mouth daily at 6 PM. 08/22/20  Yes McClung, Dionne Bucy, PA-C  Blood Glucose Monitoring Suppl (TRUE METRIX METER) w/Device KIT Use as instructed to check blood sugar BID. 05/24/20  Yes Nicolette Bang, DO  carvedilol (COREG) 12.5 MG tablet Take 1 tablet (12.5 mg total) by mouth 2 (two) times daily with a meal. 08/22/20  Yes McClung, Angela M, PA-C   cloNIDine (CATAPRES) 0.1 MG tablet Take 1 tablet (0.1 mg total) by mouth 2 (two) times daily. 08/22/20  Yes Freeman Caldron M, PA-C  dapagliflozin propanediol (FARXIGA) 10 MG TABS tablet Take 1 tablet (10 mg total) by mouth daily before breakfast. 08/22/20  Yes McClung, Angela M, PA-C  furosemide (LASIX) 80 MG tablet Take 1 tablet (80 mg total) by mouth daily. 08/22/20  Yes Freeman Caldron M, PA-C  glucose blood test strip Use as instructed to check blood sugar twice daily. 05/24/20  Yes Nicolette Bang, DO  losartan (COZAAR) 50 MG tablet Take 1 tablet (50 mg total) by mouth daily. 08/22/20  Yes Freeman Caldron M, PA-C  metFORMIN (GLUCOPHAGE) 500 MG tablet Take 2 tablets (1,000 mg total) by mouth 2 (two) times daily with a meal. 08/22/20  Yes McClung, Angela M, PA-C  potassium chloride SA (KLOR-CON) 20 MEQ tablet Take 1 tablet (20 mEq total) by mouth daily. 08/22/20  Yes Freeman Caldron M, PA-C  TRUEplus Lancets 28G MISC Use to check blood sugar twice daily. 05/24/20  Yes Nicolette Bang, DO     Objective:  EXAM:   Vitals:   08/22/20 1512  BP: (!) 146/93  Pulse: 85  Resp: 17  Temp: (!) 97.5 F (36.4 C)  TempSrc: Temporal  SpO2: 96%  Weight: 299 lb (135.6 kg)    General appearance : A&OX3. NAD. Non-toxic-appearing;  Morbidly obese HEENT:  Atraumatic and Normocephalic.  PERRLA. EOM intact.   Chest/Lungs:  Breathing-non-labored, Good air entry bilaterally, breath sounds normal without rales, rhonchi, or wheezing  CVS: S1 S2 regular, no murmurs, gallops, rubs  Extremities: Bilateral Lower Ext shows minimal edema, both legs are warm to touch with = pulse throughout Neurology:  CN II-XII grossly intact, Non focal.   Psych:  TP linear. J/I WNL. Normal speech. Appropriate eye contact and affect.  Skin:  No Rash  Data Review Lab Results  Component Value Date   HGBA1C 5.3 08/22/2020   HGBA1C 13.3 (H) 05/01/2020   HGBA1C 13.8 (H) 03/27/2020     Assessment & Plan    1. Uncontrolled type 2 diabetes mellitus with hyperglycemia  Controlled!~!!  Continue current regimen. Check blood sugars and adhere to diabetic diet.  Weight loss recommended - HgB A1c - HM Diabetes Foot Exam - Glucose (CBG) - dapagliflozin propanediol (FARXIGA) 10 MG TABS tablet; Take 1 tablet (10 mg total) by mouth daily before breakfast.  Dispense: 30 tablet; Refill: 2 - metFORMIN (GLUCOPHAGE) 500 MG tablet; Take 2 tablets (1,000 mg total) by mouth 2 (two) times daily with a meal.  Dispense: 120 tablet; Refill: 2  2. Essential hypertension Not at goal in office but at goal based on out of office reading-continue current regimen.  BMP  WNL 1 months ago.   - CBC with Differential - carvedilol (COREG) 12.5 MG tablet; Take 1 tablet (12.5 mg total) by mouth 2 (two) times daily with a meal.  Dispense: 60 tablet; Refill: 10 - losartan (COZAAR) 50 MG tablet; Take 1 tablet (50 mg total) by mouth daily.  Dispense: 30 tablet; Refill: 6 - cloNIDine (CATAPRES) 0.1 MG tablet; Take 1 tablet (0.1 mg total) by mouth 2 (two) times daily.  Dispense: 60 tablet; Refill: 10 - potassium chloride SA (KLOR-CON) 20 MEQ tablet; Take 1 tablet (20 mEq total) by mouth daily.  Dispense: 30 tablet; Refill: 10  3. Colon cancer screening - Fecal occult blood, imunochemical([facility]); Future  4. Hyperlipidemia LDL goal <70 - atorvastatin (LIPITOR) 80 MG tablet; Take 1 tablet (80 mg total) by mouth daily at 6 PM.  Dispense: 30 tablet; Refill: 10  5. Chronic combined systolic and diastolic CHF (congestive heart failure) (HCC) - furosemide (LASIX) 80 MG tablet; Take 1 tablet (80 mg total) by mouth daily.  Dispense: 30 tablet; Refill: 5  Patient have been counseled extensively about nutrition and exercise  Return in about 3 months (around 11/22/2020) for PCP;  DM and htn and check lipids/labs.  The patient was given clear instructions to go to ER or return to medical center if symptoms don't improve, worsen or  new problems develop. The patient verbalized understanding. The patient was told to call to get lab results if they haven't heard anything in the next week.     Freeman Caldron, PA-C [facility] [place], [place]   08/22/2020, 3:31 PMPatient ID: [name], male   DOB: 20-Feb-1966, 54 y.o.   MRN: [id number]

## 2020-08-22 NOTE — Addendum Note (Signed)
Addended by: Heidi Dach on: 08/22/2020 04:12 PM   Modules accepted: Orders

## 2020-08-23 LAB — CBC WITH DIFFERENTIAL/PLATELET
Basophils Absolute: 0.1 10*3/uL (ref 0.0–0.2)
Basos: 1 %
EOS (ABSOLUTE): 0.3 10*3/uL (ref 0.0–0.4)
Eos: 4 %
Hematocrit: 41.2 % (ref 37.5–51.0)
Hemoglobin: 12.6 g/dL — ABNORMAL LOW (ref 13.0–17.7)
Immature Grans (Abs): 0 10*3/uL (ref 0.0–0.1)
Immature Granulocytes: 0 %
Lymphocytes Absolute: 2.3 10*3/uL (ref 0.7–3.1)
Lymphs: 24 %
MCH: 29.6 pg (ref 26.6–33.0)
MCHC: 30.6 g/dL — ABNORMAL LOW (ref 31.5–35.7)
MCV: 97 fL (ref 79–97)
Monocytes Absolute: 1.1 10*3/uL — ABNORMAL HIGH (ref 0.1–0.9)
Monocytes: 11 %
Neutrophils Absolute: 6 10*3/uL (ref 1.4–7.0)
Neutrophils: 60 %
Platelets: 343 10*3/uL (ref 150–450)
RBC: 4.26 x10E6/uL (ref 4.14–5.80)
RDW: 12.8 % (ref 11.6–15.4)
WBC: 9.8 10*3/uL (ref 3.4–10.8)

## 2020-10-01 ENCOUNTER — Other Ambulatory Visit: Payer: Self-pay | Admitting: Physician Assistant

## 2020-10-01 ENCOUNTER — Telehealth: Payer: Self-pay

## 2020-10-01 NOTE — Telephone Encounter (Signed)
Fax received from [facility] for [employer]. Need to contact pt to get new income and insurance info.    Telephone call to pt to advise. Also pt due to schedule a visit to see Dr.Jaffe.  Pt will call [employer] to give his information. Pt will call back to schedule a visit to see Dr.Jaffe.

## 2020-10-04 ENCOUNTER — Telehealth: Payer: Self-pay

## 2020-10-04 NOTE — Telephone Encounter (Signed)
Attempted prior authorization on the Farxiga initiated by PA [place] at patient's last visit. It was denied because patient hasn't tried & failed Invokana or Jardiance.  Can you send an alternative medication to [facility]?

## 2020-10-09 ENCOUNTER — Other Ambulatory Visit: Payer: Self-pay | Admitting: Internal Medicine

## 2020-10-09 MED ORDER — EMPAGLIFLOZIN 10 MG PO TABS: 10.0000 mg | ORAL_TABLET | Freq: Every day | ORAL | 2 refills | Status: DC

## 2020-10-09 NOTE — Telephone Encounter (Signed)
Sent Jardiance to pharmacy in place of [place]. Thanks!   Marcy Siren, D.O. Primary Care at [facility]  10/09/2020, 9:16 AM

## 2020-11-06 ENCOUNTER — Other Ambulatory Visit: Payer: Self-pay | Admitting: Internal Medicine

## 2020-11-21 ENCOUNTER — Other Ambulatory Visit: Payer: Self-pay

## 2020-11-21 ENCOUNTER — Encounter: Payer: Self-pay | Admitting: Internal Medicine

## 2020-11-21 ENCOUNTER — Ambulatory Visit: Payer: 59 | Admitting: Internal Medicine

## 2020-11-21 VITALS — BP 123/79 | HR 70 | Temp 97.8°F | Resp 18 | Ht 68.0 in | Wt 309.4 lb

## 2020-11-21 DIAGNOSIS — E119 Type 2 diabetes mellitus without complications: Secondary | ICD-10-CM | POA: Diagnosis not present

## 2020-11-21 DIAGNOSIS — I10 Essential (primary) hypertension: Secondary | ICD-10-CM | POA: Diagnosis not present

## 2020-11-21 DIAGNOSIS — E785 Hyperlipidemia, unspecified: Secondary | ICD-10-CM

## 2020-11-21 LAB — GLUCOSE, POCT (MANUAL RESULT ENTRY): POC Glucose: 103 mg/dl — AB (ref 70–99)

## 2020-11-21 LAB — POCT GLYCOSYLATED HEMOGLOBIN (HGB A1C): Hemoglobin A1C: 5.5 % (ref 4.0–5.6)

## 2020-11-21 MED ORDER — EMPAGLIFLOZIN 10 MG PO TABS: 10.0000 mg | ORAL_TABLET | Freq: Every day | ORAL | 2 refills | Status: DC

## 2020-11-21 NOTE — Progress Notes (Signed)
Subjective:    Nicholas Kelly. - 55 y.o. male MRN [id number]  Date of birth: December 28, 1965  HPI  Nicholas Kelly. is here for follow up of chronic medical conditions.  Diabetes mellitus, Type 2 Disease Monitoring             Blood Sugar Ranges: Fasting - 80-110s             Polyuria: no              Visual problems: no   Urine Microalbumin: collected today---on Arb   Last A1C: 5.3 (Oct 2021) previously 13.3 in July 2021  Medications: Jardiance 10 mg, Metformin 1000 mg BID,  Medication Compliance: yes  Medication Side Effects             Hypoglycemia: no    Chronic HTN Disease Monitoring:  Home BP Monitoring - 130/80s on avg  Chest pain- no  Dyspnea- no Headache - no  Medications: Coreg 12.5 mg BID, Clonidine 0.1 mg BID, Losartan 50 mg  Compliance- yesLightheadedness- no  Edema- no     Health Maintenance:  Health Maintenance Due  Topic Date Due  . COLON CANCER SCREENING ANNUAL FOBT  07/16/2020    -  reports that he quit smoking about 4 years ago. His smoking use included cigarettes. He has a 34.00 pack-year smoking history. He has never used smokeless tobacco. - Review of Systems: Per HPI. - Past Medical History: Patient Active Problem List   Diagnosis Date Noted  . DM (diabetes mellitus), type 2, uncontrolled (HCC) 03/28/2020  . Hyperlipidemia 09/20/2017  . Cerebral embolism with cerebral infarction 07/19/2017  . Essential hypertension 07/17/2017  . Vision disturbance 07/17/2017  . Non-ischemic cardiomyopathy (HCC) 03/09/2017  . Chronic combined systolic and diastolic CHF (congestive heart failure) (HCC) 02/10/2017  . Hypertensive urgency 02/10/2017  . Morbid obesity (HCC) 02/10/2017  . Tobacco abuse 02/10/2017  . Acute exacerbation of CHF (congestive heart failure) (HCC) 02/10/2017   - Medications: reviewed and updated   Objective:   Physical Exam BP 123/79 (BP Location: Right Arm, Patient Position: Sitting, Cuff Size: Large)    Pulse 70   Temp 97.8 F (36.6 C) (Oral)   Resp 18   Ht 5\' 8"  (1.727 m)   Wt (!) 309 lb 6.4 oz (140.3 kg)   SpO2 93%   BMI 47.04 kg/m  Physical Exam Constitutional:      General: He is not in acute distress.    Appearance: He is not diaphoretic.  HENT:     Head: Normocephalic and atraumatic.  Eyes:     Extraocular Movements: EOM normal.     Conjunctiva/sclera: Conjunctivae normal.  Cardiovascular:     Rate and Rhythm: Normal rate and regular rhythm.     Heart sounds: Normal heart sounds. No murmur heard.   Pulmonary:     Effort: Pulmonary effort is normal. No respiratory distress.     Breath sounds: Normal breath sounds.  Musculoskeletal:        General: Normal range of motion.  Skin:    General: Skin is warm and dry.  Neurological:     Mental Status: He is alert and oriented to person, place, and time.  Psychiatric:        Mood and Affect: Affect normal.        Judgment: Judgment normal.        Assessment & Plan:   1. Type 2 diabetes mellitus without complication, without long-term current use of insulin (HCC)  A1c 5.5%, still showing excellent glucose control. Continue current medication regimen.  Counseled on Diabetic diet, my plate method, 884 minutes of moderate intensity exercise/week Blood sugar logs with fasting goals of 80-120 mg/dl, random of less than 166 and in the event of sugars less than 60 mg/dl or greater than 063 mg/dl encouraged to notify the clinic. Advised on the need for annual eye exams, annual foot exams, Pneumonia vaccine. - Microalbumin / creatinine urine ratio; Future - Glucose (CBG) - HgB A1c - empagliflozin (JARDIANCE) 10 MG TABS tablet; Take 1 tablet (10 mg total) by mouth daily before breakfast.  Dispense: 30 tablet; Refill: 2  2. Essential hypertension BP is at goal today. Asymptomatic. Continue current regimen.   3. Hyperlipidemia, unspecified hyperlipidemia type Last LDL well controlled with result of 73 in Sept 2020. Monitor.  Continue Lipitor 80 mg.  - Lipid panel   Marcy Siren, D.O. 11/21/2020, 11:07 AM Primary Care at [facility]

## 2020-11-22 LAB — LIPID PANEL
Chol/HDL Ratio: 4 ratio (ref 0.0–5.0)
Cholesterol, Total: 158 mg/dL (ref 100–199)
HDL: 40 mg/dL (ref >39)
LDL Chol Calc (NIH): 104 mg/dL — ABNORMAL HIGH (ref 0–99)
Triglycerides: 74 mg/dL (ref 0–149)
VLDL Cholesterol Cal: 14 mg/dL (ref 5–40)

## 2020-11-22 LAB — MICROALBUMIN / CREATININE URINE RATIO
Creatinine, Urine: 13.2 mg/dL
Microalb/Creat Ratio: <23 mg/g creat (ref 0–29)
Microalbumin, Urine: <3 ug/mL

## 2020-11-26 ENCOUNTER — Ambulatory Visit: Payer: 59 | Attending: Internal Medicine

## 2020-11-26 DIAGNOSIS — Z23 Encounter for immunization: Secondary | ICD-10-CM

## 2020-11-26 NOTE — Progress Notes (Signed)
   [id number]  Name:  Nicholas Kelly.    MRN: [id number] DOB: Jan 23, 1966  11/26/2020  [name] was observed post Covid-19 immunization for 15 minutes without incident. He was provided with Vaccine Information Sheet and instruction to access the V-Safe system.   [name] was instructed to call 911 with any severe reactions post vaccine: Marland Kitchen Difficulty breathing  . Swelling of face and throat  . A fast heartbeat  . A bad rash all over body  . Dizziness and weakness   Immunizations Administered    Name Date Dose VIS Date Route   Moderna COVID-19 Vaccine 11/26/2020  1:25 PM 0.5 mL 08/14/2020 Intramuscular   Manufacturer: Gala Murdoch   Lot: [id number]   NDC: 68127-517-00

## 2020-12-12 ENCOUNTER — Telehealth: Payer: Self-pay | Admitting: Internal Medicine

## 2020-12-12 NOTE — Telephone Encounter (Signed)
Pt is out of empagliflozin (JARDIANCE) 10 MG TABS tablet and    metFORMIN (GLUCOPHAGE) 500 MG tablet  Pt has changed pharmacy   to [address] [place], [place] [address].     Please advise and thank you

## 2020-12-13 ENCOUNTER — Encounter (HOSPITAL_COMMUNITY): Payer: Self-pay

## 2020-12-13 ENCOUNTER — Other Ambulatory Visit: Payer: Self-pay

## 2020-12-13 ENCOUNTER — Encounter: Payer: Self-pay | Admitting: Neurology

## 2020-12-13 ENCOUNTER — Other Ambulatory Visit (INDEPENDENT_AMBULATORY_CARE_PROVIDER_SITE_OTHER): Payer: 59

## 2020-12-13 ENCOUNTER — Ambulatory Visit (INDEPENDENT_AMBULATORY_CARE_PROVIDER_SITE_OTHER): Payer: 59 | Admitting: Neurology

## 2020-12-13 VITALS — BP 198/135 | HR 81 | Ht 68.0 in | Wt 317.0 lb

## 2020-12-13 DIAGNOSIS — G35 Multiple sclerosis: Secondary | ICD-10-CM

## 2020-12-13 DIAGNOSIS — M4802 Spinal stenosis, cervical region: Secondary | ICD-10-CM

## 2020-12-13 DIAGNOSIS — I10 Essential (primary) hypertension: Secondary | ICD-10-CM | POA: Diagnosis not present

## 2020-12-13 DIAGNOSIS — E119 Type 2 diabetes mellitus without complications: Secondary | ICD-10-CM

## 2020-12-13 DIAGNOSIS — E1165 Type 2 diabetes mellitus with hyperglycemia: Secondary | ICD-10-CM

## 2020-12-13 LAB — VITAMIN D 25 HYDROXY (VIT D DEFICIENCY, FRACTURES): VITD: 10.23 ng/mL — ABNORMAL LOW (ref 30.00–100.00)

## 2020-12-13 MED ORDER — METFORMIN HCL 500 MG PO TABS: 1000.0000 mg | ORAL_TABLET | Freq: Two times a day (BID) | ORAL | 2 refills | Status: DC

## 2020-12-13 MED ORDER — EMPAGLIFLOZIN 10 MG PO TABS: 10.0000 mg | ORAL_TABLET | Freq: Every day | ORAL | 2 refills | Status: DC

## 2020-12-13 NOTE — Telephone Encounter (Signed)
RF sent.

## 2020-12-13 NOTE — Progress Notes (Signed)
NEUROLOGY FOLLOW UP OFFICE NOTE  Nicholas Kelly [id number]   Subjective:  Nicholas Kelly is a 67 year oldleft-handed black man with non-ischemic cardiomyopathy, hypertension and history of stroke who follows up for multiple clerosis.  UPDATE: Current DMT:  Ocrevus Other medications:  D3 (unsure of dose)  10/11/2019 LABS:  JCV antibody positive with index of 1.66.  Hep B antibody non-reactive.  Vit D level 12.  He was advised to start D3 4000 IU daily.  Last seen in December 2020.  He was started on Ocrevus.  Next infusion scheduled for the 24th.  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  Vision:  No issues Motor:  Left greater than right legs feel heavy as the day progresses.  Upper extremities feel fine.   Sensory:  No issues Pain:  No issues Gait:  No issues Bowel/Bladder:  Decreased bowel movements. Fatigue:  No issues Cognition:  No issues Mood:  No issues  HISTORY: In 2007, he was working at [employer] and he tripped over something and fell forward landing on his arms. After he fell, his arms were contracted. He was told it was due to trauma and symptoms resolved after a month.  Hewas admitted to [facility] on 07/17/17 for diplopia and disequilibrium. Exam revealed disconjugate gaze, with bilateral horizontal gaze palsy with inward deviation. CT of head showed old right parietal infarct. MRI of brain showed encephalomalacia in the bilateral pons and right parietal lobe but no acute findings. Findings in the pons was determined to represent the cause of symptoms, now chronic due to late presentation. 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   In August, he woke up with left arm weakness and arm and hand were slight contracted. He also noticed that his speech was not normal. He noted some mild numbness in the left arm as well. No associated  headache, visual disturbance. He presented to the ED about a week later on 06/15/19 for further evaluation. CT of brain showed atrophy and chronic small vessel ischemic changes but no acute intracranial abnormality. MRI of brain without contrast, which was limited due to motion artifact, showed pathy confluent T2/FLAIR signal abnormality involving the periventricular cerebral white matter and pons. Concern that this may represent demyelinating disease. No associated restricted diffusion to suggest active demyelination. MRI of cervical spine without contrast, also limited due to motion artifact, showed patchy cord signal abnormality involving the upper cervical spinal cord at level of C2-3 and C3-4 with no obvious cord expansion to suggest active demyelinating disease. Also demonstrated, multilevel cervical spondylosis with diffuse mild spinal stenosis at C2-C3 through C5-C6, mild right C4 foraminal narrowing and mild to moderate left C5 foraminal narrowing and mild bilateral C7 foraminal narrowing. Neurologic exam in the ED revealed questionable mild dysarthria but otherwise no focal or lateralizing findings. MRI was reviewed by the neurohospitalist who questioned that findings represented a new demyelinating process. He was discharged with outpatient neurology follow up. MRI of brain and cervical spine from 09/19/2019 showed cerebral white matter disease suggestive of MS and probable underlying chronic small vessel ischemic changes.  Cervical spine showed hyperintensity and cord mass effect at the C2-3 and C3-4 levels with associated degenerative spinal stenosis rather than MS lesions.  He was referred to neurosurgery but he never received a call to schedule an appointment.  He underwent LP on 09/29/2019 where CSF analysis demonstrated over 5 oligoclonal bands and  IgG index 1.82.  Labs: 09/29/2019 Lumbar Puncture:  CSF cell count 28/96% lymphs, culture with moderate WBCs but no organisms, glucose 75,  protein 55, myelin basic protein <2, >5 oligoclonal bands, IgG index 1.82, cytology no malignant cells   Imaging: 09/19/2019 MRI BRAIN & CERVICAL SPINE W WO:  Extensive T2/FLAIR hyperintensity in the cerebral white matter and dorsal brainstem/ventral surface of 4th ventricle consistent with demyelinating disease as well as probable superimposed chronic small vessel ischemic changes, including right parietal lobe and possibly pons; patchy abnormal T2/STIR hyperintensity at C2-3 and C3-C4 levels with associated degenerative spinal stenosis and spinal cord mass effect suggesting 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  PAST MEDICAL HISTORY: Past Medical History:  Diagnosis Date  . Acute CHF (congestive heart failure) ([place]) 02/10/2017  . Dyspnea   . Hypertension   . Stroke [facility])     MEDICATIONS: Current Outpatient Medications on File Prior to Visit  Medication Sig Dispense Refill  . aspirin 81 MG tablet Take 1 tablet (81 mg total) by mouth daily. 30 tablet 3  . atorvastatin (LIPITOR) 80 MG tablet Take 1 tablet (80 mg total) by mouth daily at 6 PM. 30 tablet 10  . Blood Glucose Monitoring Suppl (TRUE METRIX METER) w/Device KIT Use as instructed to check blood sugar BID. 1 kit 0  . carvedilol (COREG) 12.5 MG tablet Take 1 tablet (12.5 mg total) by mouth 2 (two) times daily with a meal. 60 tablet 10  . cloNIDine (CATAPRES) 0.1 MG tablet Take 1 tablet (0.1 mg total) by mouth 2 (two) times daily. 60 tablet 10  . empagliflozin (JARDIANCE) 10 MG TABS tablet Take 1 tablet (10 mg total) by mouth daily before breakfast. 30 tablet 2  . furosemide (LASIX) 80 MG tablet Take 1 tablet (80 mg total) by mouth daily. 30 tablet 5  . glucose blood test strip Use as instructed to check blood sugar twice daily. 100 each 6  . losartan (COZAAR) 50 MG tablet Take 1 tablet (50 mg  total) by mouth daily. 30 tablet 6  . metFORMIN (GLUCOPHAGE) 500 MG tablet Take 2 tablets (1,000 mg total) by mouth 2 (two) times daily with a meal. 120 tablet 2  . potassium chloride SA (KLOR-CON) 20 MEQ tablet Take 1 tablet (20 mEq total) by mouth daily. 30 tablet 10  . TRUEplus Lancets 28G MISC Use to check blood sugar twice daily. 100 each 6   No current facility-administered medications on file prior to visit.    ALLERGIES: No Known Allergies  FAMILY HISTORY: Family History  Problem Relation Age of Onset  . Hypertension Mother   . Cardiomyopathy Mother   . Stroke Maternal Aunt   . Multiple sclerosis Sister     SOCIAL HISTORY: Social History   Socioeconomic History  . Marital status: Divorced    Spouse name: Not on file  . Number of children: 4  . Years of education: Not on file  . Highest education level: High school graduate  Occupational History  . Not on file  Tobacco Use  . Smoking status: Former Smoker    Packs/day: 1.00    Years: 34.00    Pack years: 34.00    Types: Cigarettes    Quit date: 06/28/2016    Years since quitting: 4.4  . Smokeless tobacco: Never Used  Vaping Use  . Vaping Use: Never used  Substance and Sexual Activity  .  Alcohol use: Not Currently    Comment: stopped in 2018  . Drug use: Yes    Comment: 02/10/2017 "experimented w/drugs; haven't done any for years"  . Sexual activity: Not Currently  Other Topics Concern  . Not on file  Social History Narrative   Lives in one story home with daughter; Left handed; some college; caffeine - occasional coffee not daily; walks 2xweek   Social Determinants of Health   Financial Resource Strain: Not on file  Food Insecurity: Not on file  Transportation Needs: Not on file  Physical Activity: Not on file  Stress: Not on file  Social Connections: Not on file  Intimate Partner Violence: Not on file     Objective:  Blood pressure (!) 198/135, pulse 81, height 5' 8" (1.727 m), weight (!) 317 lb  (143.8 kg), SpO2 96 %. General: No acute distress.  Patient appears well-groomed.   Head:  Normocephalic/atraumatic Eyes:  Fundi examined but not visualized Neck: supple, no paraspinal tenderness, full range of motion Heart:  Regular rate and rhythm Lungs:  Clear to auscultation bilaterally Back: No paraspinal tenderness Neurological Exam: alert and oriented to person, place, and time. Attention span and concentration intact, recent and remote memory intact, fund of knowledge intact.  Speech fluent and not dysarthric, language intact.  CN II-XII intact. Bulk and tone normal, Mildly reduced finger-thumb speed on left; otherwise muscle strength 5/5 throughout.  Sensation to light touch, temperature and vibration intact.  Deep tendon reflexes 2+ throughout, toes downgoing.  Finger to nose and heel to shin testing intact.  Mildly wide-based gait.  Romberg negative.   Assessment/Plan:   1.  Multiple sclerosis 2.  Cervical spinal stenosis with myelopathy - he appears to be asymptomatic but given the evidence of edema and cord mass effect, should still be evaluated by neurosurgery. 3.  Hypertension  1.  DMT:  Ocrevus 2.  D3.  Advised to contact us with the dosing. 3.  Check quantitative immunoglobulin panel and vit D level. 4.  Recheck MRI of brain/cervical/thoracic spine with and without contrast 5.  Following review of MRI cervical spine, will likely send new referral to neurosurgery 6.  Follow up with PCP regarding blood pressure 7.  Follow up with me in 6 months.  Metta Clines, DO  CC:  Phill Myron, DO

## 2020-12-13 NOTE — Patient Instructions (Addendum)
1.  Ocrevus every 6 months 2.  Contact us regarding dose of your vitamin D 3.  Check quantitative immunoglobulin panel and vit d level today and again in 6 months. Your provider has requested that you have labwork completed today. Please go to [facility] ([id number]) on the second floor of this building before leaving the office today. You do not need to check in. If you are not called within 15 minutes please check with the front desk.  4.  Check MRI brain/cervical/thoracic spine with and without contrast. We have sent a referral to [facility] for your MRI and they will call you directly to schedule your appointment. They are located at [address] [facility]. If you need to contact them directly please call [phone].    Will likely refer you again to neurosurgery regarding pressure on your spinal cord. 5.  Follow up in 6 months

## 2020-12-14 LAB — IGG, IGA, IGM
IgG (Immunoglobin G), Serum: 1677 mg/dL — ABNORMAL HIGH (ref 600–1640)
IgM, Serum: 27 mg/dL — ABNORMAL LOW (ref 50–300)
Immunoglobulin A: 440 mg/dL — ABNORMAL HIGH (ref 47–310)

## 2020-12-18 ENCOUNTER — Other Ambulatory Visit: Payer: Self-pay

## 2020-12-18 ENCOUNTER — Non-Acute Institutional Stay (HOSPITAL_COMMUNITY)
Admission: RE | Admit: 2020-12-18 | Discharge: 2020-12-18 | Disposition: A | Discharge status: Home / Self Care | Payer: 59 | Source: Ambulatory Visit | Attending: Internal Medicine | Admitting: Internal Medicine

## 2020-12-18 DIAGNOSIS — G35 Multiple sclerosis: Secondary | ICD-10-CM | POA: Insufficient documentation

## 2020-12-18 MED ORDER — METHYLPREDNISOLONE SODIUM SUCC 125 MG IJ SOLR
100.0000 mg | Freq: Once | INTRAMUSCULAR | Status: AC
  Administered 2020-12-18: 100 mg via INTRAVENOUS
  Filled 2020-12-18: qty 2

## 2020-12-18 MED ORDER — SODIUM CHLORIDE 0.9 % IV SOLN
600.0000 mg | Freq: Once | INTRAVENOUS | Status: AC
  Administered 2020-12-18: 600 mg via INTRAVENOUS
  Filled 2020-12-18: qty 20

## 2020-12-18 MED ORDER — DIPHENHYDRAMINE HCL 50 MG/ML IJ SOLN
25.0000 mg | Freq: Once | INTRAMUSCULAR | Status: AC
  Administered 2020-12-18: 25 mg via INTRAVENOUS
  Filled 2020-12-18: qty 1

## 2020-12-18 MED ORDER — ACETAMINOPHEN 325 MG PO TABS
650.0000 mg | ORAL_TABLET | Freq: Once | ORAL | Status: AC
  Administered 2020-12-18: 650 mg via ORAL
  Filled 2020-12-18: qty 2

## 2020-12-18 MED ORDER — SODIUM CHLORIDE 0.9 % IV SOLN: INTRAVENOUS | Status: DC | PRN

## 2020-12-18 NOTE — Discharge Instructions (Signed)
Ocrelizumab injection What is this medicine? OCRELIZUMAB (ok re LIZ ue mab) treats multiple sclerosis. It helps to decrease the number of multiple sclerosis relapses. It is not a cure. This medicine may be used for other purposes; ask your health care provider or pharmacist if you have questions. COMMON BRAND NAME(S): OCREVUS What should I tell my health care provider before I take this medicine? They need to know if you have any of these conditions:  cancer  hepatitis B infection  other infection (especially a virus infection such as chickenpox, cold sores, or herpes)  an unusual or allergic reaction to ocrelizumab, other medicines, foods, dyes or preservatives  pregnant or trying to get pregnant  breast-feeding How should I use this medicine? This medicine is for infusion into a vein. It is given by a health care professional in a hospital or clinic setting. A special MedGuide will be given to you before each treatment. Be sure to read this information carefully each time. Talk to your pediatrician regarding the use of this medicine in children. Special care may be needed. Overdosage: If you think you have taken too much of this medicine contact a poison control center or emergency room at once. NOTE: This medicine is only for you. Do not share this medicine with others. What if I miss a dose? Keep appointments for follow-up doses as directed. It is important not to miss your dose. Call your doctor or health care professional if you are unable to keep an appointment. What may interact with this medicine?  alemtuzumab  daclizumab  dimethyl fumarate  fingolimod  glatiramer  interferon beta  live virus vaccines  mitoxantrone  natalizumab  peginterferon beta  rituximab  steroid medicines like prednisone or cortisone  teriflunomide This list may not describe all possible interactions. Give your health care provider a list of all the medicines, herbs,  non-prescription drugs, or dietary supplements you use. Also tell them if you smoke, drink alcohol, or use illegal drugs. Some items may interact with your medicine. What should I watch for while using this medicine? Your condition will be monitored carefully while you are receiving this drug. This drug can cause serious allergic reactions. To reduce your risk, you may need to take drug before treatment with this drug. Take your drug as directed. Do not become pregnant while taking this drug or for 6 months after stopping it. Women should inform their health care provider if they wish to become pregnant or think they might be pregnant. There is potential for serious harm to an unborn child. Talk to your health care provider for more information. This drug may increase your risk of getting an infection. Call your health care provider for advice if you get a fever, chills, or sore throat, or other symptoms of a cold or flu. Do not treat yourself. Try to avoid being around people who are sick. If you have hepatitis B, talk to your health care provider if you plan to stop this drug. The symptoms of hepatitis B may get worse if you stop this drug. In some patients, this drug may cause a serious brain infection that may cause death. If you have any problems seeing, thinking, speaking, walking, or standing, tell your health care provider right away. If you cannot reach your health care provider, urgently seek other source of medical care. This drug can decrease the response to a vaccine. If you need to get vaccinated, tell your health care provider if you have received this drug. Extra   booster doses may be needed. Talk to your health care provider to see if a different vaccination schedule is needed. Talk to your health care provider about your risk of cancer. You may be more at risk for certain types of cancer if you take this drug. What side effects may I notice from receiving this medicine? Side effects that  you should report to your doctor or health care professional as soon as possible:  allergic reactions (skin rash, itching or hives; swelling of the face, lips, or tongue)  facial flushing  fast, irregular heartbeat  infection (fever, chills, cough, sore throat, pain or trouble passing urine)  liver injury (dark yellow or brown urine; general ill feeling or flu-like symptoms; loss of appetite, right upper belly pain; unusually weak or tired, yellowing of the eyes or skin)  low blood pressure (dizziness; feeling faint or lightheaded, falls; unusually weak or tired)  swelling of the ankles, feet, hands  trouble breathing Side effects that usually do not require medical attention (report these to your doctor or health care professional if they continue or are bothersome):  back pain  depressed mood  diarrhea  pain, redness, or irritation at site where injected This list may not describe all possible side effects. Call your doctor for medical advice about side effects. You may report side effects to FDA at 1-800-FDA-1088. Where should I keep my medicine? This drug is given in a hospital or clinic and will not be stored at home. NOTE: This sheet is a summary. It may not cover all possible information. If you have questions about this medicine, talk to your doctor, pharmacist, or health care provider.  2021 Elsevier/Gold Standard (2019-09-12 13:09:45)  

## 2020-12-19 ENCOUNTER — Telehealth: Payer: Self-pay | Admitting: Internal Medicine

## 2020-12-19 NOTE — Telephone Encounter (Signed)
Patient called saying that the empagliflozin (JARDIANCE) 10 MG TABS tablet cost him about $600 and he can not afford it. Patient would like to know if his PCP can prescribe him another medication at a lower price. Patient has [employer] insurance and [facility] pharmacy does not accept the insurance. Please f/u

## 2020-12-20 ENCOUNTER — Ambulatory Visit: Payer: 59 | Admitting: Neurology

## 2020-12-24 ENCOUNTER — Ambulatory Visit: Payer: 59

## 2020-12-25 ENCOUNTER — Ambulatory Visit: Payer: 59 | Attending: Internal Medicine

## 2020-12-25 DIAGNOSIS — Z23 Encounter for immunization: Secondary | ICD-10-CM

## 2020-12-25 NOTE — Progress Notes (Signed)
   [id number]  Name:  Nicholas Kelly.    MRN: [id number] DOB: November 12, 1965  12/25/2020  [name] was observed post Covid-19 immunization for 15 minutes without incident. He was provided with Vaccine Information Sheet and instruction to access the V-Safe system.   [name] was instructed to call 911 with any severe reactions post vaccine: Marland Kitchen Difficulty breathing  . Swelling of face and throat  . A fast heartbeat  . A bad rash all over body  . Dizziness and weakness   Immunizations Administered    Name Date Dose VIS Date Route   Moderna COVID-19 Vaccine 12/25/2020  1:07 PM 0.5 mL 08/14/2020 Intramuscular   Manufacturer: Moderna   Lot: [id number]   NDC: 87867-672-09

## 2020-12-27 ENCOUNTER — Other Ambulatory Visit: Payer: Self-pay | Admitting: Neurology

## 2020-12-27 ENCOUNTER — Telehealth: Payer: Self-pay | Admitting: Neurology

## 2020-12-27 MED ORDER — DIAZEPAM 5 MG PO TABS: ORAL_TABLET | ORAL | 0 refills | Status: DC

## 2020-12-27 NOTE — Telephone Encounter (Signed)
Done.  Patient must be reminded that he needs a driver to and from the MRI facility

## 2020-12-27 NOTE — Telephone Encounter (Signed)
Pt advised of Medication sent to pharmacy. Please take 30-60 minutes prior to mri.   Pt had a question in regards to his PA for the MRI. Advised pt if the MRI is denied we will give him a call to let him know.  Chelsea I see you are working on the Appeal now.

## 2020-12-27 NOTE — Telephone Encounter (Signed)
Patient has MRI schedule and needs something to help him relax during the medication.   He uses the [facility] neighborhood  In [place]

## 2020-12-30 NOTE — Telephone Encounter (Signed)
Are you able to look up other medication options that his insurance covers? If not please forward to Butch Penny, PharmD for assistance.   Marcy Siren, D.O. Primary Care at [facility]  12/30/2020, 4:25 PM

## 2020-12-30 NOTE — Telephone Encounter (Signed)
Luke or [place],   [facility] informed me that Nicholas Kelly is on the formulary and should be $37.50 per 90 day supply. Are you able to look further into this or offer any other assistance with this? Thanks!

## 2020-12-31 NOTE — Telephone Encounter (Signed)
[facility] does now accept [facility] as of yesterdays date!

## 2020-12-31 NOTE — Telephone Encounter (Signed)
I am having trouble reaching [facility].  I have been calling for the last hour and the automated service is on a loop and now there is a busy signal.  When I ran a mock claim through at our pharmacy, 30-days went thru for $15.  I will keep trying to get [facility] on the phone to see what copay they are coming up with today.

## 2020-12-31 NOTE — Telephone Encounter (Signed)
Left detailed message on VM informing pt of this (okay per DPR) and advised to follow-up w/ [facility] pharmacy if he wishes to get med from there.

## 2021-01-04 ENCOUNTER — Other Ambulatory Visit: Payer: 59

## 2021-01-06 ENCOUNTER — Ambulatory Visit (INDEPENDENT_AMBULATORY_CARE_PROVIDER_SITE_OTHER): Payer: 59 | Admitting: Cardiovascular Disease

## 2021-01-06 ENCOUNTER — Other Ambulatory Visit: Payer: Self-pay

## 2021-01-06 ENCOUNTER — Encounter: Payer: Self-pay | Admitting: Cardiovascular Disease

## 2021-01-06 ENCOUNTER — Other Ambulatory Visit: Payer: Self-pay | Admitting: Cardiovascular Disease

## 2021-01-06 VITALS — BP 162/102 | HR 87 | Ht 68.0 in | Wt 315.2 lb

## 2021-01-06 DIAGNOSIS — I10 Essential (primary) hypertension: Secondary | ICD-10-CM

## 2021-01-06 DIAGNOSIS — I5042 Chronic combined systolic (congestive) and diastolic (congestive) heart failure: Secondary | ICD-10-CM

## 2021-01-06 DIAGNOSIS — I428 Other cardiomyopathies: Secondary | ICD-10-CM | POA: Diagnosis not present

## 2021-01-06 DIAGNOSIS — G35 Multiple sclerosis: Secondary | ICD-10-CM

## 2021-01-06 DIAGNOSIS — E785 Hyperlipidemia, unspecified: Secondary | ICD-10-CM

## 2021-01-06 DIAGNOSIS — Z79899 Other long term (current) drug therapy: Secondary | ICD-10-CM

## 2021-01-06 MED ORDER — SPIRONOLACTONE 25 MG PO TABS: 12.5000 mg | ORAL_TABLET | Freq: Every day | ORAL | 3 refills | Status: DC

## 2021-01-06 NOTE — Progress Notes (Unsigned)
Cardiology Office Note    Date:  01/08/2021   ID:  Nicholas Jefferson., DOB 1966-03-11, MRN [id number]  PCP:  Nicolette Bang, DO  Cardiologist:  [name], MD   Follow-up visit.  History of Present Illness:  Nicholas Mczeal. is a 55 y.o. male who presents for a 50 month follow-up cardiology evaluation.   Nicholas Kelly has a history of hypertension, and presented to [facility] hospital on 02/10/2017 from urgent care with acute onset of shortness of breath.  He was previously diagnosed with CHF in October 17 and had been on HCTZ but had run out of this prescription.  He was admitted by the hospitalist service.  A chest x-ray showed findings of CHF with pulmonary edema.  BNP was elevated at 752.  An echo Doppler study showed reduced LV function with an EF of 20-25% with moderate concentric LVH, diffuse hypokinesis, grade 3 diastolic dysfunction, mild mitral regurgitation, moderate tricuspid regurgitation, and moderate bony hypertension with a PA pressure 51 mm.  Patient was seen in cardiology consultation.  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  He did not have significant obstructive CAD with only diffuse 30-50% mid and distal RCA stenoses.  There is moderate pulmonary hypertension.  He was discharged on 02/14/2017 and has been on lisinopril 5 mg, furosemide 40 mg, carvedilol 6.25 mg twice a day in addition to aspirin 81 mg, supplemental potassium 20, now quit once and atorvastatin 40 mg.     I saw for initial post hospital evaluation on 03/11/2017.  I reviewed his hospital data in its entirety.  He was feeling improved on his initial medical regimen and I recommended further titration of his lisinopril up to 10 mg and increase carvedilol to 12.5 mg twice a day.  He did not have insurance and ws working on trying to obtain a  Medicaid card.  We talked in the future about possibly switching him to entresto.  On his increase regimen, he has felt improved.    When I saw him in June 2018 his blood pressure continued to be elevated and I recommended the addition of spironolactone at 12.5 mg for one week and then to increase this to 12.5 mg twice a day.  I also titrated lisinopril from 10 mg to 15 mg. On July 17, 2017 he presented to the hospital with complaints of diplopia and visual disturbance. 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  An MRI of the range, did not show acute abnormality but revealed chronic ischemic microangiopathy.  He had a negative CTA, except for minimal calcified atherosclerosis involving only the carotids.  During his hospitalization.  A repeat echo Doppler study showed an EF of 30% with grade 1 diastolic dysfunction.  His aortic root was mildly dilated.  Neurology was consulted and he was felt to have had a likely CVA given his reduced LV function.  He underwent outpatient neurologic rehab.  When I saw him in October 2018 blood pressure was still elevated and I further titrated lisinopril to 20 mg.  He was also taking Spironolactone 12.5 mg twice a day, furosemide 40 mg daily, carvedilol 12.5 twice daily in addition to atorvastatin 80 mg and Plavix and aspirin.  When I saw him in April 2019 he had run out of  his lisinopril for the last 2 months.  His allergist had increase aspirin to 325 and he had continued to be on Plavix, carvedilol 12.5 twice a day, furosemide 40 mg and HCTZ 25 mg.  He had lost 35 pounds since October 2018.  He was no longer taking Spironolactone.  With his reduced EF I felt he was a good candidate for initiation of Entresto.  He was gradually been titrated up to maximum dose 97/103 and  saw Nicholas Kelly off Nicholas Kelly in May 2019 at which time the dose was titrated to maximum.  Apparently he took that dose for a  month since he had a 30-day free samples but unfortunately when he brought the prescription to [facility] they were unable to fill it since he had no insurance.  When I last saw him on July 26, 2018 he was restarted with samples of and on Entresto at 49/51 mg.  At the time he also was on carvedilol 12.5 mg twice a day, furosemide 60 mg daily without edema.  He was on atorvastatin 80 mg for hyperlipidemia.    I last saw him in January 2020 and over the past several months prior to that evaluation he  felt improved with reinitiation of Entresto therapy.  At times he still noted some abdominal bloating.  He was  on carvedilol 12.5 twice a day, clonidine 0.1 mg twice a day, furosemide 60 mg daily and apparently HCTZ 25 mg.  During that evaluation, he had no signs of edema and appears euvolemic on exam.  So that I could further titrate Entresto back to maximum dosing, I suggested he discontinue HCTZ and also suggested he decrease Lasix.  For several weeks I also recommended slight additional titration of carvedilol.  At his last office visit with me in August 2020 he was no longer taking Entresto since his samples had run out over 3 months previously.  As result he noticed recurrent symptomatology with shortness of breath.  During that evaluation, I provided him with samples of Entresto 24/26 for him to take twice a day for 2 weeks and gave him another prescription for 49/51 for titration.  He was scheduled to see his pharmacist for follow-up evaluation.  I have not seen him since August 2020 but he has been evaluated by Nicholas Kelly subsequent  to my evaluation.  Presently, he is no longer on Entresto and has been on carvedilol 12.5 mg twice a day, furosemide 80 mg daily, losartan 50 mg daily.  He apparently has been taking supplemental potassium, clonidine 0.1 mg twice a day and continues to be on atorvastatin 80 mg for hyperlipidemia and Metformin 500 mg twice a day for diabetes mellitus.  He is  followed by Nicholas Kelly for his multiple sclerosis.  Recent LDL level was 104.  He presents for evaluation.  Past Medical History:  Diagnosis Date  . Acute CHF (congestive heart failure) ([place]) 02/10/2017  . Dyspnea   . Hypertension   . Stroke [facility])     Past Surgical History:  Procedure Laterality Date  . NO PAST SURGERIES    . RIGHT/LEFT HEART CATH AND CORONARY ANGIOGRAPHY N/A 02/12/2017   Procedure: Right/Left Heart Cath and Coronary Angiography;  Surgeon: Belva Crome, MD;  Location: [place] [facility];  Service: Cardiovascular;  Laterality: N/A;    Current Medications: Outpatient Medications Prior to Visit  Medication Sig Dispense Refill  . aspirin 81 MG tablet Take 1 tablet (81 mg total) by mouth daily. 30 tablet 3  .  atorvastatin (LIPITOR) 80 MG tablet Take 1 tablet (80 mg total) by mouth daily at 6 PM. 30 tablet 10  . Blood Glucose Monitoring Suppl (TRUE METRIX METER) w/Device KIT Use as instructed to check blood sugar BID. 1 kit 0  . carvedilol (COREG) 12.5 MG tablet Take 1 tablet (12.5 mg total) by mouth 2 (two) times daily with a meal. 60 tablet 10  . cloNIDine (CATAPRES) 0.1 MG tablet Take 1 tablet (0.1 mg total) by mouth 2 (two) times daily. 60 tablet 10  . empagliflozin (JARDIANCE) 10 MG TABS tablet Take 1 tablet (10 mg total) by mouth daily before breakfast. 30 tablet 2  . furosemide (LASIX) 80 MG tablet Take 1 tablet (80 mg total) by mouth daily. 30 tablet 5  . glucose blood test strip Use as instructed to check blood sugar twice daily. 100 each 6  . losartan (COZAAR) 50 MG tablet Take 1 tablet (50 mg total) by mouth daily. 30 tablet 6  . metFORMIN (GLUCOPHAGE) 500 MG tablet Take 2 tablets (1,000 mg total) by mouth 2 (two) times daily with a meal. 120 tablet 2  . TRUEplus Lancets 28G MISC Use to check blood sugar twice daily. 100 each 6  . potassium chloride SA (KLOR-CON) 20 MEQ tablet Take 1 tablet (20 mEq total) by mouth daily. 30 tablet 10  . diazepam (VALIUM) 5 MG  tablet Take 1 tablet 30-40 minutes prior to MRI 1 tablet 0   No facility-administered medications prior to visit.     Allergies:   Patient has no known allergies.   Social History   Socioeconomic History  . Marital status: Divorced    Spouse name: Not on file  . Number of children: 4  . Years of education: Not on file  . Highest education level: High school graduate  Occupational History  . Not on file  Tobacco Use  . Smoking status: Former Smoker    Packs/day: 1.00    Years: 34.00    Pack years: 34.00    Types: Cigarettes    Quit date: 06/28/2016    Years since quitting: 4.5  . Smokeless tobacco: Never Used  Vaping Use  . Vaping Use: Never used  Substance and Sexual Activity  . Alcohol use: Not Currently    Comment: stopped in 2018  . Drug use: Yes    Comment: 02/10/2017 "experimented w/drugs; haven't done any for years"  . Sexual activity: Not Currently  Other Topics Concern  . Not on file  Social History Narrative   Lives in one story home with daughter; Left handed; some college; caffeine - occasional coffee not daily; walks 2xweek   Social Determinants of Health   Financial Resource Strain: Not on file  Food Insecurity: Not on file  Transportation Needs: Not on file  Physical Activity: Not on file  Stress: Not on file  Social Connections: Not on file     Family History:  The patient's family history includes Cardiomyopathy in his mother; Hypertension in his mother; Multiple sclerosis in his sister; Stroke in his maternal aunt.   ROS General: Negative; No fevers, chills, or night sweats;  HEENT: positive for recent visual disturbance. sinus congestion, difficulty swallowing Pulmonary: Negative; No cough, wheezing, shortness of breath, hemoptysis Cardiovascular: See HPI GI: Negative; No nausea, vomiting, diarrhea, or abdominal pain GU: Negative; No dysuria, hematuria, or difficulty voiding Musculoskeletal: Negative; no myalgias, joint pain, or  weakness Hematologic/Oncology: Negative; no easy bruising, bleeding Endocrine: Negative; no heat/cold intolerance; no diabetes Neuro: positive  for previous diplopia Skin: Negative; No rashes or skin lesions Psychiatric: Negative; No behavioral problems, depression Sleep: Negative; No snoring, daytime sleepiness, hypersomnolence, bruxism, restless legs, hypnogognic hallucinations, no cataplexy Other comprehensive 14 point system review is negative.   PHYSICAL EXAM:   VS:  BP (!) 162/102 (BP Location: Left Arm, Patient Position: Sitting)   Pulse 87   Ht _0  (1.727 m)   Wt (!) 315 lb 3.2 oz (143 kg)   SpO2 93%   BMI 47.93 kg/m     Repeat blood pressure by me was 132/94 which was improved from his initial markedly elevated blood pressure upon arrival  Wt Readings from Last 3 Encounters:  01/06/21 (!) 315 lb 3.2 oz (143 kg)  12/13/20 (!) 317 lb (143.8 kg)  11/21/20 (!) 309 lb 6.4 oz (140.3 kg)    General: Alert, oriented, no distress.  Skin: normal turgor, no rashes, warm and dry HEENT: Normocephalic, atraumatic. Pupils equal round and reactive to light; sclera anicteric; extraocular muscles intact;  Nose without nasal septal hypertrophy Mouth/Parynx benign; Mallinpatti scale 3 Neck: No JVD, no carotid bruits; normal carotid upstroke Lungs: clear to ausculatation and percussion; no wheezing or rales Chest wall: without tenderness to palpitation Heart: PMI not displaced, RRR, s1 s2 normal, 1/6 systolic murmur, no diastolic murmur, no rubs, gallops, thrills, or heaves Abdomen: soft, nontender; no hepatosplenomehaly, BS+; abdominal aorta nontender and not dilated by palpation. Back: no CVA tenderness Pulses 2+ Musculoskeletal: full range of motion, normal strength, no joint deformities Extremities: no clubbing cyanosis or edema, Homan's sign negative  Neurologic: grossly nonfocal; Cranial nerves grossly wnl Psychologic: Normal mood and affect   Studies/Labs Reviewed:   ECG  (independently read by me): Sinus rhythm at 87, PVCs, RBBB, LAHB, LVH  June 15, 2019 ECG (independently read by me): NSR at 64; RBBB  November 22, 2018 ECG (independently read by me): Sinus rhythm with occasional PACs as well as PVC.  Left axis deviation.  Right bundle branch block.  LVH by voltage.  July 26, 2018 ECG (independently read by me): Sinus bradycardia at 53 bpm.  Right bundle branch block. LVH by voltage; normal intervals  February 20, 2018 ECG (independently read by me): Normal sinus rhythm at 74 bpm.  Left axis deviation.  Right bundle branch block.  LVH by voltage criteria.  QTc interval 492 ms.  August 18 2017 ECG (independently read by me): Normal sinus rhythm at 66 bpm.  Right bundle branch block with repolarization changes.  Inferolateral T change.  T-wave changes.  LVH.  QTc interval 461 ms.  04/21/2017 EKG:  EKG is ordered today. Normal sinus rhythm at 77 bpm.  LVH with repolarization changes.  PR interval 184 ms.  QTc interval 488 ms.  03/11/2017 ECG (independently read by me): Normal sinus rhythm at 73 bpm.  Left axis deviation.  LVH by voltage criteria with repolarization changes.  Recent Labs: BMP Latest Ref Rng & Units 06/27/2020 03/27/2020 03/18/2020  Glucose 65 - 99 mg/dL 108(H) 392(H) 539(HH)  BUN 6 - 24 mg/dL _1 Creatinine 0.76 - 1.27 mg/dL 1.23 1.04 1.32(H)  BUN/Creat Ratio 9 - _2 Sodium 134 - 144 mmol/L 143 136 136  Potassium 3.5 - 5.2 mmol/L 4.7 4.7 4.8  Chloride 96 - 106 mmol/L 104 99 96  CO2 20 - 29 mmol/L _3 Calcium 8.7 - 10.2 mg/dL 9.9 9.2 9.8     Hepatic Function Latest Ref Rng & Units 11/09/2019 09/29/2019  06/15/2019  Total Protein 6.1 - 8.1 g/dL 7.5 - 7.5  Albumin 3.5 - 5.2 g/dL - 3.4(L) 3.6  AST 10 - 35 U/L 18 - 20  ALT 9 - 46 U/L 20 - 18  Alk Phosphatase 38 - 126 U/L - - 61  Total Bilirubin 0.2 - 1.2 mg/dL 0.7 - 0.7  Bilirubin, Direct 0.1 - 0.5 mg/dL - - -    CBC Latest Ref Rng & Units 08/22/2020 10/11/2019 06/15/2019   WBC 3.4 - 10.8 x10E3/uL 9.8 7.8 -  Hemoglobin 13.0 - 17.7 g/dL 12.6(L) 13.1 14.3  Hematocrit 37.5 - 51.0 % 41.2 38.9 42.0  Platelets 150 - 450 x10E3/uL 343 - -   Lab Results  Component Value Date   MCV 97 08/22/2020   MCV 96 10/11/2019   MCV 100.7 (H) 06/15/2019   Lab Results  Component Value Date   TSH 2.520 09/20/2018   Lab Results  Component Value Date   HGBA1C 5.5 11/21/2020     BNP    Component Value Date/Time   BNP 7.2 12/18/2019 1556   BNP 752.4 (H) 02/10/2017 1525    ProBNP    Component Value Date/Time   PROBNP 123 (H) 07/06/2019 1432     Lipid Panel     Component Value Date/Time   CHOL 158 11/21/2020 1127   TRIG 74 11/21/2020 1127   HDL 40 11/21/2020 1127   CHOLHDL 4.0 11/21/2020 1127   CHOLHDL 4.9 07/18/2017 0558   VLDL 11 07/18/2017 0558   LDLCALC 104 (H) 11/21/2020 1127     RADIOLOGY: No results found.   Additional studies/ records that were reviewed today include:  I reviewed the patient's recent hospitalization, his cardiac consultation, cardiac catheterization report, and hospitalist records. I reviewed his hospitalization from September 22 through 07/19/2017 including imaging studies and echocardiogram.   ASSESSMENT:    1. Non-ischemic cardiomyopathy ([place])   2. Chronic combined systolic and diastolic CHF (congestive heart failure) ([place])   3. Essential hypertension   4. Medication management   5. Morbid obesity ([place])   6. Multiple sclerosis ([place])   7. Hyperlipidemia, unspecified hyperlipidemia type     PLAN:  Nicholas Kelly is a 55 year-old African-American gentleman who has a history of morbid obesity and has documented reduced EF noted in April 2018.  He has been an excellent candidate for [facility] and previously had been uptitrated to 97/103 twice daily with improvement but unfortunately had stopped therapy when he could not get additional medication.  He  was able to be restarted back on Entresto and  was titrated to 49/51 and  later 97/103 mm successfully.  Fortunately, again he stopped using Entresto when his samples ran out which he believes may have been sometime in March or April 2020.  When I last saw him in August 2020 I again reinstituted therapy.  Unfortunately again due to cost issues he ultimately stopped therapy with Entresto.  Most recently he is on carvedilol 12.5 mg twice a day, losartan 50 mg daily, furosemide 80 mg daily and has been taking supplemental potassium.  His blood pressure today is elevated.  I have recommended he discontinue supplemental potassium and I am adding spironolactone 12.5 mg daily.  He continues also to be on Jardiance 10 mg and is tolerating this well.  In 2 weeks I am scheduling him for a comprehensive metabolic panel.  I have recommended a follow-up echo Doppler study in 3 months.  He continues to be on a atorvastatin 80 mg daily for  hyperlipidemia and I have recommended Zetia 10 mg with target LDL less than 70.  His multiple sclerosis is being followed by Nicholas Kelly.  I will see him in 4 months for reevaluation.   Medication Adjustments/Labs and Tests Ordered: Current medicines are reviewed at length with the patient today.  Concerns regarding medicines are outlined above.  Medication changes, Labs and Tests ordered today are listed in the Patientt Instructions below. Patient Instructions  Medication Instructions:  Stop taking potassium supplement  Start taking Spirnolactone 12.5 mg ( 1/2 tablet of 25 mg) daily  *If you need a refill on your cardiac medications before your next appointment, please call your pharmacy*   Lab Work: [facility] -please due week of March 28 , 2022 If you have labs (blood work) drawn today and your tests are completely normal, you will receive your results only by: Marland Kitchen MyChart Message (if you have MyChart) OR . A paper copy in the mail If you have any lab test that is abnormal or we need to change your treatment, we will call you to review the  results.   Testing/Procedures:  will be schedule at [facility] [address]- May 2022 Your physician has requested that you have an echocardiogram. Echocardiography is a painless test that uses sound waves to create images of your heart. It provides your doctor with information about the size and shape of your heart and how well your heart's chambers and valves are working. This procedure takes approximately one hour. There are no restrictions for this procedure.     Follow-Up: At [facility], you and your health needs are our priority.  As part of our continuing mission to provide you with exceptional heart care, we have created designated Provider Care Teams.  These Care Teams include your primary Cardiologist (physician) and Advanced Practice Providers (APPs -  Physician Assistants and Nurse Practitioners) who all work together to provide you with the care you need, when you need it.  We recommend signing up for the patient portal called "MyChart".  Sign up information is provided on this After Visit Summary.  MyChart is used to connect with patients for Virtual Visits (Telemedicine).  Patients are able to view lab/test results, encounter notes, upcoming appointments, etc.  Non-urgent messages can be sent to your provider as well.   To learn more about what you can do with MyChart, go to [email or web address].    Your next appointment:   4 month(s)  The format for your next appointment:   In Person  Provider:   [name], MD   Other Instructions     Signed, [name], MD , [facility] 01/08/2021 6:44 PM    [facility] [address] [place], [place], [place]  [address] Phone: [phone]

## 2021-01-06 NOTE — Patient Instructions (Signed)
Medication Instructions:  Stop taking potassium supplement  Start taking Spirnolactone 12.5 mg ( 1/2 tablet of 25 mg) daily  *If you need a refill on your cardiac medications before your next appointment, please call your pharmacy*   Lab Work: [facility] -please due week of March 28 , 2022 If you have labs (blood work) drawn today and your tests are completely normal, you will receive your results only by: Marland Kitchen MyChart Message (if you have MyChart) OR . A paper copy in the mail If you have any lab test that is abnormal or we need to change your treatment, we will call you to review the results.   Testing/Procedures:  will be schedule at [facility] [address]- May 2022 Your physician has requested that you have an echocardiogram. Echocardiography is a painless test that uses sound waves to create images of your heart. It provides your doctor with information about the size and shape of your heart and how well your heart's chambers and valves are working. This procedure takes approximately one hour. There are no restrictions for this procedure.     Follow-Up: At [facility], you and your health needs are our priority.  As part of our continuing mission to provide you with exceptional heart care, we have created designated Provider Care Teams.  These Care Teams include your primary Cardiologist (physician) and Advanced Practice Providers (APPs -  Physician Assistants and Nurse Practitioners) who all work together to provide you with the care you need, when you need it.  We recommend signing up for the patient portal called "MyChart".  Sign up information is provided on this After Visit Summary.  MyChart is used to connect with patients for Virtual Visits (Telemedicine).  Patients are able to view lab/test results, encounter notes, upcoming appointments, etc.  Non-urgent messages can be sent to your provider as well.   To learn more about what you can do with MyChart, go to  [email or web address].    Your next appointment:   4 month(s)  The format for your next appointment:   In Person  Provider:   Nicki Guadalajara, MD   Other Instructions

## 2021-01-08 ENCOUNTER — Other Ambulatory Visit: Payer: 59

## 2021-01-08 ENCOUNTER — Encounter: Payer: Self-pay | Admitting: Cardiovascular Disease

## 2021-02-02 IMAGING — MR MR HEAD WO/W CM
13 series · 48 of 48 positions shown · IV contrast (20ml Multihance)
Comparison: Brain MRI 06/16/2019.

CLINICAL DATA: 53-year-old male with left upper extremity weakness
with possible brain and cervical cord demyelinating disease on MRI
in [REDACTED].

EXAM:
MRI HEAD WITHOUT AND WITH CONTRAST
TECHNIQUE: Multiplanar, multiecho pulse sequences of the brain and surrounding
structures were obtained without and with intravenous contrast.
CONTRAST:  20mL MULTIHANCE GADOBENATE DIMEGLUMINE 529 MG/ML IV SOLN

[Series 2: t1_se_sag · sagittal · 5.0mm · 0.47mm/px · 1 of 22 slices shown]
[im 1/22]
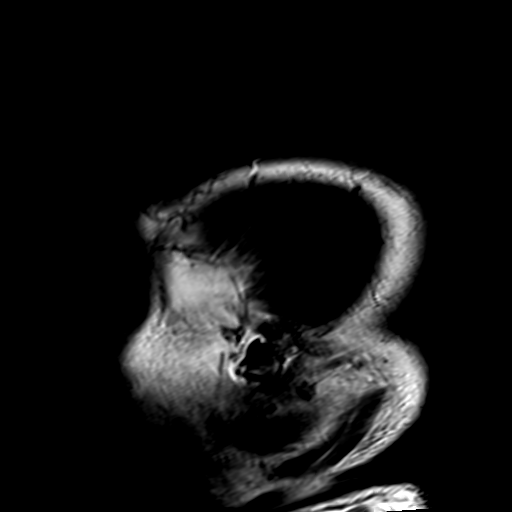

[Series 3: ep2d_diff_3 · axial · 3.0mm · 1.80mm/px · z∈[-41,+109]mm · 5 of 103 slices shown]
[im 1/103]
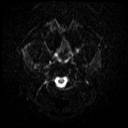
[im 26/103]
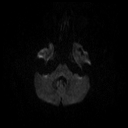
[im 52/103]
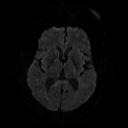
[im 77/103]
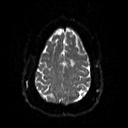
[im 103/103]
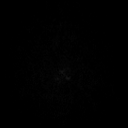

[Series 4: ep2d_diff_3_adc · axial · 3.0mm · 1.80mm/px · z∈[-41,+109]mm · 3 of 52 slices shown]
[im 1/52]
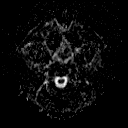
[im 26/52]
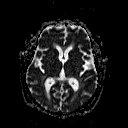
[im 52/52]
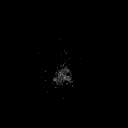

[Series 5: ep2d_diff_cor · coronal · 5.0mm · 1.77mm/px · 4 of 56 slices shown]
[im 1/56]
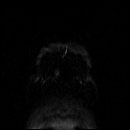
[im 19/56]
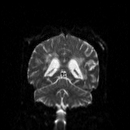
[im 37/56]
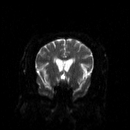
[im 56/56]
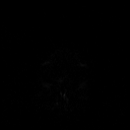

[Series 6: ep2d_diff_cor_adc · coronal · 5.0mm · 1.77mm/px · 2 of 28 slices shown]
[im 1/28]
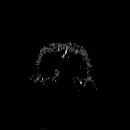
[im 28/28]
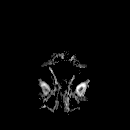

[Series 7: FLAIR · axial · 3.0mm · 0.45mm/px · z∈[-46,+113]mm · 2 of 28 slices shown (1 of 2)]
[im 1/28]
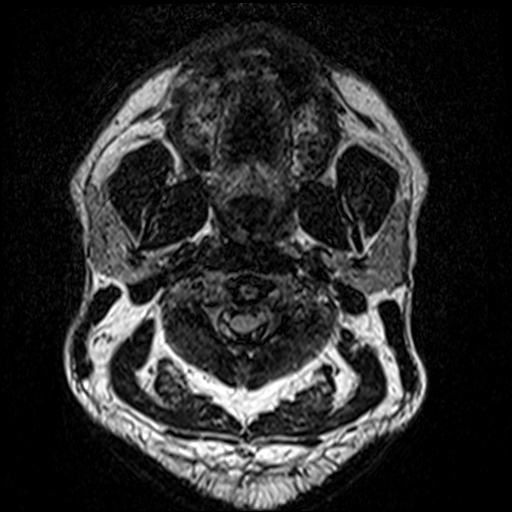
[im 28/28]
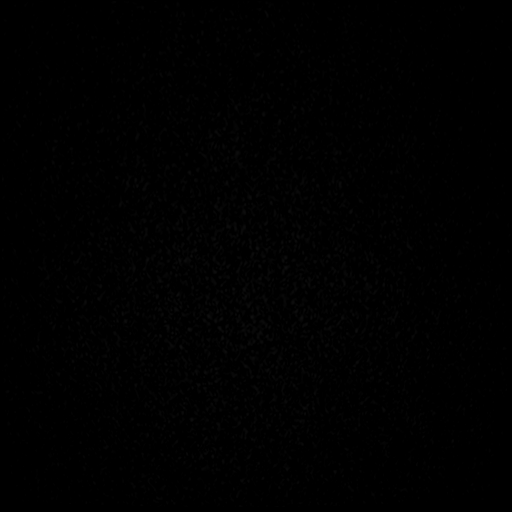

[Series 8: t2_tse_tra · axial · 5.0mm · 0.60mm/px · z∈[-40,+107]mm · 2 of 26 slices shown]
[im 1/26]
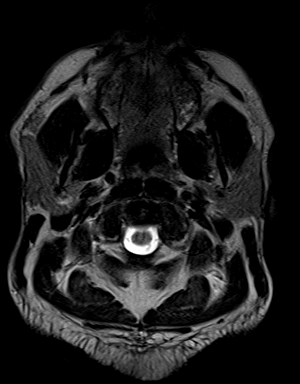
[im 26/26]
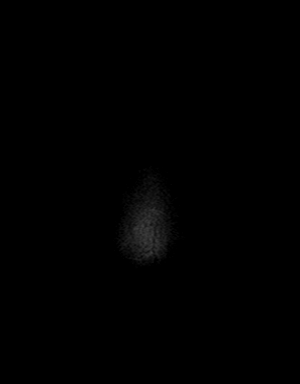

[Series 10: swi_images · axial · 2.0mm · 0.90mm/px · z∈[-44,+111]mm · 5 of 80 slices shown]
[im 1/80]
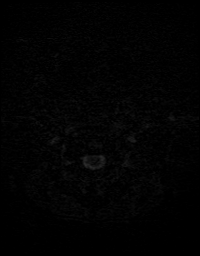
[im 20/80]
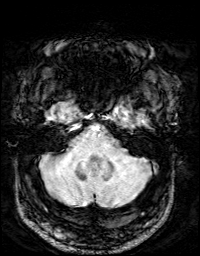
[im 40/80]
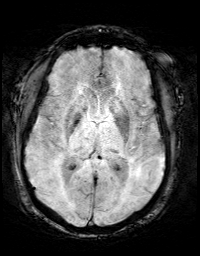
[im 60/80]
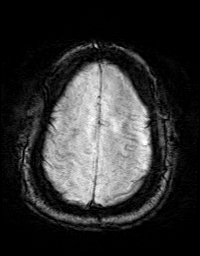
[im 80/80]
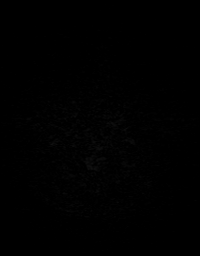

[Series 11: t1_mpr_tra · axial · 1.0mm · 0.72mm/px · z∈[-37,+103]mm · 9 of 144 slices shown]
[im 1/144]
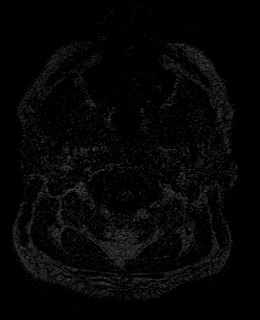
[im 18/144]
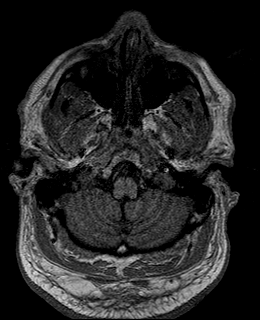
[im 36/144]
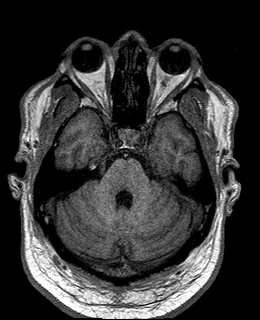
[im 54/144]
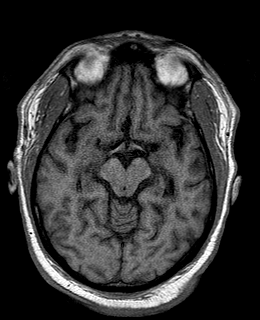
[im 72/144]
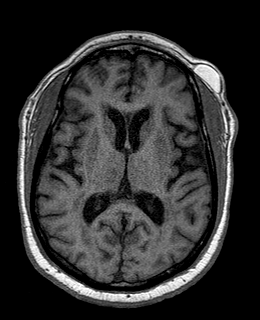
[im 90/144]
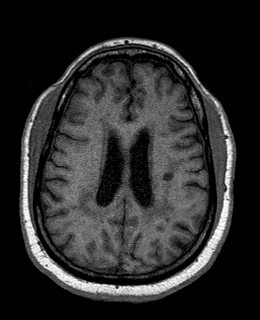
[im 108/144]
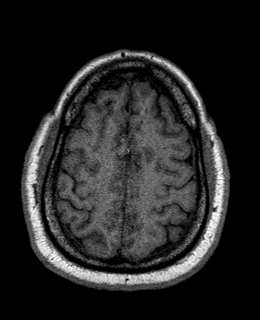
[im 126/144]
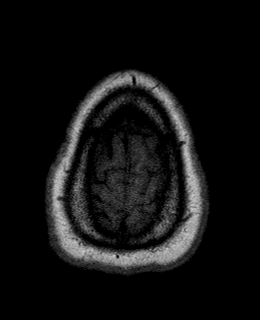
[im 144/144]
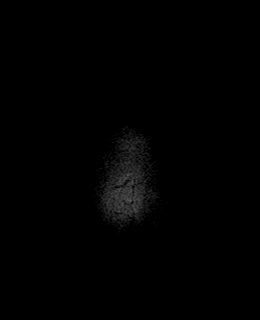

[Series 12: T2 post-contrast · coronal · 5.0mm · 0.45mm/px · 2 of 30 slices shown]
[im 1/30]
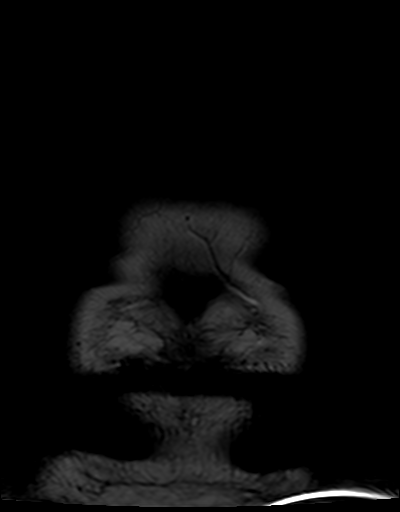
[im 30/30]
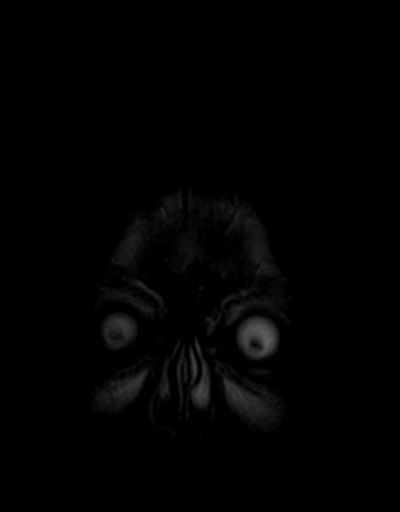

[Series 13: FLAIR · sagittal · 5.0mm · 0.45mm/px · 2 of 25 slices shown (2 of 2)]
[im 1/25]
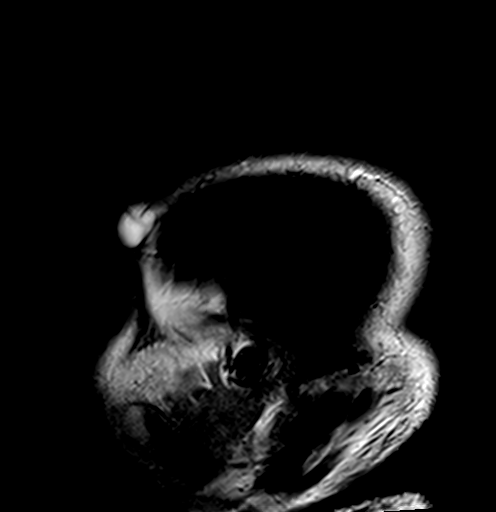
[im 25/25]
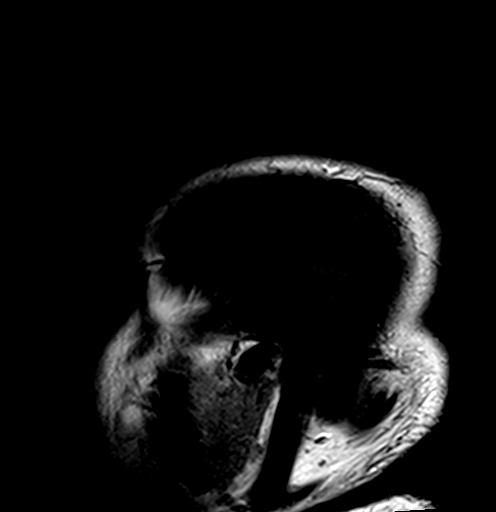

[Series 14: post t1_mpr_tra · axial · 1.0mm · 0.72mm/px · z∈[-37,+103]mm · 9 of 144 slices shown]
[im 1/144]
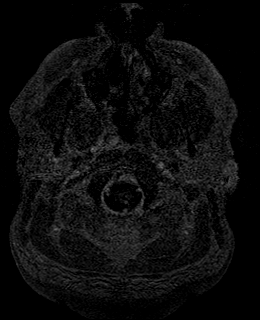
[im 18/144]
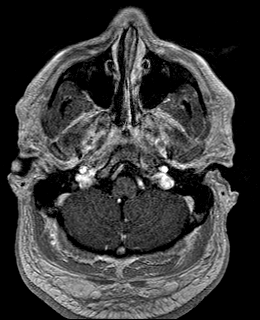
[im 36/144]
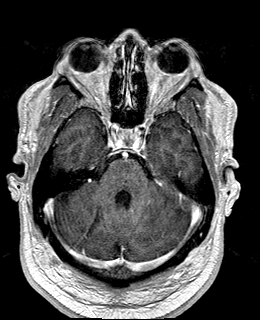
[im 54/144]
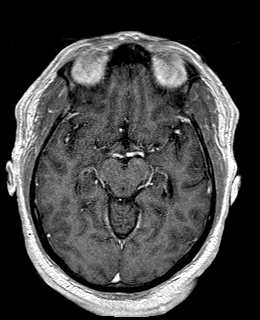
[im 72/144]
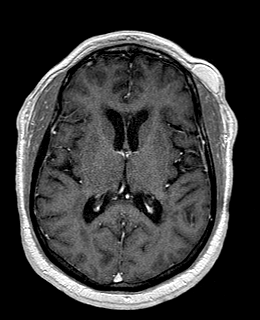
[im 90/144]
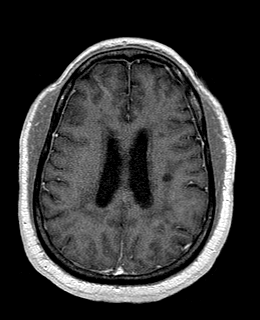
[im 108/144]
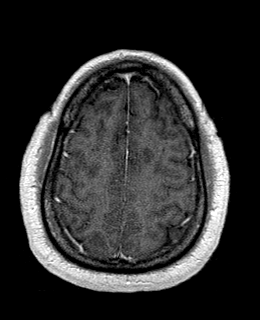
[im 126/144]
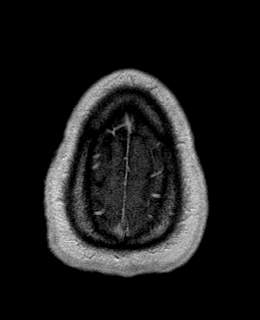
[im 144/144]
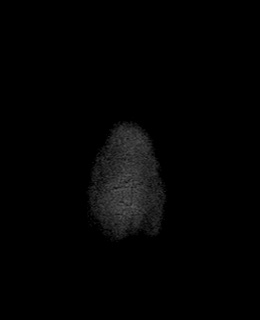

[Series 15: T1 post-contrast · coronal · 5.0mm · 0.72mm/px · 2 of 30 slices shown]
[im 1/30]
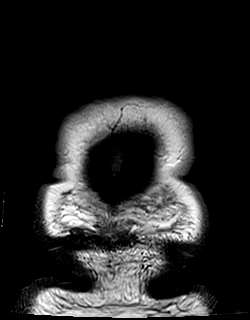
[im 30/30]
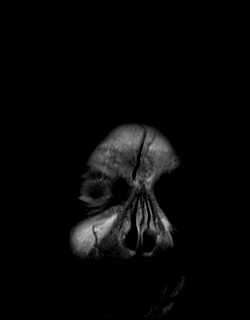

[48 of 48 positions shown; findings below may reference images not displayed]

FINDINGS: Brain: Patchy and confluent bilateral cerebral white matter T2 and
FLAIR hyperintensity appears unchanged since [REDACTED]. Bilateral
temporal lobe involvement (series 7, images 11 and 12. Some corpus
callosum volume loss (series 2, image 12). And on sagittal FLAIR
images some of the largest left hemisphere lesions are oriented
perpendicular to the ventricles (series 13, image 16).

T2 heterogeneity in the thalami, appears increased on the right
(series 8, image 12). Circumscribed T2 heterogeneity in the pons.
Confluent abnormal T2 and FLAIR hyperintensity also in the dorsal
brainstem, ventral surface of the 4th ventricle (series 8 image 7).

The cerebellum is relatively spared.

Superimposed right parietal lobe post sensory cortical
encephalomalacia on series 8, image 18, although with associated
hemosiderin (series 10, image 54). This is stable since [REDACTED]. No
other cortical encephalomalacia identified.

No restricted diffusion or evidence of acute infarction. No
enhancing lesion or abnormal enhancement. No dural thickening.

No midline shift, mass effect, evidence of mass lesion,
ventriculomegaly, extra-axial collection or acute intracranial
hemorrhage. Cervicomedullary junction and pituitary are within
normal limits.

Vascular: Major intracranial vascular flow voids are preserved. Mild
intracranial artery tortuosity. The major dural venous sinuses are
enhancing and appear to be patent.

Skull and upper cervical spine: Cervical spine reported separately
today. Visualized bone marrow signal is within normal limits.

Sinuses/Orbits: Grossly negative orbits. Paranasal sinuses and
mastoids are stable and well pneumatized.

Other: Prominent left lateral forehead lipoma (series 11, image 71).
Visible internal auditory structures appear normal.
IMPRESSION: 1. Extensive signal abnormality in the cerebral white matter with
features strongly suggestive of chronic demyelinating disease, as
well as other signal changes which are also atypical for ischemia
(confluent T2/FLAIR hyperintensity in the dorsal brainstem/ventral
surface of the 4th ventricle).
2. However, superimposed chronic ischemic disease is also suspected,
including in the right parietal lobe (with associated hemosiderin)
and possibly also the pons.
3. No active demyelination or acute intracranial abnormality
identified.

## 2021-02-22 ENCOUNTER — Other Ambulatory Visit: Payer: 59

## 2021-02-25 ENCOUNTER — Other Ambulatory Visit: Payer: 59

## 2021-03-03 ENCOUNTER — Telehealth: Payer: Self-pay | Admitting: Neurology

## 2021-03-03 ENCOUNTER — Ambulatory Visit
Admission: RE | Admit: 2021-03-03 | Discharge: 2021-03-03 | Disposition: A | Discharge status: Home / Self Care | Payer: 59 | Source: Ambulatory Visit | Attending: Neurology | Admitting: Neurology

## 2021-03-03 ENCOUNTER — Other Ambulatory Visit: Payer: Self-pay | Admitting: Neurology

## 2021-03-03 ENCOUNTER — Other Ambulatory Visit: Payer: Self-pay

## 2021-03-03 DIAGNOSIS — M4802 Spinal stenosis, cervical region: Secondary | ICD-10-CM

## 2021-03-03 DIAGNOSIS — G35 Multiple sclerosis: Secondary | ICD-10-CM

## 2021-03-03 MED ORDER — DIAZEPAM 5 MG PO TABS: ORAL_TABLET | ORAL | 0 refills | Status: DC

## 2021-03-03 MED ORDER — GADOBENATE DIMEGLUMINE 529 MG/ML IV SOLN
20.0000 mL | Freq: Once | INTRAVENOUS | Status: AC | PRN
  Administered 2021-03-03: 20 mL via INTRAVENOUS

## 2021-03-03 NOTE — Telephone Encounter (Signed)
Pt advised.

## 2021-03-03 NOTE — Telephone Encounter (Signed)
Patient has an MRI this afternoon and he is requesting a medicine to help with his anxiety during the test.  [facility] [address]

## 2021-03-05 ENCOUNTER — Other Ambulatory Visit: Payer: Self-pay | Admitting: Neurology

## 2021-03-05 NOTE — Progress Notes (Signed)
Pt advised of his MRI results. Pt has two more MRI and he will need a script to keep him calm during those mri's as well.   Please advise.

## 2021-03-06 ENCOUNTER — Telehealth: Payer: Self-pay | Admitting: Neurology

## 2021-03-06 ENCOUNTER — Other Ambulatory Visit: Payer: Self-pay | Admitting: Neurology

## 2021-03-06 ENCOUNTER — Other Ambulatory Visit (HOSPITAL_COMMUNITY): Payer: 59

## 2021-03-06 MED ORDER — DIAZEPAM 5 MG PO TABS: ORAL_TABLET | ORAL | 0 refills | Status: DC

## 2021-03-06 NOTE — Telephone Encounter (Signed)
Done

## 2021-03-06 NOTE — Telephone Encounter (Signed)
I have already sent a prescription for Valium to his pharmacy

## 2021-03-10 ENCOUNTER — Ambulatory Visit
Admission: RE | Admit: 2021-03-10 | Discharge: 2021-03-10 | Disposition: A | Discharge status: Home / Self Care | Payer: 59 | Source: Ambulatory Visit | Attending: Neurology | Admitting: Neurology

## 2021-03-10 ENCOUNTER — Other Ambulatory Visit: Payer: Self-pay

## 2021-03-10 DIAGNOSIS — M4802 Spinal stenosis, cervical region: Secondary | ICD-10-CM

## 2021-03-10 MED ORDER — GADOBENATE DIMEGLUMINE 529 MG/ML IV SOLN
20.0000 mL | Freq: Once | INTRAVENOUS | Status: AC | PRN
  Administered 2021-03-10: 20 mL via INTRAVENOUS

## 2021-03-12 ENCOUNTER — Telehealth: Payer: Self-pay

## 2021-03-12 DIAGNOSIS — G35 Multiple sclerosis: Secondary | ICD-10-CM

## 2021-03-12 DIAGNOSIS — R930 Abnormal findings on diagnostic imaging of skull and head, not elsewhere classified: Secondary | ICD-10-CM

## 2021-03-12 DIAGNOSIS — R937 Abnormal findings on diagnostic imaging of other parts of musculoskeletal system: Secondary | ICD-10-CM

## 2021-03-12 NOTE — Telephone Encounter (Signed)
Referral added

## 2021-03-12 NOTE — Telephone Encounter (Signed)
-----   Message from Drema Dallas, DO sent at 03/12/2021  9:06 AM EDT ----- Again, MRI of cervical spine shows pressing of the spinal cord in the neck but also a disc in the back is pressing on the spinal cord.  Refer again to neurosurgery.  He must see neurosurgery.

## 2021-03-17 ENCOUNTER — Other Ambulatory Visit: Payer: Self-pay

## 2021-03-17 MED FILL — Furosemide Tab 80 MG: ORAL | 30 days supply | Qty: 30 | Fill #0 | Status: AC

## 2021-03-17 MED FILL — Carvedilol Tab 12.5 MG: ORAL | 30 days supply | Qty: 60 | Fill #0 | Status: AC

## 2021-03-17 MED FILL — Losartan Potassium Tab 50 MG: ORAL | 30 days supply | Qty: 30 | Fill #0 | Status: AC

## 2021-03-17 MED FILL — Clonidine HCl Tab 0.1 MG: ORAL | 30 days supply | Qty: 60 | Fill #0 | Status: AC

## 2021-03-17 MED FILL — Metformin HCl Tab 500 MG: ORAL | 30 days supply | Qty: 60 | Fill #0 | Status: AC

## 2021-03-17 MED FILL — Empagliflozin Tab 10 MG: ORAL | 30 days supply | Qty: 30 | Fill #0 | Status: AC

## 2021-03-18 ENCOUNTER — Other Ambulatory Visit: Payer: Self-pay

## 2021-03-18 DIAGNOSIS — G959 Disease of spinal cord, unspecified: Secondary | ICD-10-CM | POA: Insufficient documentation

## 2021-03-18 DIAGNOSIS — Z6841 Body Mass Index (BMI) 40.0 and over, adult: Secondary | ICD-10-CM | POA: Insufficient documentation

## 2021-03-18 DIAGNOSIS — G35 Multiple sclerosis: Secondary | ICD-10-CM | POA: Insufficient documentation

## 2021-03-18 MED FILL — Spironolactone Tab 25 MG: ORAL | 30 days supply | Qty: 15 | Fill #0 | Status: CN

## 2021-03-26 ENCOUNTER — Ambulatory Visit (INDEPENDENT_AMBULATORY_CARE_PROVIDER_SITE_OTHER): Payer: 59 | Admitting: Internal Medicine

## 2021-03-26 ENCOUNTER — Telehealth: Payer: Self-pay

## 2021-03-26 ENCOUNTER — Other Ambulatory Visit: Payer: Self-pay

## 2021-03-26 ENCOUNTER — Encounter: Payer: Self-pay | Admitting: Internal Medicine

## 2021-03-26 ENCOUNTER — Other Ambulatory Visit: Payer: Self-pay | Admitting: Neurosurgery

## 2021-03-26 VITALS — BP 146/93 | HR 88 | Temp 99.5°F | Resp 20 | Wt 319.0 lb

## 2021-03-26 DIAGNOSIS — G959 Disease of spinal cord, unspecified: Secondary | ICD-10-CM

## 2021-03-26 DIAGNOSIS — R918 Other nonspecific abnormal finding of lung field: Secondary | ICD-10-CM

## 2021-03-26 DIAGNOSIS — R9389 Abnormal findings on diagnostic imaging of other specified body structures: Secondary | ICD-10-CM

## 2021-03-26 NOTE — Progress Notes (Signed)
MRI results/ follow uo

## 2021-03-26 NOTE — Progress Notes (Signed)
  Subjective:    Nicholas Kelly. - 55 y.o. male MRN [id number]  Date of birth: 1966/02/18  HPI  Nicholas Kelly. is here for follow up for abnormal finding on imaging. Had MRI of his thoracic spine performed on 5/16 for history of MS. At that time, lung abnormality was noted and was recommended to have CT chest to further characterize. Patient reports that when he had MRI performed he was in his normal health without cough or SOB. He quit smoking about 4 years ago.      Health Maintenance:  Health Maintenance Due  Topic Date Due   Zoster Vaccines- Shingrix (1 of 2) Never done   COLON CANCER SCREENING ANNUAL FOBT  07/16/2020    -  reports that he quit smoking about 4 years ago. His smoking use included cigarettes. He has a 34.00 pack-year smoking history. He has never used smokeless tobacco. - Review of Systems: Per HPI. - Past Medical History: Patient Active Problem List   Diagnosis Date Noted   DM (diabetes mellitus), type 2, uncontrolled (HCC) 03/28/2020   Hyperlipidemia 09/20/2017   Cerebral embolism with cerebral infarction 07/19/2017   Essential hypertension 07/17/2017   Vision disturbance 07/17/2017   Non-ischemic cardiomyopathy (HCC) 03/09/2017   Chronic combined systolic and diastolic CHF (congestive heart failure) (HCC) 02/10/2017   Hypertensive urgency 02/10/2017   Morbid obesity (HCC) 02/10/2017   Tobacco abuse 02/10/2017   Acute exacerbation of CHF (congestive heart failure) (HCC) 02/10/2017   - Medications: reviewed and updated   Objective:   Physical Exam BP (!) 146/93   Pulse 88   Temp 99.5 F (37.5 C)   Resp 20   Wt (!) 319 lb (144.7 kg)   SpO2 96%   BMI 48.50 kg/m  Physical Exam         Assessment & Plan:   1. Opacity of lung on imaging study 2. Abnormal MRI MRI read: Small right pleural effusion with rounded 1.1 cm opacity in the right lower lobe, suboptimally evaluated on this study. This may represent consolidation or  atelectasis, but pulmonary nodule is not excluded and CT of the chest is recommended to further characterize (particularly given the patient's smoking history). - CT Chest Wo Contrast; Future     Marcy Siren, D.O. 03/26/2021, 3:28 PM Primary Care at [facility]

## 2021-03-26 NOTE — Progress Notes (Signed)
Phone call to patient to verify medication list and allergies for myelogram procedure. Medications pt is currently taking are safe to continue to take. Advised pt if any new medications are started prior to procedure to call and make us aware. Pt also instructed to have a driver the day of the procedure and discharge instructions. Pt verbalized understanding. 

## 2021-04-02 ENCOUNTER — Ambulatory Visit
Admission: RE | Admit: 2021-04-02 | Discharge: 2021-04-02 | Disposition: A | Discharge status: Home / Self Care | Payer: 59 | Source: Ambulatory Visit | Attending: Neurosurgery | Admitting: Neurosurgery

## 2021-04-02 DIAGNOSIS — G959 Disease of spinal cord, unspecified: Secondary | ICD-10-CM

## 2021-04-02 MED ORDER — IOPAMIDOL (ISOVUE-M 300) INJECTION 61%
10.0000 mL | Freq: Once | INTRAMUSCULAR | Status: AC
  Administered 2021-04-02: 11:00:00 10 mL via INTRATHECAL

## 2021-04-02 MED ORDER — DIAZEPAM 5 MG PO TABS
10.0000 mg | ORAL_TABLET | Freq: Once | ORAL | Status: AC
  Administered 2021-04-02: 09:00:00 10 mg via ORAL

## 2021-04-02 NOTE — Discharge Instructions (Signed)
Myelogram Discharge Instructions  1. Go home and rest quietly as needed. You may resume normal activities; however, do not exert yourself strongly or do any heavy lifting today and tomorrow.   2. DO NOT drive today.    3. You may resume your normal diet and medications unless otherwise indicated. Drink lots of extra fluids today and tomorrow.   4. The incidence of headache, nausea, or vomiting is about 5% (one in 20 patients).  If you develop a headache, lie flat for 24 hours and drink plenty of fluids until the headache goes away.  Caffeinated beverages may be helpful. If when you get up you still have a headache when standing, go back to bed and force fluids for another 24 hours.   5. If you develop severe nausea and vomiting or a headache that does not go away with the flat bedrest after 48 hours, please call [phone].   6. Call your physician for a follow-up appointment.  The results of your myelogram will be sent directly to your physician by the following day.  7. If you have any questions or if complications develop after you arrive home, please call [phone].  Discharge instructions have been explained to the patient.  The patient, or the person responsible for the patient, fully understands these instructions.   Thank you for visiting our office today.  

## 2021-04-02 NOTE — Discharge Instr - Other Orders (Signed)
Pts blood pressure has been high on every reading since arriving to our office. Per pt, he took his BP medication this morning and he "feels fine". Pt also reports he checked his BP this morning and it was 120's over 80's. Pt states "it goes this high every time I go to the doctors office. I have white coat syndrome". Dr. Archer Asa was made aware of his BP.

## 2021-04-04 ENCOUNTER — Telehealth: Payer: Self-pay | Admitting: *Deleted

## 2021-04-04 NOTE — Telephone Encounter (Signed)
Order number for Prior Authorization- [id number]

## 2021-04-09 ENCOUNTER — Other Ambulatory Visit: Payer: Self-pay

## 2021-04-09 ENCOUNTER — Ambulatory Visit (HOSPITAL_COMMUNITY): Payer: 59 | Attending: Internal Medicine

## 2021-04-09 DIAGNOSIS — I5042 Chronic combined systolic (congestive) and diastolic (congestive) heart failure: Secondary | ICD-10-CM | POA: Insufficient documentation

## 2021-04-09 DIAGNOSIS — Z79899 Other long term (current) drug therapy: Secondary | ICD-10-CM | POA: Diagnosis present

## 2021-04-09 DIAGNOSIS — I428 Other cardiomyopathies: Secondary | ICD-10-CM | POA: Diagnosis not present

## 2021-04-09 DIAGNOSIS — I10 Essential (primary) hypertension: Secondary | ICD-10-CM | POA: Diagnosis present

## 2021-04-09 LAB — ECHOCARDIOGRAM COMPLETE: S' Lateral: 4.3 cm

## 2021-04-09 MED ORDER — PERFLUTREN LIPID MICROSPHERE
1.0000 mL | INTRAVENOUS | Status: AC | PRN
  Administered 2021-04-09: 3 mL via INTRAVENOUS

## 2021-04-14 ENCOUNTER — Other Ambulatory Visit: Payer: Self-pay

## 2021-04-14 ENCOUNTER — Ambulatory Visit
Admission: RE | Admit: 2021-04-14 | Discharge: 2021-04-14 | Disposition: A | Discharge status: Home / Self Care | Payer: 59 | Source: Ambulatory Visit | Attending: Internal Medicine | Admitting: Internal Medicine

## 2021-04-14 DIAGNOSIS — R9389 Abnormal findings on diagnostic imaging of other specified body structures: Secondary | ICD-10-CM

## 2021-04-14 DIAGNOSIS — R918 Other nonspecific abnormal finding of lung field: Secondary | ICD-10-CM

## 2021-04-14 NOTE — Progress Notes (Signed)
Previous opacity resolved.   Right pleural fluid resolved.   No lung nodules.   Continue cholesterol medication.

## 2021-04-18 ENCOUNTER — Other Ambulatory Visit: Payer: Self-pay

## 2021-04-18 MED FILL — Metformin HCl Tab 500 MG: ORAL | 30 days supply | Qty: 60 | Fill #1 | Status: AC

## 2021-04-18 MED FILL — Losartan Potassium Tab 50 MG: ORAL | 30 days supply | Qty: 30 | Fill #1 | Status: AC

## 2021-04-18 MED FILL — Furosemide Tab 80 MG: ORAL | 30 days supply | Qty: 30 | Fill #1 | Status: AC

## 2021-04-18 MED FILL — Carvedilol Tab 12.5 MG: ORAL | 30 days supply | Qty: 60 | Fill #1 | Status: AC

## 2021-04-18 MED FILL — Clonidine HCl Tab 0.1 MG: ORAL | 30 days supply | Qty: 60 | Fill #1 | Status: AC

## 2021-04-18 MED FILL — Empagliflozin Tab 10 MG: ORAL | 30 days supply | Qty: 30 | Fill #1 | Status: AC

## 2021-05-12 ENCOUNTER — Other Ambulatory Visit: Payer: Self-pay

## 2021-05-12 ENCOUNTER — Telehealth: Payer: Self-pay

## 2021-05-12 ENCOUNTER — Ambulatory Visit (INDEPENDENT_AMBULATORY_CARE_PROVIDER_SITE_OTHER): Payer: 59 | Admitting: Cardiovascular Disease

## 2021-05-12 ENCOUNTER — Encounter: Payer: Self-pay | Admitting: Cardiovascular Disease

## 2021-05-12 DIAGNOSIS — I428 Other cardiomyopathies: Secondary | ICD-10-CM

## 2021-05-12 DIAGNOSIS — I493 Ventricular premature depolarization: Secondary | ICD-10-CM | POA: Diagnosis not present

## 2021-05-12 DIAGNOSIS — I5042 Chronic combined systolic (congestive) and diastolic (congestive) heart failure: Secondary | ICD-10-CM | POA: Diagnosis not present

## 2021-05-12 DIAGNOSIS — I10 Essential (primary) hypertension: Secondary | ICD-10-CM | POA: Diagnosis not present

## 2021-05-12 DIAGNOSIS — G35 Multiple sclerosis: Secondary | ICD-10-CM

## 2021-05-12 DIAGNOSIS — E785 Hyperlipidemia, unspecified: Secondary | ICD-10-CM

## 2021-05-12 DIAGNOSIS — Z79899 Other long term (current) drug therapy: Secondary | ICD-10-CM

## 2021-05-12 MED ORDER — ENTRESTO 49-51 MG PO TABS
1.0000 | ORAL_TABLET | Freq: Two times a day (BID) | ORAL | 3 refills | Status: DC
  Filled 2021-05-12: qty 180, 90d supply, fill #0

## 2021-05-12 MED ORDER — CARVEDILOL 12.5 MG PO TABS: 25.0000 mg | ORAL_TABLET | Freq: Two times a day (BID) | ORAL | Status: DC

## 2021-05-12 NOTE — Patient Instructions (Addendum)
Medication Instructions:  STOP losartan  BEGINNING TOMORROW: Start Entresto 49-51mg  (1 tablet) twice daily.  Increase carvedilol - take 18.75 (1.5 of your 12.5mg  tablets) twice a day FOR 1 WEEK, then increase to 25mg  (2 of your 12.5mg  tablets) twice a day. When you finish the 12.5mg  tablets you have, please call our office for a refill.   *If you need a refill on your cardiac medications before your next appointment, please call your pharmacy*   Lab Work: Within 2 weeks, when FASTING - Cmet, ProBNP, CBC, TSH, Lipid, HbgA1C.   If you have labs (blood work) drawn today and your tests are completely normal, you will receive your results only by: MyChart Message (if you have MyChart) OR A paper copy in the mail If you have any lab test that is abnormal or we need to change your treatment, we will call you to review the results.   Testing/Procedures: None ordered.    Follow-Up: At [facility], you and your health needs are our priority.  As part of our continuing mission to provide you with exceptional heart care, we have created designated Provider Care Teams.  These Care Teams include your primary Cardiologist (physician) and Advanced Practice Providers (APPs -  Physician Assistants and Nurse Practitioners) who all work together to provide you with the care you need, when you need it.  We recommend signing up for the patient portal called "MyChart".  Sign up information is provided on this After Visit Summary.  MyChart is used to connect with patients for Virtual Visits (Telemedicine).  Patients are able to view lab/test results, encounter notes, upcoming appointments, etc.  Non-urgent messages can be sent to your provider as well.   To learn more about what you can do with MyChart, go to [facility].    Your next appointment:   Follow up with [email or web address], PharmD in 1-2 weeks, follow up with Dr. Belenda Cruise in 2-3 months.

## 2021-05-12 NOTE — Telephone Encounter (Signed)
Pt returned to office, retrieved samples for Entresto 49-51mg .

## 2021-05-12 NOTE — Progress Notes (Signed)
Cardiology Office Note    Date:  05/12/2021   ID:  Nicholas Kelly., DOB 11-29-1965, MRN [id number]  PCP:  Nicolette Bang, MD  Cardiologist:  [name], MD   Follow-up visit.  History of Present Illness:  Nicholas Kelly. is a 55 y.o. male who presents for a 4 month follow-up cardiology evaluation.  Nicholas Kelly has a history of hypertension, and presented to [facility] hospital on 02/10/2017 from urgent care with acute onset of shortness of breath.  He was previously diagnosed with CHF in October 17 and had been on HCTZ but had run out of this prescription.  He was admitted by the hospitalist service.  A chest x-ray showed findings of CHF with pulmonary edema.  BNP was elevated at 752.  An echo Doppler study showed reduced LV function with an EF of 20-25% with moderate concentric LVH, diffuse hypokinesis, grade 3 diastolic dysfunction, mild mitral regurgitation, moderate tricuspid regurgitation, and moderate bony hypertension with a PA pressure 51 mm.  Patient was seen in cardiology consultation.  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  He did not have significant obstructive CAD with only diffuse 30-50% mid and distal RCA stenoses.  There is moderate pulmonary hypertension.  He was discharged on 02/14/2017 and has been on lisinopril 5 mg, furosemide 40 mg, carvedilol 6.25 mg twice a day in addition to aspirin 81 mg, supplemental potassium 20, now quit once and atorvastatin 40 mg.    I saw for initial post hospital evaluation on 03/11/2017.  I reviewed his hospital data in its entirety.  He was feeling improved on his initial medical regimen and I recommended further titration of his lisinopril up to 10 mg and increase carvedilol to 12.5 mg twice a day.  He did not have insurance and ws working on trying to obtain a  Medicaid card.  We talked in the future about possibly switching him to entresto.  On his increase regimen, he has felt improved.    When I saw him in June 2018 his blood pressure continued to be elevated and I recommended the addition of spironolactone at 12.5 mg for one week and then to increase this to 12.5 mg twice a day.  I also titrated lisinopril from 10 mg to 15 mg. On July 17, 2017 he presented to the hospital with complaints of diplopia and visual disturbance. 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  An MRI of the range, did not show acute abnormality but revealed chronic ischemic microangiopathy.  He had a negative CTA, except for minimal calcified atherosclerosis involving only the carotids.  During his hospitalization.  A repeat echo Doppler study showed an EF of 30% with grade 1 diastolic dysfunction.  His aortic root was mildly dilated.  Neurology was consulted and he was felt to have had a likely CVA given his reduced LV function.  He underwent outpatient neurologic rehab.  When I saw him in October 2018 blood pressure was still elevated and I further titrated lisinopril to 20 mg.  He was also taking Spironolactone 12.5 mg twice a day, furosemide 40 mg daily, carvedilol 12.5 twice daily in addition to atorvastatin 80 mg and Plavix and aspirin.  When I saw him in April 2019 he had run out of his lisinopril  for the last 2 months.  His allergist had increase aspirin to 325 and he had continued to be on Plavix, carvedilol 12.5 twice a day, furosemide 40 mg and HCTZ 25 mg.  He had lost 35 pounds since October 2018.  He was no longer taking Spironolactone.  With his reduced EF I felt he was a good candidate for initiation of Entresto.  He was gradually been titrated up to maximum dose 97/103 and  saw [name] off [place] in May 2019 at which time the dose was titrated to maximum.  Apparently he took that dose for a  month since he had a 30-day free samples but unfortunately when he brought the prescription to [facility] they were unable to fill it since he had no insurance.  When I last saw him on July 26, 2018 he was restarted with samples of and on Entresto at 49/51 mg.  At the time he also was on carvedilol 12.5 mg twice a day, furosemide 60 mg daily without edema.  He was on atorvastatin 80 mg for hyperlipidemia.    I last saw him in January 2020 and over the past several months prior to that evaluation he  felt improved with reinitiation of Entresto therapy.  At times he still noted some abdominal bloating.  He was  on carvedilol 12.5 twice a day, clonidine 0.1 mg twice a day, furosemide 60 mg daily and apparently HCTZ 25 mg.  During that evaluation, he had no signs of edema and appears euvolemic on exam.  So that I could further titrate Entresto back to maximum dosing, I suggested he discontinue HCTZ and also suggested he decrease Lasix.  For several weeks I also recommended slight additional titration of carvedilol.  At his last office visit with me in August 2020 he was no longer taking Entresto since his samples had run out over 3 months previously.  As result he noticed recurrent symptomatology with shortness of breath.  During that evaluation, I provided him with samples of Entresto 24/26 for him to take twice a day for 2 weeks and gave him another prescription for 49/51 for titration.  He was scheduled to see his pharmacist for follow-up evaluation.  I last saw him on January 06, 2021 and prior to that evaluation had not seen him since  August 2020 but he had been evaluated by [name] subsequent  to my evaluation.  At his March 2022 evaluation he was no longer on Entresto and has been on carvedilol 12.5 mg twice a day, furosemide 80 mg daily, losartan 50 mg daily.  He apparently has been taking supplemental potassium, clonidine 0.1 mg twice a day and continues to be on atorvastatin 80 mg for  hyperlipidemia and Metformin 500 mg twice a day for diabetes mellitus.  He is followed by Dr. Tomi Likens for his multiple sclerosis.  Recent LDL level was 104.  During that evaluation, his blood pressure was elevated.  I recommended he discontinue supplemental potassium and initiated spironolactone 12.5 mg daily.  He continued to be on Jardiance 10 mg and was tolerating this well.  Recommended that he have a follow-up echo Doppler study for reassessment of LV function.  Nicholas Kelly underwent a follow-up echo Doppler study on April 09, 2021.  EF was improved now in the low normal range at 50 to 55% with frequent PVCs noted throughout the study.  There was mild LVH.  He had hyperdynamic RV function.  Compared to his prior study in 2018 he has had  increased from 20 to 25%.  Presently, Nicholas Kelly to frequent urination and he currently is taking furosemide 80 mg daily, Jardiance 10 mg, per lactone 12.5 mg daily, losartan 50 mg, in addition to clonidine 0.1 mg twice a day and carvedilol 12.5 mg twice a day.  He continues to be on atorvastatin 80 mg.  He felt better on Entresto.  He states he now has insurance.  He admits to weight gain.  He presents for evaluation.  Past Medical History:  Diagnosis Date   Acute CHF (congestive heart failure) ([place]) 02/10/2017   Dyspnea    Hypertension    Stroke [facility])     Past Surgical History:  Procedure Laterality Date   NO PAST SURGERIES     RIGHT/LEFT HEART CATH AND CORONARY ANGIOGRAPHY N/A 02/12/2017   Procedure: Right/Left Heart Cath and Coronary Angiography;  Surgeon: Belva Crome, MD;  Location: [facility];  Service: Cardiovascular;  Laterality: N/A;    Current Medications: Outpatient Medications Prior to Visit  Medication Sig Dispense Refill   aspirin 81 MG tablet Take 1 tablet (81 mg total) by mouth daily. 30 tablet 3   atorvastatin (LIPITOR) 80 MG tablet Take 1 tablet (80 mg total) by mouth daily at 6 PM. 30 tablet 10   Blood Glucose Monitoring  Suppl (TRUE METRIX METER) w/Device KIT Use as instructed to check blood sugar BID. 1 kit 0   cloNIDine (CATAPRES) 0.1 MG tablet TAKE 1 TABLET BY MOUTH TWICE DAILY. 480 tablet 0   diazepam (VALIUM) 5 MG tablet Take 30 to 40 minutes prior to MRI.  Must have a driver to and from MRI. 2 tablet 0   empagliflozin (JARDIANCE) 10 MG TABS tablet Take 1 tablet (10 mg total) by mouth daily before breakfast. 30 tablet 2   empagliflozin (JARDIANCE) 10 MG TABS tablet TAKE 1 TABLET BY MOUTH ONCE DAILY BEFORE BREAKFAST 90 tablet 0   furosemide (LASIX) 80 MG tablet Take 1 tablet (80 mg total) by mouth daily. 30 tablet 5   furosemide (LASIX) 80 MG tablet TAKE 1 TABLET BY MOUTH DAILY. 90 tablet 0   glucose blood test strip Use as instructed to check blood sugar twice daily. 100 each 6   glucose blood test strip USE AS DIRECTED TWICE DAILY. 300 strip 0   metFORMIN (GLUCOPHAGE) 500 MG tablet TAKE 2 TABLETS BY MOUTH TWICE DAILY WITH A MEAL. 240 tablet 0   potassium chloride SA (KLOR-CON) 20 MEQ tablet TAKE 1 TABLET BY MOUTH DAILY. 240 tablet 0   spironolactone (ALDACTONE) 25 MG tablet TAKE 1/2 TABLET BY MOUTH ONCE DAILY. 135 tablet 0   TRUEplus Lancets 28G MISC Use to check blood sugar twice daily. 100 each 6   TRUEplus Lancets 28G MISC USE AS DIRECTED TO CHECK GLUCOSE TWICE DAILY. 400 each 0   carvedilol (COREG) 12.5 MG tablet TAKE 1 TABLET BY MOUTH TWICE DAILY WITH A MEAL. 480 tablet 0   losartan (COZAAR) 50 MG tablet TAKE 1 TABLET BY MOUTH DAILY. 150 tablet 0   No facility-administered medications prior to visit.     Allergies:   Patient has no known allergies.   Social History   Socioeconomic History   Marital status: Divorced    Spouse name: Not on file   Number of children: 4   Years of education: Not on file   Highest education level: High school graduate  Occupational History   Not on file  Tobacco Use   Smoking status: Former  Packs/day: 1.00    Years: 34.00    Pack years: 34.00    Types:  Cigarettes    Quit date: 06/28/2016    Years since quitting: 4.8   Smokeless tobacco: Never  Vaping Use   Vaping Use: Never used  Substance and Sexual Activity   Alcohol use: Not Currently    Comment: stopped in 2018   Drug use: Yes    Comment: 02/10/2017 "experimented w/drugs; haven't done any for years"   Sexual activity: Not Currently  Other Topics Concern   Not on file  Social History Narrative   Lives in one story home with daughter; Left handed; some college; caffeine - occasional coffee not daily; walks 2xweek   Social Determinants of Health   Financial Resource Strain: Not on file  Food Insecurity: Not on file  Transportation Needs: Not on file  Physical Activity: Not on file  Stress: Not on file  Social Connections: Not on file     Family History:  The patient's family history includes Cardiomyopathy in his mother; Hypertension in his mother; Multiple sclerosis in his sister; Stroke in his maternal aunt.   ROS General: Negative; No fevers, chills, or night sweats;  HEENT: positive for recent visual disturbance. sinus congestion, difficulty swallowing Pulmonary: Negative; No cough, wheezing, shortness of breath, hemoptysis Cardiovascular: See HPI GI: Negative; No nausea, vomiting, diarrhea, or abdominal pain GU: Negative; No dysuria, hematuria, or difficulty voiding Musculoskeletal: Negative; no myalgias, joint pain, or weakness Hematologic/Oncology: Negative; no easy bruising, bleeding Endocrine: Negative; no heat/cold intolerance; no diabetes Neuro: positive for previous diplopia Skin: Negative; No rashes or skin lesions Psychiatric: Negative; No behavioral problems, depression Sleep: Negative; No snoring, daytime sleepiness, hypersomnolence, bruxism, restless legs, hypnogognic hallucinations, no cataplexy Other comprehensive 14 point system review is negative.   PHYSICAL EXAM:   VS:  BP (!) 150/100 (BP Location: Left Arm)   Pulse 80   Ht '5\' 8"'  (1.727 m)   Wt  (!) 335 lb 6.4 oz (152.1 kg)   BMI 51.00 kg/m     Repeat blood pressure by me was 126/78.  Wt Readings from Last 3 Encounters:  05/12/21 (!) 335 lb 6.4 oz (152.1 kg)  03/26/21 (!) 319 lb (144.7 kg)  01/06/21 (!) 315 lb 3.2 oz (143 kg)    General: Alert, oriented, no distress.  Super morbid obesity Skin: normal turgor, no rashes, warm and dry HEENT: Normocephalic, atraumatic. Pupils equal round and reactive to light; sclera anicteric; extraocular muscles intact;  Nose without nasal septal hypertrophy Mouth/Parynx benign; Mallinpatti scale 3 Neck: No JVD, no carotid bruits; normal carotid upstroke Lungs: clear to ausculatation and percussion; no wheezing or rales Chest wall: without tenderness to palpitation Heart: PMI not displaced, predominant regular rhythm with occasional ectopic complex s1 s2 normal, 1/6 systolic murmur, no diastolic murmur, no rubs, gallops, thrills, or heaves Abdomen: Significant central adiposity; soft, nontender; no hepatosplenomehaly, BS+; abdominal aorta nontender and not dilated by palpation. Back: no CVA tenderness Pulses 2+ Musculoskeletal: full range of motion, normal strength, no joint deformities Extremities: Mild residual leg swelling; no clubbing cyanosis, Homan's sign negative  Neurologic: grossly nonfocal; Cranial nerves grossly wnl Psychologic: Normal mood and affect   Studies/Labs Reviewed:   May 12, 2021 ECG (independently read by me): Normal sinus rhythm at 80 bpm, sinus arrhythmia, PVC, Right branch block, left ventricular hypertrophy and left axis deviation. A repeat ECG was also performed which showed sinus rhythm at 84 with frequent PVCs, possible left atrial enlargement, left axis deviation,  right bundle branch block, and LVH.  January 06, 2021 ECG (independently read by me): Sinus rhythm at 87, PVCs, RBBB, LAHB, LVH  June 15, 2019 ECG (independently read by me): NSR at 64; RBBB  November 22, 2018 ECG (independently read by me):  Sinus rhythm with occasional PACs as well as PVC.  Left axis deviation.  Right bundle branch block.  LVH by voltage.  July 26, 2018 ECG (independently read by me): Sinus bradycardia at 53 bpm.  Right bundle branch block. LVH by voltage; normal intervals  February 20, 2018 ECG (independently read by me): Normal sinus rhythm at 74 bpm.  Left axis deviation.  Right bundle branch block.  LVH by voltage criteria.  QTc interval 492 ms.  August 18 2017 ECG (independently read by me): Normal sinus rhythm at 66 bpm.  Right bundle branch block with repolarization changes.  Inferolateral T change.  T-wave changes.  LVH.  QTc interval 461 ms.  04/21/2017 EKG:  EKG is ordered today. Normal sinus rhythm at 77 bpm.  LVH with repolarization changes.  PR interval 184 ms.  QTc interval 488 ms.  03/11/2017 ECG (independently read by me): Normal sinus rhythm at 73 bpm.  Left axis deviation.  LVH by voltage criteria with repolarization changes.  Recent Labs: BMP Latest Ref Rng & Units 06/27/2020 03/27/2020 03/18/2020  Glucose 65 - 99 mg/dL 108(H) 392(H) 539(HH)  BUN 6 - 24 mg/dL '17 18 18  ' Creatinine 0.76 - 1.27 mg/dL 1.23 1.04 1.32(H)  BUN/Creat Ratio 9 - '20 14 17 14  ' Sodium 134 - 144 mmol/L 143 136 136  Potassium 3.5 - 5.2 mmol/L 4.7 4.7 4.8  Chloride 96 - 106 mmol/L 104 99 96  CO2 20 - 29 mmol/L '27 24 20  ' Calcium 8.7 - 10.2 mg/dL 9.9 9.2 9.8     Hepatic Function Latest Ref Rng & Units 11/09/2019 09/29/2019 06/15/2019  Total Protein 6.1 - 8.1 g/dL 7.5 - 7.5  Albumin 3.5 - 5.2 g/dL - 3.4(L) 3.6  AST 10 - 35 U/L 18 - 20  ALT 9 - 46 U/L 20 - 18  Alk Phosphatase 38 - 126 U/L - - 61  Total Bilirubin 0.2 - 1.2 mg/dL 0.7 - 0.7  Bilirubin, Direct 0.1 - 0.5 mg/dL - - -    CBC Latest Ref Rng & Units 08/22/2020 10/11/2019 06/15/2019  WBC 3.4 - 10.8 x10E3/uL 9.8 7.8 -  Hemoglobin 13.0 - 17.7 g/dL 12.6(L) 13.1 14.3  Hematocrit 37.5 - 51.0 % 41.2 38.9 42.0  Platelets 150 - 450 x10E3/uL 343 - -   Lab Results   Component Value Date   MCV 97 08/22/2020   MCV 96 10/11/2019   MCV 100.7 (H) 06/15/2019   Lab Results  Component Value Date   TSH 2.520 09/20/2018   Lab Results  Component Value Date   HGBA1C 5.5 11/21/2020     BNP    Component Value Date/Time   BNP 7.2 12/18/2019 1556   BNP 752.4 (H) 02/10/2017 1525    ProBNP    Component Value Date/Time   PROBNP 123 (H) 07/06/2019 1432     Lipid Panel     Component Value Date/Time   CHOL 158 11/21/2020 1127   TRIG 74 11/21/2020 1127   HDL 40 11/21/2020 1127   CHOLHDL 4.0 11/21/2020 1127   CHOLHDL 4.9 07/18/2017 0558   VLDL 11 07/18/2017 0558   LDLCALC 104 (H) 11/21/2020 1127     RADIOLOGY: CT Chest Wo Contrast  Result Date: 04/14/2021  CLINICAL DATA:  1.1 cm rounded opacity in the right lower lobe and small right pleural effusion on a recent thoracic spine MRI. EXAM: CT CHEST WITHOUT CONTRAST TECHNIQUE: Multidetector CT imaging of the chest was performed following the standard protocol without IV contrast. COMPARISON:  Thoracic spine MRI dated 03/10/2021. Chest radiographs dated 02/11/2019 a FINDINGS: Cardiovascular: Atheromatous calcifications, including the coronary arteries and aorta. Borderline enlarged heart. Mediastinum/Nodes: No enlarged mediastinal or axillary lymph nodes. Thyroid gland, trachea, and esophagus demonstrate no significant findings. Lungs/Pleura: The previously demonstrated 1.1 cm rounded opacity in the posterior right lower lobe is no longer present. There is minimal dependent patchy atelectasis or scarring in the posteromedial right lower lobe today. There are minimal similar changes in the anterolateral lingula and dependent portion of the right upper lobe. No lung nodules are seen today. No pleural fluid. Upper Abdomen: Unremarkable. Musculoskeletal: Thoracic and lower cervical spine degenerative changes. IMPRESSION: 1. The previously demonstrated 1.1 cm rounded opacity in the right lower lobe is no longer  present. This most likely represented rounded atelectasis or rounded pneumonia. 2. No lung nodules or acute abnormality seen today. 3. Resolved right pleural fluid. 4.  Calcific coronary artery and aortic atherosclerosis. Aortic Atherosclerosis (ICD10-I70.0). Electronically Signed   By: Claudie Revering M.D.   On: 04/14/2021 13:53     Additional studies/ records that were reviewed today include:  I reviewed the patient's recent hospitalization, his cardiac consultation, cardiac catheterization report, and hospitalist records. I reviewed his hospitalization from September 22 through 07/19/2017 including imaging studies and echocardiogram.  ECHO 04/09/2021 IMPRESSIONS   1. Heart rate varied during the study - possible atrial arrythmias and  frequent PVC's were noted, also making LVEF determination difficult. Left  ventricular ejection fraction, by estimation, is 50 to 55%. The left  ventricle has low normal function. The  left ventricle has no regional wall motion abnormalities. There is mild  left ventricular hypertrophy. Indeterminate diastolic filling due to E-A  fusion.   2. Right ventricular systolic function is hyperdynamic. The right  ventricular size is normal.   3. The mitral valve is grossly normal. Trivial mitral valve  regurgitation.   4. The aortic valve is tricuspid. Aortic valve regurgitation is not  visualized.   Comparison(s): Prior images unable to be directly viewed, comparison made  by report only. Changes from prior study are noted. 07/18/2017: LVEF  20-25%.   ASSESSMENT:    1. Non-ischemic cardiomyopathy ([place])   2. Chronic combined systolic and diastolic CHF (congestive heart failure) ([place])   3. Essential hypertension   4. Frequent PVCs   5. Hyperlipidemia, unspecified hyperlipidemia type   6. Morbid obesity ([place])   7. Medication management   8. Multiple sclerosis [facility])     PLAN:  Nicholas Kelly is a 55 year-old African-American gentleman who has a history of  morbid obesity and has documented reduced EF noted in April 2018.  He has been an excellent candidate for [facility] and previously had been uptitrated to 97/103 twice daily with significant improvement but unfortunately had stopped therapy when he could not get additional medication.  He  was able to be restarted back on Entresto and  was titrated to 49/51 and later 97/103 mm successfully.  Fortunately, again he stopped using Entresto when his samples ran out which he believes may have been sometime in March or April 2020.  When I saw him in August 2020 I again reinstituted therapy.  When most recently seen in March 2022, he was no longer taking  Entresto unfortunately due to cost issues.  He tells me at the time he did not have insurance.  Over the past several months, there has been a 20 pound weight gain from 315 pounds up to 335 pounds today.  He states that he is frequently urinating sometimes every 15 minutes throughout the day on his current regimen of furosemide 80 mg daily, spironolactone 12.5 mg and Jardiance 10 mg.  He continues to be on losartan at 50 mg daily.  He tells me he now has insurance with [employer].  As result, I have recommended reinstitution of Entresto therapy.  I have recommended he discontinue losartan.  Commencing tomorrow morning he he will initiate Entresto 49/51 mg twice a day which I provided him a 2-week sample supply.  With his heart rate in the 80s and frequent PVCs I have recommended further titration of carvedilol from 12.5 mg twice a day to 18.75 mg twice a day for the next week and then he will further increase to 25 mg twice daily.  He continues to be on atorvastatin 80 mg daily for hyperlipidemia.  In 1 to 2 weeks I have recommended he undergo complete set of fasting laboratory consisting of a comprehensive metabolic panel, pro BNP, CBC, TSH, lipid studies as well as a hemoglobin A1c.  I have arranged a follow-up evaluation for him to see Joslyn Hy our pharmacist.   We discussed sodium restriction.  He is not having any chest pain.  His most recent echo Doppler study has shown improvement in LV function.  I will see him in 2 to 3 months for reevaluation.    Medication Adjustments/Labs and Tests Ordered: Current medicines are reviewed at length with the patient today.  Concerns regarding medicines are outlined above.  Medication changes, Labs and Tests ordered today are listed in the Patientt Instructions below. Patient Instructions  Medication Instructions:  STOP losartan  BEGINNING TOMORROW: Start Entresto 49-25m (1 tablet) twice daily.  Increase carvedilol - take 18.75 (1.5 of your 12.552mtablets) twice a day FOR 1 WEEK, then increase to 2565m2 of your 12.5mg35mblets) twice a day. When you finish the 12.5mg 93mlets you have, please call our office for a refill.   *If you need a refill on your cardiac medications before your next appointment, please call your pharmacy*   Lab Work: Within 2 weeks, when FASTING - Cmet, ProBNP, CBC, TSH, Lipid, HbgA1C.   If you have labs (blood work) drawn today and your tests are completely normal, you will receive your results only by: [place] have MyChart) OR A paper copy in the mail If you have any lab test that is abnormal or we need to change your treatment, we will call you to review the results.   Testing/Procedures: None ordered.    Follow-Up: At [facility] and your health needs are our priority.  As part of our continuing mission to provide you with exceptional heart care, we have created designated Provider Care Teams.  These Care Teams include your primary Cardiologist (physician) and Advanced Practice Providers (APPs -  Physician Assistants and Nurse Practitioners) who all work together to provide you with the care you need, when you need it.  We recommend signing up for the patient portal called "MyChart".  Sign up information is provided on this After Visit Summary.   MyChart is used to connect with patients for Virtual Visits (Telemedicine).  Patients are able to view lab/test results, encounter notes, upcoming appointments, etc.  Non-urgent messages can be sent to your provider as well.   To learn more about what you can do with MyChart, go to [email or web address].    Your next appointment:   Follow up with Erasmo Downer, PharmD in 1-2 weeks, follow up with Dr. Claiborne Billings in 2-3 months.     Signed, [name], MD , [facility] 05/12/2021 1:15 PM    [place] [address], [place], [place], [place]  [address] Phone: [phone]

## 2021-05-12 NOTE — Telephone Encounter (Signed)
Patient had left his Entresto samples at office. Pt called patient to let him know to return to office for samples. Left voicemail for patient to return office's call.

## 2021-05-14 ENCOUNTER — Ambulatory Visit: Payer: 59 | Admitting: Neurology

## 2021-05-14 ENCOUNTER — Telehealth: Payer: Self-pay

## 2021-05-14 NOTE — Telephone Encounter (Signed)
Ocrevus needs to know whether he still has [employer] insurance or new insurance, patient to call back.

## 2021-05-15 ENCOUNTER — Other Ambulatory Visit: Payer: Self-pay

## 2021-05-15 ENCOUNTER — Telehealth: Payer: Self-pay | Admitting: Neurology

## 2021-05-15 MED FILL — Clonidine HCl Tab 0.1 MG: ORAL | 30 days supply | Qty: 60 | Fill #2 | Status: CN

## 2021-05-15 NOTE — Telephone Encounter (Signed)
Noted. Neysa Bonito is aware.

## 2021-05-15 NOTE — Telephone Encounter (Signed)
Pt stated he was calling to  let christy know he still has [employer] insurance. Said everything is the same

## 2021-05-20 LAB — LIPID PANEL
Chol/HDL Ratio: 5.7 ratio — ABNORMAL HIGH (ref 0.0–5.0)
Cholesterol, Total: 205 mg/dL — ABNORMAL HIGH (ref 100–199)
HDL: 36 mg/dL — ABNORMAL LOW (ref >39)
LDL Chol Calc (NIH): 147 mg/dL — ABNORMAL HIGH (ref 0–99)
Triglycerides: 121 mg/dL (ref 0–149)
VLDL Cholesterol Cal: 22 mg/dL (ref 5–40)

## 2021-05-20 LAB — COMPREHENSIVE METABOLIC PANEL
ALT: 17 IU/L (ref 0–44)
AST: 13 IU/L (ref 0–40)
Albumin/Globulin Ratio: 1.2 (ref 1.2–2.2)
Albumin: 4.2 g/dL (ref 3.8–4.9)
Alkaline Phosphatase: 70 IU/L (ref 44–121)
BUN/Creatinine Ratio: 19 (ref 9–20)
BUN: 19 mg/dL (ref 6–24)
Bilirubin Total: 0.8 mg/dL (ref 0.0–1.2)
CO2: 22 mmol/L (ref 20–29)
Calcium: 9.9 mg/dL (ref 8.7–10.2)
Chloride: 102 mmol/L (ref 96–106)
Creatinine, Ser: 1.01 mg/dL (ref 0.76–1.27)
Globulin, Total: 3.5 g/dL (ref 1.5–4.5)
Glucose: 123 mg/dL — ABNORMAL HIGH (ref 65–99)
Potassium: 4.7 mmol/L (ref 3.5–5.2)
Sodium: 142 mmol/L (ref 134–144)
Total Protein: 7.7 g/dL (ref 6.0–8.5)
eGFR: 87 mL/min/{1.73_m2} (ref >59)

## 2021-05-20 LAB — CBC
Hematocrit: 40.3 % (ref 37.5–51.0)
Hemoglobin: 13.2 g/dL (ref 13.0–17.7)
MCH: 30.4 pg (ref 26.6–33.0)
MCHC: 32.8 g/dL (ref 31.5–35.7)
MCV: 93 fL (ref 79–97)
Platelets: 300 10*3/uL (ref 150–450)
RBC: 4.34 x10E6/uL (ref 4.14–5.80)
RDW: 13.1 % (ref 11.6–15.4)
WBC: 9.3 10*3/uL (ref 3.4–10.8)

## 2021-05-20 LAB — HEMOGLOBIN A1C
Est. average glucose Bld gHb Est-mCnc: 151 mg/dL
Hgb A1c MFr Bld: 6.9 % — ABNORMAL HIGH (ref 4.8–5.6)

## 2021-05-20 LAB — PRO B NATRIURETIC PEPTIDE: NT-Pro BNP: 15 pg/mL (ref 0–210)

## 2021-05-20 LAB — TSH: TSH: 3.14 u[IU]/mL (ref 0.450–4.500)

## 2021-05-28 ENCOUNTER — Ambulatory Visit (INDEPENDENT_AMBULATORY_CARE_PROVIDER_SITE_OTHER): Payer: Self-pay | Admitting: Pharmacist

## 2021-05-28 ENCOUNTER — Other Ambulatory Visit: Payer: Self-pay

## 2021-05-28 VITALS — BP 158/80 | HR 80 | Wt 335.6 lb

## 2021-05-28 DIAGNOSIS — E1165 Type 2 diabetes mellitus with hyperglycemia: Secondary | ICD-10-CM

## 2021-05-28 DIAGNOSIS — E785 Hyperlipidemia, unspecified: Secondary | ICD-10-CM

## 2021-05-28 DIAGNOSIS — I5042 Chronic combined systolic (congestive) and diastolic (congestive) heart failure: Secondary | ICD-10-CM

## 2021-05-28 MED ORDER — EZETIMIBE 10 MG PO TABS
10.0000 mg | ORAL_TABLET | Freq: Every day | ORAL | 1 refills | Status: DC
  Filled 2021-05-28: qty 30, 30d supply, fill #0
  Filled 2021-07-03: qty 30, 30d supply, fill #1
  Filled 2021-08-26: qty 30, 30d supply, fill #2
  Filled 2021-10-24: qty 30, 30d supply, fill #3
  Filled 2021-11-27: qty 30, 30d supply, fill #0
  Filled 2021-12-26: qty 30, 30d supply, fill #1

## 2021-05-28 MED ORDER — SACUBITRIL-VALSARTAN 97-103 MG PO TABS
1.0000 | ORAL_TABLET | Freq: Two times a day (BID) | ORAL | 1 refills | Status: DC
  Filled 2021-05-28: qty 60, 30d supply, fill #0
  Filled 2021-07-03: qty 60, 30d supply, fill #1
  Filled 2021-08-26 – 2021-10-24 (×2): qty 60, 30d supply, fill #2

## 2021-05-28 MED ORDER — ATORVASTATIN CALCIUM 80 MG PO TABS
80.0000 mg | ORAL_TABLET | Freq: Every day | ORAL | 1 refills | Status: DC
  Filled 2021-05-28: qty 30, 30d supply, fill #0
  Filled 2021-07-03: qty 30, 30d supply, fill #1
  Filled 2021-08-26: qty 30, 30d supply, fill #2
  Filled 2021-10-24: qty 30, 30d supply, fill #3
  Filled 2021-12-26: qty 30, 30d supply, fill #0
  Filled 2022-03-02: qty 30, 30d supply, fill #1

## 2021-05-28 MED ORDER — METFORMIN HCL 1000 MG PO TABS
1000.0000 mg | ORAL_TABLET | Freq: Two times a day (BID) | ORAL | 1 refills | Status: DC
  Filled 2021-05-28: qty 60, 30d supply, fill #0
  Filled 2021-07-03: qty 60, 30d supply, fill #1
  Filled 2021-08-26: qty 60, 30d supply, fill #2
  Filled 2021-10-24: qty 60, 30d supply, fill #3
  Filled 2021-12-26: qty 60, 30d supply, fill #0
  Filled 2022-03-02: qty 60, 30d supply, fill #1

## 2021-05-28 NOTE — Patient Instructions (Addendum)
It was nice meeting you today!  We would like your blood pressure to be less than 130/80 We would like your LDL (bad cholesterol) to be less than 70  Continue your spironolactone 25 mg 1/2 tablet once a day Continue your Jardiance 10mg  once a day Continue your furosemide 80mg  once a day Continue your carvedilol 12.5mg  (2 tablets) twice a day We will increase your Entresto to 97-103 mg twice a day  I refilled your atorvastatin and metformin for you  We will start a new medication called ezetimibe 10mg  for your cholesterol once a day  Watch your salt intake and try to find your scale so you can start weighing yourself  , PharmD, BCACP, CDCES, CPP [facility] [address], [place], [facility] [address] Phone: [phone]; Fax: [phone] 05/28/2021 2:49 PM

## 2021-05-28 NOTE — Progress Notes (Signed)
Patient ID: Nicholas Kelly.                 DOB: 1965-12-20                      MRN: [id number]     HPI: Nicholas Kelly. is a 55 y.o. male referred by Dr. Claiborne Billings to pharmacy clinic for HF medication management. PMH is significant for CHF, HTN, stroke, DM, obesity and multiple sclerosis. Most recent LVEF 50-55% on 04/09/21.  Today he returns to pharmacy clinic for further medication titration. At last visit with Dr Claiborne Billings losartan was discontinued, carvedilol was increased, and Entresto was restarted.  Patient has a long history of not refilling maintenance medications or being unable to afford them.  He is now enrolled in [facility].  Reports he has run out of metformin and atorvastatin.  Symptomatically, he is feeling better.  He denies  dizziness, lightheadedness and fatigue chest pain or palpitations. Able to complete all ADLs. He has not been checking his weight or blood pressure at home. Has swelling in lower extremities.   Is a former truck Geophysicist/field seismologist. Currently is physically inactive but plans on starting to go to [employer]. Is trying to reduce salts and saturated fats in his diet.   Dr Claiborne Billings recommended addition of Zetia to patients treatment regimen after last lab work but patient had not received message.  Current CHF meds: Entresto 49-51 BID, carvedilol 52m BID, spironolactone 12.584mdaily, Jardiance 1048maily, furosemide 86m55mily  BP goal: <130/80  Wt Readings from Last 3 Encounters:  05/12/21 (!) 335 lb 6.4 oz (152.1 kg)  03/26/21 (!) 319 lb (144.7 kg)  01/06/21 (!) 315 lb 3.2 oz (143 kg)   BP Readings from Last 3 Encounters:  05/12/21 (!) 150/100  04/02/21 (!) 185/111  03/26/21 (!) 146/93   Pulse Readings from Last 3 Encounters:  05/12/21 80  04/02/21 83  03/26/21 88    Renal function: CrCl cannot be calculated (Unknown ideal weight.).  Past Medical History:  Diagnosis Date   Acute CHF (congestive heart failure) (HCC)[place]/18/2018   Dyspnea     Hypertension    [facility]  Current Outpatient Medications on File Prior to Visit  Medication Sig Dispense Refill   aspirin 81 MG tablet Take 1 tablet (81 mg total) by mouth daily. 30 tablet 3   atorvastatin (LIPITOR) 80 MG tablet Take 1 tablet (80 mg total) by mouth daily at 6 PM. 30 tablet 10   Blood Glucose Monitoring Suppl (TRUE METRIX METER) w/Device KIT Use as instructed to check blood sugar BID. 1 kit 0   carvedilol (COREG) 12.5 MG tablet Take 2 tablets (25 mg total) by mouth 2 (two) times daily with a meal.     cloNIDine (CATAPRES) 0.1 MG tablet TAKE 1 TABLET BY MOUTH TWICE DAILY. 480 tablet 0   diazepam (VALIUM) 5 MG tablet Take 30 to 40 minutes prior to MRI.  Must have a driver to and from MRI. 2 tablet 0   empagliflozin (JARDIANCE) 10 MG TABS tablet Take 1 tablet (10 mg total) by mouth daily before breakfast. 30 tablet 2   empagliflozin (JARDIANCE) 10 MG TABS tablet TAKE 1 TABLET BY MOUTH ONCE DAILY BEFORE BREAKFAST 90 tablet 0   furosemide (LASIX) 80 MG tablet Take 1 tablet (80 mg total) by mouth daily. 30 tablet 5   furosemide (LASIX) 80 MG tablet TAKE 1 TABLET BY MOUTH DAILY. 90[place]  tablet 0   glucose blood test strip Use as instructed to check blood sugar twice daily. 100 each 6   glucose blood test strip USE AS DIRECTED TWICE DAILY. 300 strip 0   metFORMIN (GLUCOPHAGE) 500 MG tablet TAKE 2 TABLETS BY MOUTH TWICE DAILY WITH A MEAL. 240 tablet 0   potassium chloride SA (KLOR-CON) 20 MEQ tablet TAKE 1 TABLET BY MOUTH DAILY. 240 tablet 0   sacubitril-valsartan (ENTRESTO) 49-51 MG Take 1 tablet by mouth 2 (two) times daily. 180 tablet 3   spironolactone (ALDACTONE) 25 MG tablet TAKE 1/2 TABLET BY MOUTH ONCE DAILY. 135 tablet 0   TRUEplus Lancets 28G MISC Use to check blood sugar twice daily. 100 each 6   No current facility-administered medications on file prior to visit.    No Known Allergies   Assessment/Plan:  1. CHF -  Patient BP in room 158/80 which is above goal of  <130/80.  Counseled patient to continue to minimize salt and carbohydrates to help control blood pressure, blood sugar, and cholesterol.    Is tolerating all medications well.  Will increase Entresto at this time to 97-103 BID and hopefully be able to taper down his furosemide dose.  Recommended he begin checking his weight and blood pressure at home.    Will refill atorvastatin and metformin at this time since patient is at high risk for metabolic syndrome.  Will add Zetia as previously recommended.  Continue spironolactone 12.69m daily Continue Jardiance 117mdaily Continue furosemide 8055maily Continue carvedilol 36m49mD Increase Entresto 97-103mg44m Recheck in 4-6 weeks  ChrisKarren CobblermD, BCACP, CDCESRenovo COn[place] [id number][id number]: [phone]: [phone] 5:31 PM

## 2021-05-29 ENCOUNTER — Other Ambulatory Visit: Payer: Self-pay

## 2021-06-05 ENCOUNTER — Telehealth: Payer: Self-pay | Admitting: Cardiovascular Disease

## 2021-06-05 NOTE — Telephone Encounter (Signed)
Left a message for the patient to call back. Looks like it may have been addressed at an appointment with pharmd on 05/28/21

## 2021-06-05 NOTE — Telephone Encounter (Signed)
Patient returning call to go over lab results 

## 2021-06-09 NOTE — Telephone Encounter (Signed)
Called patient, gave results. Advised of recent visit with PHARMD. Repeat blood work in 4-6 weeks. Patient verbalized understanding.

## 2021-06-19 ENCOUNTER — Encounter (HOSPITAL_COMMUNITY): Payer: 59

## 2021-06-19 NOTE — Progress Notes (Signed)
NEUROLOGY FOLLOW UP OFFICE NOTE  Caleb Decock [id number]  Assessment/Plan:   Multiple sclerosis Cervical myelopathy - asymptomatic.  Neurosurgery defers intervention at this time.  1  Will have him fill out forms by the pharmaceutical company for assistance to cover [place].  We will also look into any possible [facility] coverage for use of the infusion facility.   2.  Check CBC w/diff, CMP, vit D and quantitative immunoglobulin panel 3. Follow up 6 months.  Subjective:  Wane Mollett is a 55 year old left-handed black man with non-ischemic cardiomyopathy, hypertension and history of stroke who follows up for multiple sclerosis.  MRIs from May personally reviewed.   UPDATE: Current DMT:  Ocre[facility]us.  Other medications:  D3 4000 IU daily  12/13/2020 LABS:  IgA 440, IgG 1,677, IgM 27; vit D 10.33.  Advised to start D3 4000 IU daily but he hasn't continued taking it.    He was supposed to have his next Ocrevus infusion yesterday but his insurance lapsed and cannot get it until end of October or early-November. We did contact the patient in July to verify change in insurance but he never called [place] back.   03/03/2021 MRI BRAIN W WO:  Stable exam since November of [2021]. Combination of findings likely representing the presence of chronic demyelinating disease, chronic small-vessel disease, and old cortical infarction in the right frontoparietal junction region. 03/12/2021 MRI C-SPINE W WO:  1. Similar short-segment dorsal cord signal abnormality at C2/C3, which could relate to prior demyelination versus compressive myelopathy given degenerative cord mass effect at these levels. No definite new cord lesion or cord enhancement on this motion limited exam.  2. Similar multilevel degenerative change 03/12/2021 MRI T-SPINE W WO:  1. No convincing cord signal abnormality or enhancement on this mildly motion limited exam. 2. Left paracentral disc protrusion at T9-T10 contacts and flattens  the left ventral cord and narrows the left root entry zone with overall mild canal stenosis. Additional small posterior disc bulges at T6-T7, T7-T8 and T8-T9 which partially efface ventral CSF.  3. Moderate left foraminal stenosis at T8-T9, T9-T10, T10-T11, and T11-T12 with additional multilevel mild foraminal stenosis. 4. Small right pleural effusion with rounded 1.1 cm opacity in the right lower lobe (series 20, image 35), suboptimally evaluated on this study. This may represent consolidation or atelectasis, but pulmonary nodule is not excluded and CT of the chest is recommended to further characterize (particularly given the patient's smoking history).  MRI of cervical spine redemonstrated evidence of myelopathy at C2/3 due to compression from cnetral disc osteophyte complex.  He was advised to follow up with neurosurgery.  Because he was asymptomatic, surgical intervention was not recommended.    MRI of thoracic spine showed small right pleural effusion with rounded 1.1 cm opacity in the right lower lobe.  PCP ordered CT chest which no longer demonstrated the opacity indicating that it was likely rounded atelectasis vs pneumonia.   Vision:  No issues Motor:  Left greater than right legs feel heavy as the day progresses.  Upper extremities feel fine.   Sensory:  No issues Pain:  No issues Gait:  No issues Bowel/Bladder:  Decreased bowel movements. Fatigue:  No issues Cognition:  No issues Mood:  No issues   HISTORY:  In 2007, he was working at [employer] and he tripped over something and fell forward landing on his arms.  After he fell, his arms were contracted.  He was told it was due to trauma  and symptoms resolved after a month.   He was admitted to [facility] on 07/17/17 for diplopia and disequilibrium.  Exam revealed disconjugate gaze, with bilateral horizontal gaze palsy with inward deviation.  CT of head showed old right parietal infarct.  MRI of brain showed encephalomalacia in  the bilateral pons and right parietal lobe but no acute findings.  Findings in the pons was determined to represent the cause of symptoms, now chronic due to late presentation.  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     In August, he woke up with left arm weakness and arm and hand were slight contracted.  He also noticed that his speech was not normal.  He noted some mild numbness in the left arm as well.  No associated headache, visual disturbance.  He presented to the ED about a week later on 06/15/19 for further evaluation.  CT of brain showed atrophy and chronic small vessel ischemic changes but no acute intracranial abnormality.   MRI of brain without contrast, which was limited due to motion artifact, showed pathy confluent T2/FLAIR signal abnormality involving the periventricular cerebral white matter and pons.  Concern that this may represent demyelinating disease.  No associated restricted diffusion to suggest active demyelination.  MRI of cervical spine without contrast, also limited due to motion artifact, showed patchy cord signal abnormality involving the upper cervical spinal cord at level of C2-3 and C3-4 with no obvious cord expansion to suggest active demyelinating disease.  Also demonstrated, multilevel cervical spondylosis with diffuse mild spinal stenosis at C2-C3 through C5-C6, mild right C4 foraminal narrowing and mild to moderate left C5 foraminal narrowing and mild bilateral C7 foraminal narrowing.  Neurologic exam in the ED revealed questionable mild dysarthria but otherwise no focal or lateralizing findings.  MRI was reviewed by the neurohospitalist who questioned that findings represented a new demyelinating process.  He was discharged with outpatient neurology follow up. MRI of brain and cervical spine from 09/19/2019 showed cerebral white matter disease suggestive of MS and probable underlying chronic small vessel ischemic changes.  Cervical spine  showed hyperintensity and cord mass effect at the C2-3 and C3-4 levels with associated degenerative spinal stenosis rather than MS lesions.  He was referred to neurosurgery but he never received a call to schedule an appointment.  He underwent LP on 09/29/2019 where CSF analysis demonstrated over 5 oligoclonal bands and IgG index 1.82.   Labs: 09/29/2019 Lumbar Puncture:  CSF cell count 28/96% lymphs, culture with moderate WBCs but no organisms, glucose 75, protein 55, myelin basic protein <2, >5 oligoclonal bands, IgG index 1.82, cytology no malignant cells 10/11/2019 LABS:  JCV antibody positive with index of 1.66.     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  PAST MEDICAL HISTORY: Past Medical History:  Diagnosis Date   Acute CHF (congestive heart failure) ([place]) 02/10/2017   Dyspnea    Hypertension    Stroke [facility])     MEDICATIONS: Current Outpatient Medications on File  Prior to Visit  Medication Sig Dispense Refill   aspirin 81 MG tablet Take 1 tablet (81 mg total) by mouth daily. 30 tablet 3   atorvastatin (LIPITOR) 80 MG tablet Take 1 tablet (80 mg total) by mouth daily at 6 PM. 90 tablet 1   Blood Glucose Monitoring Suppl (TRUE METRIX METER) w/Device KIT Use as instructed to check blood sugar BID. 1 kit 0   carvedilol (COREG) 12.5 MG tablet Take 2 tablets (25 mg total) by mouth 2 (two)  times daily with a meal.     cloNIDine (CATAPRES) 0.1 MG tablet TAKE 1 TABLET BY MOUTH TWICE DAILY. 480 tablet 0   empagliflozin (JARDIANCE) 10 MG TABS tablet TAKE 1 TABLET BY MOUTH ONCE DAILY BEFORE BREAKFAST 90 tablet 0   ezetimibe (ZETIA) 10 MG tablet Take 1 tablet (10 mg total) by mouth daily. 90 tablet 1   furosemide (LASIX) 80 MG tablet TAKE 1 TABLET BY MOUTH DAILY. 90 tablet 0   glucose blood test strip USE AS DIRECTED TWICE DAILY. 300 strip 0   metFORMIN (GLUCOPHAGE) 1000 MG tablet Take 1 tablet (1,000 mg total) by mouth 2 (two) times daily with a meal. 180 tablet 1   sacubitril-valsartan (ENTRESTO) 97-103 MG Take 1 tablet by mouth 2 (two) times daily. 180 tablet 1   spironolactone (ALDACTONE) 25 MG tablet TAKE 1/2 TABLET BY MOUTH ONCE DAILY. 135 tablet 0   TRUEplus Lancets 28G MISC Use to check blood sugar twice daily. 100 each 6   No current facility-administered medications on file prior to visit.    ALLERGIES: No Known Allergies  FAMILY HISTORY: Family History  Problem Relation Age of Onset   Hypertension Mother    Cardiomyopathy Mother    Stroke Maternal Aunt    Multiple sclerosis Sister       Objective:  Blood pressure (!) 168/118, pulse 79, height _0  (1.727 m), weight (!) 327 lb (148.3 kg), SpO2 97 %. General: No acute distress.  Patient appears well-groomed.   Head:  Normocephalic/atraumatic Eyes:  Fundi examined but not visualized Neck: supple, no paraspinal tenderness, full range of motion Heart:  Regular rate and rhythm Lungs:  Clear to auscultation bilaterally Back: No paraspinal tenderness Neurological Exam:  alert and oriented to person, place, and time. Attention span and concentration intact, recent and remote memory intact, fund of knowledge intact.  Speech fluent and not dysarthric, language intact.  CN II-XII intact. Bulk and tone normal, Mildly reduced finger-thumb speed on left; otherwise muscle strength 5/5 throughout.  Sensation to light touch,  temperature and vibration intact.  Deep tendon reflexes 2+ throughout, toes downgoing.  Finger to nose and heel to shin testing intact.  Mildly wide-based gait.  Romberg negative.   Metta Clines, DO  CC: Phill Myron, MD

## 2021-06-20 ENCOUNTER — Other Ambulatory Visit: Payer: Self-pay

## 2021-06-20 ENCOUNTER — Encounter: Payer: Self-pay | Admitting: Neurology

## 2021-06-20 ENCOUNTER — Ambulatory Visit (INDEPENDENT_AMBULATORY_CARE_PROVIDER_SITE_OTHER): Payer: Self-pay | Admitting: Neurology

## 2021-06-20 ENCOUNTER — Other Ambulatory Visit (INDEPENDENT_AMBULATORY_CARE_PROVIDER_SITE_OTHER): Payer: Self-pay

## 2021-06-20 VITALS — BP 168/118 | HR 79 | Ht 68.0 in | Wt 327.0 lb

## 2021-06-20 DIAGNOSIS — G959 Disease of spinal cord, unspecified: Secondary | ICD-10-CM

## 2021-06-20 DIAGNOSIS — G35 Multiple sclerosis: Secondary | ICD-10-CM

## 2021-06-20 LAB — CBC WITH DIFFERENTIAL/PLATELET
Basophils Absolute: 0 10*3/uL (ref 0.0–0.1)
Basophils Relative: 0.5 % (ref 0.0–3.0)
Eosinophils Absolute: 0.3 10*3/uL (ref 0.0–0.7)
Eosinophils Relative: 3.2 % (ref 0.0–5.0)
HCT: 40 % (ref 39.0–52.0)
Hemoglobin: 13.1 g/dL (ref 13.0–17.0)
Lymphocytes Relative: 19.1 % (ref 12.0–46.0)
Lymphs Abs: 1.7 10*3/uL (ref 0.7–4.0)
MCHC: 32.9 g/dL (ref 30.0–36.0)
MCV: 94 fl (ref 78.0–100.0)
Monocytes Absolute: 1 10*3/uL (ref 0.1–1.0)
Monocytes Relative: 10.6 % (ref 3.0–12.0)
Neutro Abs: 6 10*3/uL (ref 1.4–7.7)
Neutrophils Relative %: 66.6 % (ref 43.0–77.0)
Platelets: 307 10*3/uL (ref 150.0–400.0)
RBC: 4.25 Mil/uL (ref 4.22–5.81)
RDW: 14.6 % (ref 11.5–15.5)
WBC: 9 10*3/uL (ref 4.0–10.5)

## 2021-06-20 LAB — COMPREHENSIVE METABOLIC PANEL
ALT: 18 U/L (ref 0–53)
AST: 18 U/L (ref 0–37)
Albumin: 4.1 g/dL (ref 3.5–5.2)
Alkaline Phosphatase: 58 U/L (ref 39–117)
BUN: 20 mg/dL (ref 6–23)
CO2: 26 mEq/L (ref 19–32)
Calcium: 9.6 mg/dL (ref 8.4–10.5)
Chloride: 104 mEq/L (ref 96–112)
Creatinine, Ser: 0.97 mg/dL (ref 0.40–1.50)
GFR: 87.22 mL/min (ref >60.00)
Glucose, Bld: 106 mg/dL — ABNORMAL HIGH (ref 70–99)
Potassium: 4.2 mEq/L (ref 3.5–5.1)
Sodium: 139 mEq/L (ref 135–145)
Total Bilirubin: 0.7 mg/dL (ref 0.2–1.2)
Total Protein: 8.2 g/dL (ref 6.0–8.3)

## 2021-06-20 NOTE — Patient Instructions (Addendum)
Will need to fill out forms to try and start you on Ocrevus again Check CBC with diff, CMP, quantitative immunoglobulin panel and vitamin D level Follow up 6 month Your provider has requested that you have labwork completed today. Please go to [name] ([id number]) on the second floor of this building before leaving the office today. You do not need to check in. If you are not called within 15 minutes please check with the front desk.

## 2021-06-21 LAB — IGG, IGA, IGM
IgG (Immunoglobin G), Serum: 1723 mg/dL — ABNORMAL HIGH (ref 600–1640)
IgM, Serum: 30 mg/dL — ABNORMAL LOW (ref 50–300)
Immunoglobulin A: 480 mg/dL — ABNORMAL HIGH (ref 47–310)

## 2021-06-23 ENCOUNTER — Telehealth: Payer: Self-pay

## 2021-06-23 NOTE — Telephone Encounter (Signed)
New message    Letter from [employer] contacted patient insurance plan [employer] patient insurance is no longer active.

## 2021-06-27 LAB — 25-HYDROXY VITAMIN D LCMS D2+D3
25-Hydroxy, Vitamin D-2: <1 ng/mL
25-Hydroxy, Vitamin D-3: 11 ng/mL
25-Hydroxy, Vitamin D: 11 ng/mL — ABNORMAL LOW

## 2021-07-03 ENCOUNTER — Other Ambulatory Visit: Payer: Self-pay

## 2021-07-03 ENCOUNTER — Other Ambulatory Visit: Payer: Self-pay | Admitting: Physician Assistant

## 2021-07-03 MED FILL — Clonidine HCl Tab 0.1 MG: ORAL | 30 days supply | Qty: 60 | Fill #2 | Status: AC

## 2021-07-03 MED FILL — Spironolactone Tab 25 MG: ORAL | 30 days supply | Qty: 15 | Fill #0 | Status: AC

## 2021-07-03 NOTE — Progress Notes (Signed)
Pt advised of his lab results. And to start Vitamin D3 4000 IU daily.

## 2021-07-03 NOTE — Telephone Encounter (Signed)
Requested medication (s) are due for refill today - yes  Requested medication (s) are on the active medication list -yes  Future visit scheduled - no  Last refill: 04/18/21  Notes to clinic: Patient provider- [facility] Rx may need to be forwarded to them.   Requested Prescriptions  Pending Prescriptions Disp Refills   empagliflozin (JARDIANCE) 10 MG TABS tablet 90 tablet 0    Sig: TAKE 1 TABLET BY MOUTH ONCE DAILY BEFORE BREAKFAST     There is no refill protocol information for this order     furosemide (LASIX) 80 MG tablet 90 tablet 0    Sig: TAKE 1 TABLET BY MOUTH DAILY.     There is no refill protocol information for this order       Requested Prescriptions  Pending Prescriptions Disp Refills   empagliflozin (JARDIANCE) 10 MG TABS tablet 90 tablet 0    Sig: TAKE 1 TABLET BY MOUTH ONCE DAILY BEFORE BREAKFAST     There is no refill protocol information for this order     furosemide (LASIX) 80 MG tablet 90 tablet 0    Sig: TAKE 1 TABLET BY MOUTH DAILY.     There is no refill protocol information for this order

## 2021-07-07 ENCOUNTER — Other Ambulatory Visit: Payer: Self-pay

## 2021-07-09 ENCOUNTER — Ambulatory Visit (INDEPENDENT_AMBULATORY_CARE_PROVIDER_SITE_OTHER): Payer: Self-pay | Admitting: Pharmacist

## 2021-07-09 ENCOUNTER — Other Ambulatory Visit: Payer: Self-pay

## 2021-07-09 VITALS — BP 140/98 | HR 94 | Resp 15 | Ht 68.0 in | Wt 332.3 lb

## 2021-07-09 DIAGNOSIS — I5042 Chronic combined systolic (congestive) and diastolic (congestive) heart failure: Secondary | ICD-10-CM

## 2021-07-09 MED ORDER — CARVEDILOL 25 MG PO TABS
25.0000 mg | ORAL_TABLET | Freq: Two times a day (BID) | ORAL | 1 refills | Status: DC
Start: 2021-07-09 — End: 2022-06-11
  Filled 2021-07-09 – 2021-07-17 (×2): qty 60, 30d supply, fill #0
  Filled 2021-08-26: qty 60, 30d supply, fill #1
  Filled 2021-10-24: qty 60, 30d supply, fill #2
  Filled 2021-12-26: qty 60, 30d supply, fill #0
  Filled 2022-03-02: qty 60, 30d supply, fill #1
  Filled 2022-04-24: qty 60, 30d supply, fill #2

## 2021-07-09 NOTE — Patient Instructions (Addendum)
It was good seeing you again!  We would like your blood pressure to be less than 130/80  Begin taking two of your Entresto 49-51mg  tablets twice a day.  When you finish them you can start the 97-103mg  dose twice a day  I will change your carvedilol to 25mg  tablet twice a day so you only have to take 1 tablet twice a day  Continue spironolactone 12.5mg  (1/2 tablet) once daily Continue Jardiance 10mg  once daily Continue furosemide 80mg  once daily  Continue to weigh yourself and let know if you gain more than 3 pounds overnight or 5 pounds in a week  Recheck in 4 weeks  , PharmD, BCACP, CDCES, CPP [facility] [address], [place], [facility] [address] Phone: [phone]; Fax: [phone] 07/09/2021 10:03 AM

## 2021-07-09 NOTE — Progress Notes (Signed)
Patient ID: Nicholas Kelly.                 DOB: 10/22/1966                      MRN: [id number]     HPI: Nicholas Quam. is a 55 y.o. male referred by Dr. Claiborne Billings to pharmacy clinic for HF medication management. PMH is significant for CHF, DM, stroke, and multiple sclerosis. Most recent LVEF 50-55% on 04/09/21.  Today she returns to pharmacy clinic for further medication titration. At last visit with Dr Claiborne Billings, losartan was switched to [name].  At first pharmacy titration visit, Nicholas Kelly was increased to 97-103 mg BID.  Unfortunately patient has not started this yet.  Did not realize he could take two of his previous dose.  Symptomatically, he is feeling well, denies dizziness, lightheadedness, and fatigue. Denies chest pain or palpitations. Denies SOB. Able to complete all ADLs.  Does continue to have LEE but reports he does not always take his furosemide 57m because of how frequently is makes him urinate.  Current CHF meds:  Entresto 97-103 BID Spironolactone 12.512mdaily,  Carvedilol 12.50m57m2tabs) BID  jardiance 56m17mily Furosemide 80mg650mly  BP goal: <130/80   Wt Readings from Last 3 Encounters:  06/20/21 (!) 327 lb (148.3 kg)  05/28/21 (!) 335 lb 9.6 oz (152.2 kg)  05/12/21 (!) 335 lb 6.4 oz (152.1 kg)   BP Readings from Last 3 Encounters:  06/20/21 (!) 168/118  05/28/21 (!) 158/80  05/12/21 (!) 150/100   Pulse Readings from Last 3 Encounters:  06/20/21 79  05/28/21 80  05/12/21 80    Renal function: CrCl cannot be calculated (Unknown ideal weight.).  Past Medical History:  Diagnosis Date   Acute CHF (congestive heart failure) (HCC) [place]18/2018   Dyspnea    Hypertension    Stroke (HCC)[facility] Current Outpatient Medications on File Prior to Visit  Medication Sig Dispense Refill   aspirin 81 MG tablet Take 1 tablet (81 mg total) by mouth daily. 30 tablet 3   atorvastatin (LIPITOR) 80 MG tablet Take 1 tablet (80 mg total) by mouth daily at 6 PM.  90 tablet 1   Blood Glucose Monitoring Suppl (TRUE METRIX METER) w/Device KIT Use as instructed to check blood sugar BID. 1 kit 0   carvedilol (COREG) 12.5 MG tablet Take 2 tablets (25 mg total) by mouth 2 (two) times daily with a meal.     cloNIDine (CATAPRES) 0.1 MG tablet TAKE 1 TABLET BY MOUTH TWICE DAILY. 480 tablet 0   empagliflozin (JARDIANCE) 10 MG TABS tablet TAKE 1 TABLET BY MOUTH ONCE DAILY BEFORE BREAKFAST 90 tablet 0   ezetimibe (ZETIA) 10 MG tablet Take 1 tablet (10 mg total) by mouth daily. 90 tablet 1   furosemide (LASIX) 80 MG tablet TAKE 1 TABLET BY MOUTH DAILY. 90 tablet 0   glucose blood test strip USE AS DIRECTED TWICE DAILY. 300 strip 0   metFORMIN (GLUCOPHAGE) 1000 MG tablet Take 1 tablet (1,000 mg total) by mouth 2 (two) times daily with a meal. 180 tablet 1   sacubitril-valsartan (ENTRESTO) 97-103 MG Take 1 tablet by mouth 2 (two) times daily. 180 tablet 1   spironolactone (ALDACTONE) 25 MG tablet TAKE 1/2 TABLET BY MOUTH ONCE DAILY. 135 tablet 0   TRUEplus Lancets 28G MISC Use to check blood sugar twice daily. 100 each 6   No current facility-administered medications  on file prior to visit.    No Known Allergies   Assessment/Plan:  1. CHF -  Patient's BP 140/98 which is above goal of <130/80 but is improving and patient appears to feel better.  Would like to discontinue or reduce dose of furosemide however advised against it until patient is on max dose of entresto. Recommended he begin taking two tablets of his 49-42m tablets until complete.  Will also switch his carvedilol 12.568mtablets to 25 mg tablet to reduce pill burden.  Gave patient tablet organizer to help with medication compliance.    Continue spironolactone 12.63m563maily Continue Entresto 97-103m59mD Continue carvedilol 263mg52m Continue Jardiance 10mg 33my Continue furosemide 80mg d32m Recheck in 4 weeks  Chris PKarren CobbleD, BCACP, CDCES, [place] [id number] [phone] 4:11 PM

## 2021-07-14 ENCOUNTER — Other Ambulatory Visit: Payer: Self-pay

## 2021-07-16 ENCOUNTER — Other Ambulatory Visit: Payer: Self-pay

## 2021-07-17 ENCOUNTER — Other Ambulatory Visit: Payer: Self-pay

## 2021-07-30 ENCOUNTER — Ambulatory Visit (INDEPENDENT_AMBULATORY_CARE_PROVIDER_SITE_OTHER): Payer: 59 | Admitting: Cardiovascular Disease

## 2021-07-30 ENCOUNTER — Other Ambulatory Visit: Payer: Self-pay

## 2021-07-30 ENCOUNTER — Encounter: Payer: Self-pay | Admitting: Cardiovascular Disease

## 2021-07-30 DIAGNOSIS — I428 Other cardiomyopathies: Secondary | ICD-10-CM | POA: Diagnosis not present

## 2021-07-30 DIAGNOSIS — I493 Ventricular premature depolarization: Secondary | ICD-10-CM

## 2021-07-30 DIAGNOSIS — I10 Essential (primary) hypertension: Secondary | ICD-10-CM | POA: Diagnosis not present

## 2021-07-30 DIAGNOSIS — E785 Hyperlipidemia, unspecified: Secondary | ICD-10-CM | POA: Diagnosis not present

## 2021-07-30 DIAGNOSIS — G35 Multiple sclerosis: Secondary | ICD-10-CM

## 2021-07-30 DIAGNOSIS — I5042 Chronic combined systolic (congestive) and diastolic (congestive) heart failure: Secondary | ICD-10-CM | POA: Diagnosis not present

## 2021-07-30 DIAGNOSIS — R0683 Snoring: Secondary | ICD-10-CM

## 2021-07-30 MED ORDER — SPIRONOLACTONE 25 MG PO TABS
12.5000 mg | ORAL_TABLET | Freq: Two times a day (BID) | ORAL | 3 refills | Status: DC
  Filled 2021-07-30 – 2021-08-26 (×2): qty 30, 30d supply, fill #0
  Filled 2021-10-24: qty 30, 30d supply, fill #1

## 2021-07-30 NOTE — Patient Instructions (Addendum)
Medication Instructions:  Take spironolactone 12.5 mg twice a day.  *If you need a refill on your cardiac medications before your next appointment, please call your pharmacy*  Testing:  Your physician has recommended that you have a sleep study.   This test records several body functions during sleep, including: brain activity, eye movement, oxygen and carbon dioxide blood levels, heart rate and rhythm, breathing rate and rhythm, the flow of air through your mouth and nose, snoring, body muscle movements, and chest and belly movement.  Follow-Up: At [facility], you and your health needs are our priority.  As part of our continuing mission to provide you with exceptional heart care, we have created designated Provider Care Teams.  These Care Teams include your primary Cardiologist (physician) and Advanced Practice Providers (APPs -  Physician Assistants and Nurse Practitioners) who all work together to provide you with the care you need, when you need it.  We recommend signing up for the patient portal called "MyChart".  Sign up information is provided on this After Visit Summary.  MyChart is used to connect with patients for Virtual Visits (Telemedicine).  Patients are able to view lab/test results, encounter notes, upcoming appointments, etc.  Non-urgent messages can be sent to your provider as well.   To learn more about what you can do with MyChart, go to [email or web address].    Your next appointment:   3 month(s)  The format for your next appointment:   In Person  Provider:   Nicki Guadalajara, MD

## 2021-07-31 ENCOUNTER — Other Ambulatory Visit: Payer: Self-pay

## 2021-08-03 ENCOUNTER — Encounter: Payer: Self-pay | Admitting: Cardiovascular Disease

## 2021-08-03 NOTE — Progress Notes (Signed)
Cardiology Office Note    Date:  08/03/2021   ID:  [name], DOB [birth year], MRN [id number]  PCP:  Nicolette Bang, MD  Cardiologist:  [name], MD   3 month follow-up visit.  History of Present Illness:  Nicholas Kelly. is a 55 y.o. male who presents for a 3 month follow-up cardiology evaluation.  Nicholas Kelly has a history of hypertension, and presented to [facility] hospital on 02/10/2017 from urgent care with acute onset of shortness of breath.  He was previously diagnosed with CHF in October 17 and had been on HCTZ but had run out of this prescription.  He was admitted by the hospitalist service.  A chest x-ray showed findings of CHF with pulmonary edema.  BNP was elevated at 752.  An echo Doppler study showed reduced LV function with an EF of 20-25% with moderate concentric LVH, diffuse hypokinesis, grade 3 diastolic dysfunction, mild mitral regurgitation, moderate tricuspid regurgitation, and moderate bony hypertension with a PA pressure 51 mm.  Patient was seen in cardiology consultation.  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  He did not have significant obstructive CAD with only diffuse 30-50% mid and distal RCA stenoses.  There is moderate pulmonary hypertension.  He was discharged on 02/14/2017 and has been on lisinopril 5 mg, furosemide 40 mg, carvedilol 6.25 mg twice a day in addition to aspirin 81 mg, supplemental potassium 20, now quit once and atorvastatin 40 mg.    I saw for initial post hospital evaluation on 03/11/2017.  I reviewed his hospital data in its entirety.  He was feeling improved on his initial medical regimen and I recommended further titration of his lisinopril up to 10 mg and increase carvedilol to 12.5 mg twice a day.  He did not have insurance and ws working on trying to obtain a  Medicaid card.  We talked in the future about possibly switching him to entresto.  On his increase regimen, he has felt improved.    When I saw him in June 2018 his blood pressure continued to be elevated and I recommended the addition of spironolactone at 12.5 mg for one week and then to increase this to 12.5 mg twice a day.  I also titrated lisinopril from 10 mg to 15 mg. On July 17, 2017 he presented to the hospital with complaints of diplopia and visual disturbance. 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  An MRI of the range, did not show acute abnormality but revealed chronic ischemic microangiopathy.  He had a negative CTA, except for minimal calcified atherosclerosis involving only the carotids.  During his hospitalization.  A repeat echo Doppler study showed an EF of 30% with grade 1 diastolic dysfunction.  His aortic root was mildly dilated.  Neurology was consulted and he was felt to have had a likely CVA given his reduced LV function.  He underwent outpatient neurologic rehab.  When I saw him in October 2018 blood pressure was still elevated and I further titrated lisinopril to 20 mg.  He was also taking Spironolactone 12.5 mg twice a day, furosemide 40 mg daily, carvedilol 12.5 twice daily in addition to atorvastatin 80 mg and Plavix and aspirin.  When I saw him in April 2019 he had run out of  his lisinopril for the last 2 months.  His allergist had increase aspirin to 325 and he had continued to be on Plavix, carvedilol 12.5 twice a day, furosemide 40 mg and HCTZ 25 mg.  He had lost 35 pounds since October 2018.  He was no longer taking Spironolactone.  With his reduced EF I felt he was a good candidate for initiation of Entresto.  He was gradually been titrated up to maximum dose 97/103 and  saw Nicholas Kelly off [place] in May 2019 at which time the dose was titrated to maximum.  Apparently he took that dose for a  month since he had a 30-day free samples but unfortunately when he [facility] the prescription to [facility] they were unable to fill it since he had no insurance.  When I last saw him on July 26, 2018 he was restarted with samples of and on Entresto at 49/51 mg.  At the time he also was on carvedilol 12.5 mg twice a day, furosemide 60 mg daily without edema.  He was on atorvastatin 80 mg for hyperlipidemia.    I him in January 2020 and over the past several months prior to that evaluation he  felt improved with reinitiation of Entresto therapy.  At times he still noted some abdominal bloating.  He was  on carvedilol 12.5 twice a day, clonidine 0.1 mg twice a day, furosemide 60 mg daily and apparently HCTZ 25 mg.  During that evaluation, he had no signs of edema and appears euvolemic on exam.  So that I could further titrate Entresto back to maximum dosing, I suggested he discontinue HCTZ and also suggested he decrease Lasix.  For several weeks I also recommended slight additional titration of carvedilol.  At his office visit  in August 2020 he was no longer taking Entresto since his samples had run out over 3 months previously.  As result he noticed recurrent symptomatology with shortness of breath.  During that evaluation, I provided him with samples of Entresto 24/26 for him to take twice a day for 2 weeks and gave him another prescription for 49/51 for titration.  He was scheduled to see his pharmacist for follow-up evaluation.  I saw him on January 06, 2021 and prior to that evaluation I had not seen him since  August 2020 but he had been evaluated by [name] subsequent  to my evaluation.  At his March 2022 evaluation he was no longer on Entresto and has been on carvedilol 12.5 mg twice a day, furosemide 80 mg daily, losartan 50 mg daily.  He apparently has been taking supplemental potassium, clonidine 0.1 mg twice a day and continues to be on atorvastatin 80 mg for hyperlipidemia and Metformin  500 mg twice a day for diabetes mellitus.  He is followed by Dr. Tomi Likens for his multiple sclerosis.  Recent LDL level was 104.  During that evaluation, his blood pressure was elevated.  I recommended he discontinue supplemental potassium and initiated spironolactone 12.5 mg daily.  He continued to be on Jardiance 10 mg and was tolerating this well.  Recommended that he have a follow-up echo Doppler study for reassessment of LV function.  [name] underwent a follow-up echo Doppler study on April 09, 2021.  EF was improved now in the low normal range at 50 to 55% with frequent PVCs noted throughout the study.  There was mild LVH.  He had hyperdynamic RV function.  Compared to his prior study in 2018 he has had increased from  20 to 25%.  I last saw him on May 12, 2021 at which time he was having frequent  urination and was taking furosemide 80 mg daily, Jardiance 10 mg, spironolactone 12.5 mg daily, losartan 50 mg, in addition to clonidine 0.1 mg twice a day and carvedilol 12.5 mg twice a day.  He continues to be on atorvastatin 80 mg.  He now had insurance.  He admits to weight gain.  During that evaluation since he now had insurance with [facility] [employer] I recommended reinstitution of Entresto and discontinuance of losartan.  I provided him with samples of Entresto 49/51 mg twice a day.  With his increased heart rate in the 80s and frequent PVCs I also recommended titration of carvedilol from 12.5 mg twice a day to 18.7 mg twice a day for the next week with then further increasing to 25 mg twice a day.  He continues to be on atorvastatin for hyperlipidemia.  Since my last evaluation he was seen by our pharmacist, Nicholas Kelly, [place] on 2 occasions with titration of Entresto to 97/103 mg twice a day.  Presently, he feels well.  He continues to have issues with obesity and blood pressure has been elevated.  He had undergone laboratory by Dr. Tomi Likens and he was started on supplemental over-the-counter vitamin D  at just 4000 units daily, although his vitamin D level was significantly depressed at 11.  He denies any chest pain.  He denies presyncope or syncope.  Upon further questioning, he does admit to poor sleep, has frequent awakenings, snoring, and daytime somnolence.  He presents for evaluation.  Past Medical History:  Diagnosis Date   Acute CHF (congestive heart failure) ([place]) 02/10/2017   Dyspnea    Hypertension    Stroke [facility])     Past Surgical History:  Procedure Laterality Date   NO PAST SURGERIES     RIGHT/LEFT HEART CATH AND CORONARY ANGIOGRAPHY N/A 02/12/2017   Procedure: Right/Left Heart Cath and Coronary Angiography;  Surgeon: Belva Crome, MD;  Location: [place] CV LAB;  Service: Cardiovascular;  Laterality: N/A;    Current Medications: Outpatient Medications Prior to Visit  Medication Sig Dispense Refill   aspirin 81 MG tablet Take 1 tablet (81 mg total) by mouth daily. 30 tablet 3   atorvastatin (LIPITOR) 80 MG tablet Take 1 tablet (80 mg total) by mouth daily at 6 PM. 90 tablet 1   Blood Glucose Monitoring Suppl (TRUE METRIX METER) w/Device KIT Use as instructed to check blood sugar BID. 1 kit 0   carvedilol (COREG) 25 MG tablet Take 1 tablet (25 mg total) by mouth 2 (two) times daily with a meal. 180 tablet 1   cloNIDine (CATAPRES) 0.1 MG tablet TAKE 1 TABLET BY MOUTH TWICE DAILY. 480 tablet 0   empagliflozin (JARDIANCE) 10 MG TABS tablet TAKE 1 TABLET BY MOUTH ONCE DAILY BEFORE BREAKFAST 90 tablet 0   ezetimibe (ZETIA) 10 MG tablet Take 1 tablet (10 mg total) by mouth daily. 90 tablet 1   furosemide (LASIX) 80 MG tablet TAKE 1 TABLET BY MOUTH DAILY. 90 tablet 0   glucose blood test strip USE AS DIRECTED TWICE DAILY. 300 strip 0   metFORMIN (GLUCOPHAGE) 1000 MG tablet Take 1 tablet (1,000 mg total) by mouth 2 (two) times daily with a meal. 180 tablet 1   sacubitril-valsartan (ENTRESTO) 97-103 MG Take 1 tablet by mouth 2 (two) times daily. 180 tablet 1   TRUEplus Lancets  28G MISC Use to check blood sugar  twice daily. 100 each 6   spironolactone (ALDACTONE) 25 MG tablet TAKE 1/2 TABLET BY MOUTH ONCE DAILY. 135 tablet 0   No facility-administered medications prior to visit.     Allergies:   Patient has no known allergies.   Social History   Socioeconomic History   Marital status: Divorced    Spouse name: Not on file   Number of children: 4   Years of education: Not on file   Highest education level: High school graduate  Occupational History   Not on file  Tobacco Use   Smoking status: Former    Packs/day: 1.00    Years: 34.00    Pack years: 34.00    Types: Cigarettes    Quit date: 06/28/2016    Years since quitting: 5.1   Smokeless tobacco: Never  Vaping Use   Vaping Use: Never used  Substance and Sexual Activity   Alcohol use: Not Currently    Comment: stopped in 2018   Drug use: Yes    Comment: 02/10/2017 "experimented w/drugs; haven't done any for years"   Sexual activity: Not Currently  Other Topics Concern   Not on file  Social History Narrative   Lives in one story home with daughter; Left handed; some college; caffeine - occasional coffee not daily; walks 2xweek   Social Determinants of Health   Financial Resource Strain: Not on file  Food Insecurity: Not on file  Transportation Needs: Not on file  Physical Activity: Not on file  Stress: Not on file  Social Connections: Not on file     Family History:  The patient's family history includes Cardiomyopathy in his mother; Hypertension in his mother; Multiple sclerosis in his sister; Stroke in his maternal aunt.   ROS General: Negative; No fevers, chills, or night sweats;  HEENT: positive for recent visual disturbance. sinus congestion, difficulty swallowing Pulmonary: Negative; No cough, wheezing, shortness of breath, hemoptysis Cardiovascular: See HPI GI: Negative; No nausea, vomiting, diarrhea, or abdominal pain GU: Negative; No dysuria, hematuria, or difficulty  voiding Musculoskeletal: Negative; no myalgias, joint pain, or weakness Hematologic/Oncology: Negative; no easy bruising, bleeding Endocrine: Negative; no heat/cold intolerance; no diabetes Neuro: positive for previous diplopia Skin: Negative; No rashes or skin lesions Psychiatric: Negative; No behavioral problems, depression Sleep: snoring, daytime sleepiness, hypersomnolence, no bruxism, restless legs, hypnogognic hallucinations, no cataplexy Other comprehensive 14 point system review is negative.   PHYSICAL EXAM:   VS:  BP (!) 172/123   Pulse 81   Ht '5\' 8"'  (1.727 m)   Wt (!) 323 lb 6.4 oz (146.7 kg)   SpO2 96%   BMI 49.17 kg/m     I believe the blood pressure initially recorded above is an accurate.  Repeat blood pressure by me was 150/88  Wt Readings from Last 3 Encounters:  07/30/21 (!) 323 lb 6.4 oz (146.7 kg)  07/09/21 (!) 332 lb 4.8 oz (150.7 kg)  06/20/21 (!) 327 lb (148.3 kg)   General: Alert, oriented, no distress.  Morbid obesity Skin: normal turgor, no rashes, warm and dry HEENT: Normocephalic, atraumatic. Pupils equal round and reactive to light; sclera anicteric; extraocular muscles intact;  Nose without nasal septal hypertrophy Mouth/Parynx benign; Mallinpatti scale 3 Neck: No JVD, no carotid bruits; normal carotid upstroke Lungs: clear to ausculatation and percussion; no wheezing or rales Chest wall: without tenderness to palpitation Heart: PMI not displaced, RRR, s1 s2 normal, 1/6 systolic murmur, no diastolic murmur, no rubs, gallops, thrills, or heaves Abdomen: Central adiposity; soft, nontender; no  hepatosplenomehaly, BS+; abdominal aorta nontender and not dilated by palpation. Back: no CVA tenderness Pulses 2+ Musculoskeletal: full range of motion, normal strength, no joint deformities Extremities: no clubbing cyanosis or edema, Homan's sign negative  Neurologic: grossly nonfocal; Cranial nerves grossly wnl Psychologic: Normal mood and  affect   Studies/Labs Reviewed:   May 12, 2021 ECG (independently read by me): Normal sinus rhythm at 80 bpm, sinus arrhythmia, PVC, Right branch block, left ventricular hypertrophy and left axis deviation. A repeat ECG was also performed which showed sinus rhythm at 84 with frequent PVCs, possible left atrial enlargement, left axis deviation, right bundle branch block, and LVH.  January 06, 2021 ECG (independently read by me): Sinus rhythm at 87, PVCs, RBBB, LAHB, LVH  June 15, 2019 ECG (independently read by me): NSR at 64; RBBB  November 22, 2018 ECG (independently read by me): Sinus rhythm with occasional PACs as well as PVC.  Left axis deviation.  Right bundle branch block.  LVH by voltage.  July 26, 2018 ECG (independently read by me): Sinus bradycardia at 53 bpm.  Right bundle branch block. LVH by voltage; normal intervals  February 20, 2018 ECG (independently read by me): Normal sinus rhythm at 74 bpm.  Left axis deviation.  Right bundle branch block.  LVH by voltage criteria.  QTc interval 492 ms.  August 18 2017 ECG (independently read by me): Normal sinus rhythm at 66 bpm.  Right bundle branch block with repolarization changes.  Inferolateral T change.  T-wave changes.  LVH.  QTc interval 461 ms.  04/21/2017 EKG:  EKG is ordered today. Normal sinus rhythm at 77 bpm.  LVH with repolarization changes.  PR interval 184 ms.  QTc interval 488 ms.  03/11/2017 ECG (independently read by me): Normal sinus rhythm at 73 bpm.  Left axis deviation.  LVH by voltage criteria with repolarization changes.  Recent Labs: BMP Latest Ref Rng & Units 06/20/2021 05/19/2021 06/27/2020  Glucose 70 - 99 mg/dL 106(H) 123(H) 108(H)  BUN 6 - 23 mg/dL '20 19 17  ' Creatinine 0.40 - 1.50 mg/dL 0.97 1.01 1.23  BUN/Creat Ratio 9 - 20 - 19 14  Sodium 135 - 145 mEq/L 139 142 143  Potassium 3.5 - 5.1 mEq/L 4.2 4.7 4.7  Chloride 96 - 112 mEq/L 104 102 104  CO2 19 - 32 mEq/L '26 22 27  ' Calcium 8.4 - 10.5 mg/dL 9.6  9.9 9.9     Hepatic Function Latest Ref Rng & Units 06/20/2021 05/19/2021 11/09/2019  Total Protein 6.0 - 8.3 g/dL 8.2 7.7 7.5  Albumin 3.5 - 5.2 g/dL 4.1 4.2 -  AST 0 - 37 U/L '18 13 18  ' ALT 0 - 53 U/L '18 17 20  ' Alk Phosphatase 39 - 117 U/L 58 70 -  Total Bilirubin 0.2 - 1.2 mg/dL 0.7 0.8 0.7  Bilirubin, Direct 0.1 - 0.5 mg/dL - - -    CBC Latest Ref Rng & Units 06/20/2021 05/19/2021 08/22/2020  WBC 4.0 - 10.5 K/uL 9.0 9.3 9.8  Hemoglobin 13.0 - 17.0 g/dL 13.1 13.2 12.6(L)  Hematocrit 39.0 - 52.0 % 40.0 40.3 41.2  Platelets 150.0 - 400.0 K/uL 307.0 300 343   Lab Results  Component Value Date   MCV 94.0 06/20/2021   MCV 93 05/19/2021   MCV 97 08/22/2020   Lab Results  Component Value Date   TSH 3.140 05/19/2021   Lab Results  Component Value Date   HGBA1C 6.9 (H) 05/19/2021     BNP  Component Value Date/Time   BNP 7.2 12/18/2019 1556   BNP 752.4 (H) 02/10/2017 1525    ProBNP    Component Value Date/Time   PROBNP 15 05/19/2021 0819     Lipid Panel     Component Value Date/Time   CHOL 205 (H) 05/19/2021 0819   TRIG 121 05/19/2021 0819   HDL 36 (L) 05/19/2021 0819   CHOLHDL 5.7 (H) 05/19/2021 0819   CHOLHDL 4.9 07/18/2017 0558   VLDL 11 07/18/2017 0558   LDLCALC 147 (H) 05/19/2021 0819     RADIOLOGY: No results found.   Additional studies/ records that were reviewed today include:  I reviewed the patient's recent hospitalization, his cardiac consultation, cardiac catheterization report, and hospitalist records. I reviewed his hospitalization from September 22 through 07/19/2017 including imaging studies and echocardiogram.  ECHO 04/09/2021 IMPRESSIONS   1. Heart rate varied during the study - possible atrial arrythmias and  frequent PVC's were noted, also making LVEF determination difficult. Left  ventricular ejection fraction, by estimation, is 50 to 55%. The left  ventricle has low normal function. The  left ventricle has no regional wall motion  abnormalities. There is mild  left ventricular hypertrophy. Indeterminate diastolic filling due to E-A  fusion.   2. Right ventricular systolic function is hyperdynamic. The right  ventricular size is normal.   3. The mitral valve is grossly normal. Trivial mitral valve  regurgitation.   4. The aortic valve is tricuspid. Aortic valve regurgitation is not  visualized.   Comparison(s): Prior images unable to be directly viewed, comparison made  by report only. Changes from prior study are noted. 07/18/2017: LVEF  20-25%.   ASSESSMENT:    1. Non-ischemic cardiomyopathy ([place])   2. Chronic combined systolic and diastolic CHF (congestive heart failure) ([place])   3. Essential hypertension   4. Frequent PVCs   5. Morbid obesity ([place])   6. Snores   7. Hyperlipidemia LDL goal <70   8. Multiple sclerosis [facility])     PLAN:  Nicholas Kelly is a 55 year-old African-American gentleman who has a history of morbid obesity and has documented reduced EF noted in April 2018.  He has been an excellent candidate for [facility] and previously had been uptitrated to 97/103 twice daily with significant improvement but unfortunately had stopped therapy when he could not get additional medication.  He  was able to be restarted back on Entresto and  was titrated to 49/51 and later 97/103 mm successfully.  Fortunately, again he stopped using Entresto when his samples ran out which he believes may have been sometime in March or April 2020.  When I saw him in August 2020 I again reinstituted therapy.  When most recently seen in March 2022, he was no longer taking Entresto unfortunately due to cost issues and not having insurance.  He gained significant weight up to 335 when last evaluated in July 2022.  He is subsequently obtained insurance with [employer] and has been successfully titrated back to maximum Entresto dosing at 97/103 mg twice daily.  He also has been titrated to carvedilol 25 mg twice a day and has been on  Jardiance 10 mg and spironolactone 12.5 mg daily.  Today I am further titrating spironolactone to 12.5 mg twice a day which should offer benefit in his blood pressure as well.  He is vitamin D deficient and was recently started on vitamin D supplementation by Dr. Tomi Likens at 4000 units but may benefit from 50,000 units once a week for  3 months but I will defer this to Dr. Tomi Likens.  He has symptoms also suggestive of sleep apnea with snoring, frequent awakenings, poor sleep, and some daytime somnolence although an Epworth scale calculated in the office today was not elevated at 6.  We did discuss a possible sleep evaluation in the future.  He continues to be on atorvastatin 80 mg for hyperlipidemia and Zetia was recently added.  In September 2020 LDL cholesterol had decreased to 73 but had subsequently risen to 147 earlier this year.  He is not having any chest pain.  He is diabetic on metformin and Jardiance.  I will see him in 3 to 4 months for follow-up evaluation.   Medication Adjustments/Labs and Tests Ordered: Current medicines are reviewed at length with the patient today.  Concerns regarding medicines are outlined above.  Medication changes, Labs and Tests ordered today are listed in the Patientt Instructions below. Patient Instructions  Medication Instructions:  Take spironolactone 12.5 mg twice a day.  *If you need a refill on your cardiac medications before your next appointment, please call your pharmacy*  Testing:  Your physician has recommended that you have a sleep study.   This test records several body functions during sleep, including: brain activity, eye movement, oxygen and carbon dioxide blood levels, heart rate and rhythm, breathing rate and rhythm, the flow of air through your mouth and nose, snoring, body muscle movements, and chest and belly movement.  Follow-Up: At [facility], you and your health needs are our priority.  As part of our continuing mission to provide you with  exceptional heart care, we have created designated Provider Care Teams.  These Care Teams include your primary Cardiologist (physician) and Advanced Practice Providers (APPs -  Physician Assistants and Nurse Practitioners) who all work together to provide you with the care you need, when you need it.  We recommend signing up for the patient portal called "MyChart".  Sign up information is provided on this After Visit Summary.  MyChart is used to connect with patients for Virtual Visits (Telemedicine).  Patients are able to view lab/test results, encounter notes, upcoming appointments, etc.  Non-urgent messages can be sent to your provider as well.   To learn more about what you can do with MyChart, go to [email or web address].    Your next appointment:   3 month(s)  The format for your next appointment:   In Person  Provider:   [name], MD    Signed, [name], MD , [facility] 08/03/2021 11:45 AM    [facility] [address] [place], [place], [place]  [address] Phone: [phone]

## 2021-08-05 ENCOUNTER — Telehealth: Payer: Self-pay | Admitting: *Deleted

## 2021-08-05 NOTE — Telephone Encounter (Signed)
Prior Authorization for split night sleep study sent to [facility] via web portal. Tracking Number [id number].

## 2021-08-07 ENCOUNTER — Other Ambulatory Visit: Payer: Self-pay

## 2021-08-18 ENCOUNTER — Telehealth: Payer: Self-pay

## 2021-08-18 ENCOUNTER — Telehealth: Payer: Self-pay | Admitting: Neurology

## 2021-08-18 NOTE — Telephone Encounter (Signed)
New message   Fax sent to the plan Your PA has been faxed to the plan as a paper copy. Please contact the plan directly if you haven't received a determination in a typical timeframe.  You will be notified of the determination via fax.  Alphonse Kotula (Key: [id number])Need help? Call us at [phone] Status Sent to [employer] The plan will fax you a determination, typically within 1 to 5 business days.  How do I follow up? Drug Ocrevus 300MG solution Form [name] Medication Precertification Request Form Precertification Request for Specialty Drugs for Commercial Members [phone]

## 2021-08-18 NOTE — Telephone Encounter (Signed)
Pt is calling to get info on his ocrevus. York Spaniel he has not heard anything in two weeks. I let him know, it can take up to 2-4 weeks depending upon the ins. Sent info to gesila, she is asking a nurse to clarify things

## 2021-08-19 ENCOUNTER — Telehealth: Payer: Self-pay

## 2021-08-19 NOTE — Telephone Encounter (Signed)
Lmom to r/s pharmd appt  

## 2021-08-20 NOTE — Telephone Encounter (Signed)
08/18/21  Note   New message    Fax sent to the plan Your PA has been faxed to the plan as a paper copy. Please contact the plan directly if you haven't received a determination in a typical timeframe.   You will be notified of the determination via fax.   Nicholas Kelly (Key: [id number])Need help? Call us at [phone] Status Sent to [employer] The plan will fax you a determination, typically within 1 to 5 business days.   How do I follow up? Drug Ocrevus 300MG solution Form [name] Medication Precertification Request Form Precertification Request for Specialty Drugs for Commercial Members [phone]

## 2021-08-20 NOTE — Telephone Encounter (Signed)
PA pending. Nicholas Kelly will advised one she receives a response.

## 2021-08-26 ENCOUNTER — Other Ambulatory Visit: Payer: Self-pay

## 2021-08-26 ENCOUNTER — Telehealth (INDEPENDENT_AMBULATORY_CARE_PROVIDER_SITE_OTHER): Payer: 59 | Admitting: Nurse Practitioner

## 2021-08-26 ENCOUNTER — Encounter: Payer: Self-pay | Admitting: Nurse Practitioner

## 2021-08-26 ENCOUNTER — Other Ambulatory Visit: Payer: Self-pay | Admitting: Physician Assistant

## 2021-08-26 DIAGNOSIS — E119 Type 2 diabetes mellitus without complications: Secondary | ICD-10-CM

## 2021-08-26 DIAGNOSIS — I10 Essential (primary) hypertension: Secondary | ICD-10-CM

## 2021-08-26 MED ORDER — EMPAGLIFLOZIN 10 MG PO TABS
ORAL_TABLET | Freq: Every day | ORAL | 0 refills | Status: DC
  Filled 2021-08-26 – 2021-10-24 (×3): qty 30, 30d supply, fill #0

## 2021-08-26 MED ORDER — FUROSEMIDE 80 MG PO TABS
ORAL_TABLET | Freq: Every day | ORAL | 1 refills | Status: DC
  Filled 2021-08-26: qty 30, 30d supply, fill #0
  Filled 2021-10-24: qty 30, 30d supply, fill #1
  Filled 2022-03-02: qty 30, 30d supply, fill #0
  Filled 2022-06-11: qty 30, 30d supply, fill #1

## 2021-08-26 MED ORDER — CLONIDINE HCL 0.1 MG PO TABS
ORAL_TABLET | Freq: Two times a day (BID) | ORAL | 1 refills | Status: DC
Start: 2021-08-26 — End: 2021-12-01
  Filled 2021-08-26: qty 60, 30d supply, fill #0
  Filled 2021-10-24: qty 60, 30d supply, fill #1

## 2021-08-26 NOTE — Telephone Encounter (Signed)
   Notes to clinic: Not sure patient of [facility]- may be patient at [facility]. Please review   Requested Prescriptions  Pending Prescriptions Disp Refills   furosemide (LASIX) 80 MG tablet 90 tablet 0    Sig: TAKE 1 TABLET BY MOUTH DAILY.     There is no refill protocol information for this order     cloNIDine (CATAPRES) 0.1 MG tablet 480 tablet 0    Sig: TAKE 1 TABLET BY MOUTH TWICE DAILY.     There is no refill protocol information for this order       Requested Prescriptions  Pending Prescriptions Disp Refills   furosemide (LASIX) 80 MG tablet 90 tablet 0    Sig: TAKE 1 TABLET BY MOUTH DAILY.     There is no refill protocol information for this order     cloNIDine (CATAPRES) 0.1 MG tablet 480 tablet 0    Sig: TAKE 1 TABLET BY MOUTH TWICE DAILY.     There is no refill protocol information for this order

## 2021-08-26 NOTE — Telephone Encounter (Signed)
Received call from the patient requesting a refill on Clonidine and Lasix.   Chart reviewed.   Rx(s) sent to pharmacy electronically.

## 2021-08-26 NOTE — Patient Instructions (Signed)
Type 2 diabetes mellitus without complication, without long-term current use of insulin:  A1c 6.9, Has been out of Jardiance. Continue current medication regimen.  Counseled on Diabetic diet, my plate method, 811 minutes of moderate intensity exercise/week Blood sugar logs with fasting goals of 80-120 mg/dl, random of less than 572 and in the event of sugars less than 60 mg/dl or greater than 620 mg/dl encouraged to notify the clinic.  - empagliflozin (JARDIANCE) 10 MG TABS tablet; Take 1 tablet (10 mg total) by mouth daily before breakfast.  Dispense: 30 tablet; Refill: 0  Follow up:  Follow up with Dr. Andrey Campanile in 4-8 weeks

## 2021-08-26 NOTE — Progress Notes (Signed)
Virtual Visit via Video Note  I connected with Nicholas Kelly. on 08/26/21 at  2:00 PM EDT by a video enabled telemedicine application and verified that I am speaking with the correct person using two identifiers.  Location: Patient: home Provider: office   I discussed the limitations of evaluation and management by telemedicine and the availability of in person appointments. The patient expressed understanding and agreed to proceed.  History of Present Illness:  Patient presents for medication refill. Previous patient of Dr. Earlene Plater.  The patient needs Jardiance refilled today.  He states that he has been out of his medication for about a month now.  He states that he does check his blood sugar at home and it has been ranging between 117-123.  Patient did have hemoglobin A1c checked by cardiologist in July.  His hemoglobin A1c at that time was 6.9.  Overall patient states that he is doing well.  We will refill his medication today. Denies f/c/s, n/v/d, hemoptysis, PND, chest pain or edema.      Observations/Objective:  Vitals with BMI 07/30/2021 07/09/2021 06/20/2021  Height 5\' 8"  5\' 8"  5\' 8"   Weight 323 lbs 6 oz 332 lbs 5 oz 327 lbs  BMI 49.18 50.54 49.73  Systolic 172 140  Diastolic 123 98 118  Pulse 81 94 79   Lab Results  Component Value Date   HGBA1C 6.9 (H) 05/19/2021      Assessment and Plan:  Type 2 diabetes mellitus without complication, without long-term current use of insulin:  A1c 6.9, Has been out of Jardiance. Continue current medication regimen.  Counseled on Diabetic diet, my plate method, minutes of moderate intensity exercise/week Blood sugar logs with fasting goals of 80-120 mg/dl, random of less than 812 and in the event of sugars less than 60 mg/dl or greater than 05/21/2021 mg/dl encouraged to notify the clinic.  - empagliflozin (JARDIANCE) 10 MG TABS tablet; Take 1 tablet (10 mg total) by mouth daily before breakfast.  Dispense: 30 tablet;  Refill: 0  Follow up:  Follow up with Dr. 751 in 4-8 weeks    I discussed the assessment and treatment plan with the patient. The patient was provided an opportunity to ask questions and all were answered. The patient agreed with the plan and demonstrated an understanding of the instructions.   The patient was advised to call back or seek an in-person evaluation if the symptoms worsen or if the condition fails to improve as anticipated.  I provided 23 minutes of non-face-to-face time during this encounter.   700, NP

## 2021-08-28 ENCOUNTER — Other Ambulatory Visit: Payer: Self-pay

## 2021-09-02 NOTE — Telephone Encounter (Signed)
Patient notified of sleep study appointment scheduled on 10/13/21.

## 2021-09-02 NOTE — Telephone Encounter (Signed)
PA received for split night sleep study. Auth # [id number]. Valid dates 08/11/21 to 02/07/22.

## 2021-09-03 ENCOUNTER — Ambulatory Visit: Payer: 59 | Admitting: Family Medicine

## 2021-09-03 ENCOUNTER — Ambulatory Visit: Payer: Self-pay

## 2021-09-03 NOTE — Telephone Encounter (Signed)
Pt called in stating he would like an update on what is going on with his Ocrevus. He is a bit confused.

## 2021-09-03 NOTE — Telephone Encounter (Signed)
Advised pt will check the status of the PA and get back to the patient.

## 2021-09-04 NOTE — Telephone Encounter (Signed)
September 04, 2021 Me   GD   10:39 AM Note   F/u    Receive fax from [place]   We received a request for an appeal on 10.25.2022   We will send a written response within 30 calendar days of the date we received the request.      Me   GD   10:34 AM Note   F/u   Receive fax from [place] .     Our decision based on our review of the issues described in your appeal , our records show we resolved your request before reviewing your appeal.  This request has been forwarded to [facility] pre certification department for review. This was incorrectly sent to [facility] appeals department.

## 2021-09-04 NOTE — Telephone Encounter (Signed)
F/u   Receive fax from [place]  We received a request for an appeal on 10.25.2022  We will send a written response within 30 calendar days of the date we received the request.

## 2021-09-04 NOTE — Telephone Encounter (Signed)
F/u  Receive fax from [place] .    Our decision based on our review of the issues described in your appeal , our records show we resolved your request before reviewing your appeal.  This request has been forwarded to [facility] pre certification department for review. This was incorrectly sent to [facility] appeals department.

## 2021-09-11 ENCOUNTER — Ambulatory Visit (INDEPENDENT_AMBULATORY_CARE_PROVIDER_SITE_OTHER): Payer: 59 | Admitting: Pharmacist Clinician (PhC)/ Clinical Pharmacy Specialist

## 2021-09-11 ENCOUNTER — Encounter: Payer: Self-pay | Admitting: Pharmacist Clinician (PhC)/ Clinical Pharmacy Specialist

## 2021-09-11 ENCOUNTER — Other Ambulatory Visit: Payer: Self-pay

## 2021-09-11 VITALS — BP 160/102 | Ht 68.0 in | Wt 338.0 lb

## 2021-09-11 DIAGNOSIS — I5042 Chronic combined systolic (congestive) and diastolic (congestive) heart failure: Secondary | ICD-10-CM

## 2021-09-11 LAB — BASIC METABOLIC PANEL
BUN/Creatinine Ratio: 13 (ref 9–20)
BUN: 14 mg/dL (ref 6–24)
CO2: 24 mmol/L (ref 20–29)
Calcium: 10.1 mg/dL (ref 8.7–10.2)
Chloride: 101 mmol/L (ref 96–106)
Creatinine, Ser: 1.09 mg/dL (ref 0.76–1.27)
Glucose: 87 mg/dL (ref 70–99)
Potassium: 4.3 mmol/L (ref 3.5–5.2)
Sodium: 141 mmol/L (ref 134–144)
eGFR: 80 mL/min/{1.73_m2} (ref >59)

## 2021-09-11 MED ORDER — ENTRESTO 49-51 MG PO TABS: 1.0000 | ORAL_TABLET | Freq: Two times a day (BID) | ORAL | 0 refills | Status: DC

## 2021-09-11 NOTE — Patient Instructions (Signed)
Return for a a follow up appointment January 9 at 10 am  Go to the lab today to check kidney function  Check your blood pressure at home daily and keep record of the readings.  Call  [phone] to check on the copay card for Jardiance.   Take your BP meds as follows:  Take Entresto 49/51 mg tablets until you can talk to manufacturer about getting free med  Continue with 1 tablet of spironolactone twice daily.  When we get your blood results back we will call if this needs to be changed   Bring all of your meds, your BP cuff and your record of home blood pressures to your next appointment.  Exercise as you're able, try to walk approximately 30 minutes per day.  Keep salt intake to a minimum, especially watch canned and prepared boxed foods.  Eat more fresh fruits and vegetables and fewer canned items.  Avoid eating in fast food restaurants.    HOW TO TAKE YOUR BLOOD PRESSURE: Rest 5 minutes before taking your blood pressure.  Don't smoke or drink caffeinated beverages for at least 30 minutes before. Take your blood pressure before (not after) you eat. Sit comfortably with your back supported and both feet on the floor (don't cross your legs). Elevate your arm to heart level on a table or a desk. Use the proper sized cuff. It should fit smoothly and snugly around your bare upper arm. There should be enough room to slip a fingertip under the cuff. The bottom edge of the cuff should be 1 inch above the crease of the elbow. Ideally, take 3 measurements at one sitting and record the average.

## 2021-09-11 NOTE — Assessment & Plan Note (Signed)
Patient with CHF, EF recently improved to 50-55% on GDMT.  He does have issues with getting affordable medication, which should hopefully be resolved in January 2023.  He was given phone numbers to reach out to [place] [facility] [place], to see about getting free medication from them until his disability Medicaid insurance kicks in on Jan 1.  He was given samples of Entresto 49/51 to get him by with some medication until he can reach out to [employer].  He was also given samples of jardiance.  He had labs done this morning to check renal function/electrolytes.  There was confusion about his spironolactone, per the chart he was told to take 25 mg daily, but prescription at pharmacy was set at 25 mg bid.  He can continue this dose should his electrolytes be WNL.

## 2021-09-11 NOTE — Progress Notes (Signed)
09/11/2021 Nicholas Kelly. [birth year] [id number]   HPI:  Nicholas Kelly. is a 55 y.o. male patient of Dr Claiborne Billings, with a PMH below who presents today for hypertension clinic evaluation and heart failure medication titration.  He was seen by Dr. Claiborne Billings last month, at which time his pressure was elevated to 172/123.  At that time Dr. Claiborne Billings increased spironolactone to 25 mg daily.  He was on GDMT at that time with Entresto 97/103, Jardiance 10, carvedilol 25 and spironolactone.  He has had challenges paying for his medications in the past, and although been on Entresto for several years, has stopped and restarted multiple times.    Today he is in the office for follow up.  He notes that again he is having trouble paying for medications and has been out of both [place] for at least a week.  He will be getting Medicare/Medicaid disability benefits starting on January 1.  Until then he was told the [name] would be $800.  EF in 2018 was downt to 20-25%, now most recent echo shows low normal at 50-55%.    Past Medical History: hypertension Working to control with CHF medications  DM2 7/22 A1c 6.9 - on metformin 1 gm bid  hyperlipidemia 7/22 LDL 147 - on atorvastatin 80 mg  MS No current medications  CVA 3 years ago, not been able to work since then     Blood Pressure Goal:  130/80  Current Medications: Entresto 97/103, Carvedilol 25 mg, Jardiance 10 mg, spironolactone 25 mg, clonidine 0.1 mg bid,   Family Hx: mother had heart disease, died in her early 63; father died 69 cirrhosis of liver; no siblings; 4 children all healthy  Social Hx: no tobacco, no alcohol, coffee 2 cups per day  Diet: mostly home cooked, some vegetables, mostly canned; meats consist of baked chicken   Exercise: started [employer] Monday  Home BP readings: no home readings   Intolerances: nkda  Labs: 8/22:  Na 139, K 4.2, Glu 106, BUN 20, SCr 0.97, GFR 87.2   Wt Readings from  Last 3 Encounters:  09/11/21 (!) 338 lb (153.3 kg)  07/30/21 (!) 323 lb 6.4 oz (146.7 kg)  07/09/21 (!) 332 lb 4.8 oz (150.7 kg)   BP Readings from Last 3 Encounters:  09/11/21 (!) 160/102  07/30/21 (!) 172/123  07/09/21 (!) 140/98   Pulse Readings from Last 3 Encounters:  07/30/21 81  07/09/21 94  06/20/21 79    Current Outpatient Medications  Medication Sig Dispense Refill   sacubitril-valsartan (ENTRESTO) 49-51 MG Take 1 tablet by mouth 2 (two) times daily. 28 tablet 0   aspirin 81 MG tablet Take 1 tablet (81 mg total) by mouth daily. 30 tablet 3   atorvastatin (LIPITOR) 80 MG tablet Take 1 tablet (80 mg total) by mouth daily at 6 PM. 90 tablet 1   Blood Glucose Monitoring Suppl (TRUE METRIX METER) w/Device KIT Use as instructed to check blood sugar BID. 1 kit 0   carvedilol (COREG) 25 MG tablet Take 1 tablet (25 mg total) by mouth 2 (two) times daily with a meal. 180 tablet 1   cloNIDine (CATAPRES) 0.1 MG tablet TAKE 1 TABLET BY MOUTH TWICE DAILY. 180 tablet 1   empagliflozin (JARDIANCE) 10 MG TABS tablet TAKE 1 TABLET BY MOUTH ONCE DAILY BEFORE BREAKFAST 90 tablet 0   ezetimibe (ZETIA) 10 MG tablet Take 1 tablet (10 mg total) by mouth daily. 90 tablet 1  furosemide (LASIX) 80 MG tablet TAKE 1 TABLET BY MOUTH DAILY. 90 tablet 1   glucose blood test strip USE AS DIRECTED TWICE DAILY. 300 strip 0   metFORMIN (GLUCOPHAGE) 1000 MG tablet Take 1 tablet (1,000 mg total) by mouth 2 (two) times daily with a meal. 180 tablet 1   sacubitril-valsartan (ENTRESTO) 97-103 MG Take 1 tablet by mouth 2 (two) times daily. 180 tablet 1   spironolactone (ALDACTONE) 25 MG tablet Take 0.5 tablets (12.5 mg total) by mouth 2 (two) times daily. 90 tablet 3   TRUEplus Lancets 28G MISC Use to check blood sugar twice daily. 100 each 6   No current facility-administered medications for this visit.    No Known Allergies  Past Medical History:  Diagnosis Date   Acute CHF (congestive heart failure)  ([place]) 02/10/2017   Dyspnea    Hypertension    Stroke ([place])     Blood pressure (!) 160/102, height '5\' 8"'  (1.727 m), weight (!) 338 lb (153.3 kg).  Chronic combined systolic and diastolic CHF (congestive heart failure) ([place]) Patient with CHF, EF recently improved to 50-55% on GDMT.  He does have issues with getting affordable medication, which should hopefully be resolved in January 2023.  He was given phone numbers to reach out to [place] and [place], to see about getting free medication from them until his disability Medicaid insurance kicks in on Jan 1.  He was given samples of Entresto 49/51 to get him by with some medication until he can reach out to [employer].  He was also given samples of jardiance.  He had labs done this morning to check renal function/electrolytes.  There was confusion about his spironolactone, per the chart he was told to take 25 mg daily, but prescription at pharmacy was set at 25 mg bid.  He can continue this dose should his electrolytes be WNL.     Tommy Medal PharmD CPP [facility] [address] [place], [place] [address] [phone]

## 2021-09-23 ENCOUNTER — Ambulatory Visit: Payer: 59 | Admitting: Family Medicine

## 2021-10-09 ENCOUNTER — Telehealth: Payer: Self-pay | Admitting: Neurology

## 2021-10-09 NOTE — Telephone Encounter (Signed)
Nicholas Kelly from [facility] needs updated orders. His appt is the 21st of dec but she is trying to put him on monday the 19th. Ocrevus infusion.she needs a call back before 12 tomorrow

## 2021-10-10 ENCOUNTER — Other Ambulatory Visit: Payer: Self-pay

## 2021-10-10 DIAGNOSIS — G35 Multiple sclerosis: Secondary | ICD-10-CM

## 2021-10-10 NOTE — Telephone Encounter (Signed)
Order added.

## 2021-10-13 ENCOUNTER — Ambulatory Visit (HOSPITAL_BASED_OUTPATIENT_CLINIC_OR_DEPARTMENT_OTHER): Payer: 59 | Attending: Cardiovascular Disease | Admitting: Cardiovascular Disease

## 2021-10-13 ENCOUNTER — Other Ambulatory Visit: Payer: Self-pay

## 2021-10-13 DIAGNOSIS — G4733 Obstructive sleep apnea (adult) (pediatric): Secondary | ICD-10-CM | POA: Insufficient documentation

## 2021-10-13 DIAGNOSIS — R0683 Snoring: Secondary | ICD-10-CM | POA: Diagnosis not present

## 2021-10-13 DIAGNOSIS — I110 Hypertensive heart disease with heart failure: Secondary | ICD-10-CM | POA: Insufficient documentation

## 2021-10-13 DIAGNOSIS — G478 Other sleep disorders: Secondary | ICD-10-CM | POA: Insufficient documentation

## 2021-10-13 DIAGNOSIS — I5042 Chronic combined systolic (congestive) and diastolic (congestive) heart failure: Secondary | ICD-10-CM | POA: Diagnosis not present

## 2021-10-13 DIAGNOSIS — E119 Type 2 diabetes mellitus without complications: Secondary | ICD-10-CM | POA: Insufficient documentation

## 2021-10-15 ENCOUNTER — Encounter (HOSPITAL_COMMUNITY): Payer: Medicaid Other

## 2021-10-22 ENCOUNTER — Encounter (HOSPITAL_COMMUNITY): Payer: Medicaid Other

## 2021-10-24 ENCOUNTER — Other Ambulatory Visit: Payer: Self-pay

## 2021-10-27 ENCOUNTER — Encounter (HOSPITAL_BASED_OUTPATIENT_CLINIC_OR_DEPARTMENT_OTHER): Payer: Self-pay | Admitting: Cardiovascular Disease

## 2021-10-27 NOTE — Procedures (Signed)
Patient Name: Nicholas Kelly, Nicholas Kelly Date: 10/13/2021 Gender: Male D.O.B: 04/17/1966 Age (years): 76 Referring Provider: [name] MD, ABSM Height (inches): 68 Interpreting Physician: [name] MD, ABSM Weight (lbs): 330 RPSGT: Carolin Coy BMI: 50 MRN: [id number] Neck Size: 20.00  CLINICAL INFORMATION Sleep Study Type: Split Night CPAP  Indication for sleep study: Congestive Heart Failure, Diabetes, Excessive Daytime Sleepiness, Hypertension, Obesity, OSA  Epworth Sleepiness Score: 2  SLEEP STUDY TECHNIQUE As per the AASM Manual for the Scoring of Sleep and Associated Events v2.3 (April 2016) with a hypopnea requiring 4% desaturations.  The channels recorded and monitored were frontal, central and occipital EEG, electrooculogram (EOG), submentalis EMG (chin), nasal and oral airflow, thoracic and abdominal wall motion, anterior tibialis EMG, snore microphone, electrocardiogram, and pulse oximetry. Continuous positive airway pressure (CPAP) was initiated when the patient met split night criteria and was titrated according to treat sleep-disordered breathing.  MEDICATIONS aspirin 81 MG tablet atorvastatin (LIPITOR) 80 MG tablet Blood Glucose Monitoring Suppl (TRUE METRIX METER) w/Device KIT carvedilol (COREG) 25 MG tablet cloNIDine (CATAPRES) 0.1 MG tablet empagliflozin (JARDIANCE) 10 MG TABS tablet ezetimibe (ZETIA) 10 MG tablet furosemide (LASIX) 80 MG tablet glucose blood test strip metFORMIN (GLUCOPHAGE) 1000 MG tablet sacubitril-valsartan (ENTRESTO) 49-51 MG sacubitril-valsartan (ENTRESTO) 97-103 MG spironolactone (ALDACTONE) 25 MG tablet Medications self-administered by patient taken the night of the study : N/A  RESPIRATORY PARAMETERS Diagnostic Total AHI (/hr): 85.5 RDI (/hr): 88.7 OA Index (/hr): 4.8 CA Index (/hr): 0.0 REM AHI (/hr): 72.0 NREM AHI (/hr): 86.6 Supine AHI (/hr): 85.5 Non-supine AHI (/hr): 0 Min O2 Sat (%): 70.0 Mean O2  (%): 88.8 Time below 88% (min): 54.9   Titration Optimal Pressure (cm): 13 AHI at Optimal Pressure (/hr): 0 Min O2 at Optimal Pressure (%): 89.0 Supine % at Optimal (%): 100 Sleep % at Optimal (%): 99   SLEEP ARCHITECTURE The recording time for the entire night was 382 minutes.  During a baseline period of 185.8 minutes, the patient slept for 149.5 minutes in REM and nonREM, yielding a sleep efficiency of 80.5%%. Sleep onset after lights out was 8.2 minutes with a REM latency of 48.5 minutes. The patient spent 35.8%% of the night in stage N1 sleep, 59.2%% in stage N2 sleep, 0.0%% in stage N3 and 5% in REM.  During the titration period of 191.3 minutes, the patient slept for 145.0 minutes in REM and nonREM, yielding a sleep efficiency of 75.8%%. Sleep onset after CPAP initiation was 26.5 minutes with a REM latency of 92.5 minutes. The patient spent 15.2%% of the night in stage N1 sleep, 48.6%% in stage N2 sleep, 0.0%% in stage N3 and 36.2% in REM.  CARDIAC DATA The 2 lead EKG demonstrated sinus rhythm. The mean heart rate was 100.0 beats per minute. Other EKG findings include: PVCs.  LEG MOVEMENT DATA The total Periodic Limb Movements of Sleep (PLMS) were 0. The PLMS index was 0.0 .  IMPRESSIONS - Severe obstructive sleep apnea occurred during the diagnostic portion of the study (AHI 85.5/h; RDI 88.7/h).  CPA was initiated at 6 cm and was titrated to optimal PAP pressure at 13 cm of water) - No significant central sleep apnea occurred during the diagnostic portion of the study (CAI 0.0/h). - Severe  oxygen desaturation was noted during the diagnostic portion of the studyt a nadir of 70%. - The patient snored with moderate snoring volume during the diagnostic portion of the study. - EKG findings include PACs, PVCs. - Clinically significant periodic  limb movements did not occur during sleep.  DIAGNOSIS - Obstructive Sleep Apnea (G47.33)  RECOMMENDATIONS - Recommend an initial trial of  CPAP therapy with EPR of 3 at 13 cm H2O with heated humidification.  A Medium Wide size Philips Respironics Full Face Mask Dreamwear mask was used for the titration. - Effort should be made to optimize nasal and oropharyngeal patency.  - Avoid alcohol, sedatives and other CNS depressants that may worsen sleep apnea and disrupt normal sleep architecture. - Sleep hygiene should be reviewed to assess factors that may improve sleep quality. - Weight management and regular exercise should be initiated or continued. - Recommend a download in 30 days and sleep clinicevaluation after 4 weeks of therapy.  [Electronically signed] 10/27/2021 12:04 PM  [name] MD, [facility], [place], American Board of Sleep Medicine   NPI: [id number]  [place] PH: [phone]   FX: [phone] [place]

## 2021-10-29 ENCOUNTER — Non-Acute Institutional Stay (HOSPITAL_COMMUNITY)
Admission: RE | Admit: 2021-10-29 | Discharge: 2021-10-29 | Disposition: A | Discharge status: Home / Self Care | Payer: Medicare Other | Source: Ambulatory Visit | Attending: Internal Medicine | Admitting: Internal Medicine

## 2021-10-29 ENCOUNTER — Other Ambulatory Visit: Payer: Self-pay

## 2021-10-29 DIAGNOSIS — G35 Multiple sclerosis: Secondary | ICD-10-CM | POA: Insufficient documentation

## 2021-10-29 MED ORDER — DIPHENHYDRAMINE HCL 50 MG/ML IJ SOLN
25.0000 mg | Freq: Once | INTRAMUSCULAR | Status: AC
  Administered 2021-10-29: 25 mg via INTRAVENOUS
  Filled 2021-10-29: qty 1

## 2021-10-29 MED ORDER — SODIUM CHLORIDE 0.9 % IV SOLN
600.0000 mg | Freq: Once | INTRAVENOUS | Status: AC
  Administered 2021-10-29: 600 mg via INTRAVENOUS
  Filled 2021-10-29: qty 20

## 2021-10-29 MED ORDER — ACETAMINOPHEN 325 MG PO TABS
650.0000 mg | ORAL_TABLET | Freq: Once | ORAL | Status: AC
  Administered 2021-10-29: 650 mg via ORAL
  Filled 2021-10-29: qty 2

## 2021-10-29 MED ORDER — METHYLPREDNISOLONE SODIUM SUCC 125 MG IJ SOLR
100.0000 mg | Freq: Once | INTRAMUSCULAR | Status: AC
  Administered 2021-10-29: 100 mg via INTRAVENOUS
  Filled 2021-10-29: qty 2

## 2021-10-29 MED ORDER — SODIUM CHLORIDE 0.9 % IV SOLN: INTRAVENOUS | Status: DC | PRN

## 2021-10-29 NOTE — Progress Notes (Signed)
PATIENT CARE CENTER NOTE   Diagnosis: Multiple sclerosis (HCC) (G35)   Provider: Shon Millet, MD   Procedure: Ocrevus infusion    Note:  Patient received Ocrevus 600 mg infusion via PIV. Pre medications (Tylenol, IV Benadryl, IV Solumedrol) given per order. Infusion titrated per protocol. Patient tolerated infusion well with no adverse reaction. Observed patient for 30 minutes post infusion (patient declined to stay the full hour). Vital signs stable. Discharge instructions given. Patient to schedule next appointment for 6 months from today. Patient alert, oriented and ambulatory at discharge.

## 2021-11-03 ENCOUNTER — Other Ambulatory Visit: Payer: Self-pay

## 2021-11-03 ENCOUNTER — Telehealth: Payer: Self-pay | Admitting: Licensed Clinical Social Worker

## 2021-11-03 ENCOUNTER — Ambulatory Visit (INDEPENDENT_AMBULATORY_CARE_PROVIDER_SITE_OTHER): Payer: Self-pay | Admitting: Pharmacist

## 2021-11-03 DIAGNOSIS — I5042 Chronic combined systolic (congestive) and diastolic (congestive) heart failure: Secondary | ICD-10-CM

## 2021-11-03 DIAGNOSIS — E119 Type 2 diabetes mellitus without complications: Secondary | ICD-10-CM

## 2021-11-03 MED ORDER — EMPAGLIFLOZIN 10 MG PO TABS
ORAL_TABLET | Freq: Every day | ORAL | 0 refills | Status: DC
  Filled 2021-11-03 – 2021-11-27 (×2): qty 30, 30d supply, fill #0
  Filled 2022-02-03 – 2022-03-02 (×2): qty 30, 30d supply, fill #1
  Filled 2022-04-24: qty 30, 30d supply, fill #2

## 2021-11-03 MED ORDER — TRUE METRIX BLOOD GLUCOSE TEST VI STRP
ORAL_STRIP | 12 refills | Status: DC
  Filled 2021-11-03 – 2022-07-29 (×3): qty 100, 25d supply, fill #0

## 2021-11-03 MED ORDER — ENTRESTO 49-51 MG PO TABS: 1.0000 | ORAL_TABLET | Freq: Two times a day (BID) | ORAL | 2 refills | Status: DC

## 2021-11-03 NOTE — Progress Notes (Signed)
Heart and Vascular Care Navigation  11/03/2021  Nicholas Kelly. Feb 21, 1966 [id number]  Reason for Referral:  Engaged with patient by telephone for follow up visit for Heart and Vascular Care Coordination.                                                                                                   Assessment:                LCSW spoke with pt via telephone at [phone]. I introduced self, role, reason for call. Confirmed home address, that he has a new PCP and his daughters remain his primary contacts. His daughter Nicholas Kelly lives closest in [place]. He currently resides in a boarding house, he is eager to find a new place to live at this time. He understands that supply of affordable housing is tight. He manages his expenses on SSDI and is trying to stay between $640 and 800 a month. He receives SNAP benefits and drives.   He thought that his Medicaid was full coverage but per my f/u with [employer] it appears that he was only approved for [facility] Family Planning; I encouraged him to bring his [facility] Medicare card to all upcoming appointments since Family Planning does not cover specialty care or pharmacy benefits.            Pt interested in resources regarding how to understand Medicare benefits, as well as housing resources.            HRT/VAS Care Coordination     Patients Home Cardiology Office [facility]   Outpatient Care Team Social Worker   Social Worker Name: [place], LCSW, [facility]   Living arrangements for the past 2 months [employer]   Lives with: Other (Comment)  others in home   Patient Current Insurance Coverage Managed Medicare   Patient Has Concern With Paying Medical Bills No   Does Patient Have Prescription Coverage? Yes   Patient Prescription Assistance Programs Patient Assistance Programs   Home Assistive Devices/Equipment None       Social History:                                                                             SDOH  Screenings   Alcohol Screen: Not on file  Depression (PHQ2-9): Low Risk    PHQ-2 Score: 1  Financial Resource Strain: Medium Risk   Difficulty of Paying Living Expenses: Somewhat hard  Food Insecurity: No Food Insecurity   Worried About Programme researcher, broadcasting/film/video in the Last Year: Never true   Ran Out of Food in the Last Year: Never true  Housing: Low Risk    Last Housing Risk Score: 0  Physical Activity: Not on file  Social Connections: Not on file  Stress: Not on file  Tobacco Use: Medium Risk   Smoking Tobacco Use: Former   Smokeless Tobacco Use: Never   Passive Exposure: Not on file  Transportation Needs: No Transportation Needs   Lack of Transportation (Medical): No   Lack of Transportation (Non-Medical): No    SDOH Interventions: Financial Resources:  Financial Strain Interventions: Other (Comment) (pt on limited income, assistance apps for Entresto and [place] have been sent, options for housing also mailed)   Food Insecurity:  Food Insecurity Interventions: Intervention Not Indicated (pt recieves SNAP benefits)  Housing Insecurity:  Housing Interventions: Other (Comment) (pt would like alternate housing, sent [email or web address] options)  Transportation:   Transportation Interventions: Intervention Not Indicated    Other Care Navigation Interventions:     Provided Pharmacy assistance resources Patient Assistance Programs   Follow-up plan:   LCSW has mailed pt the following: my card, housing search results in pt price range, as well as SHIIP information and information about extra help program. Pt encouraged to complete assistance applications and return to the office to be submitted to PAP. I will f/u in one week regarding PAP and to see if resources received.

## 2021-11-03 NOTE — Patient Instructions (Signed)
It was nice meeting you today  We would like your blood pressure to be less than 130/80  I will refill your Entresto 49-51mg  twice a day and your Jardiance 10mg  once a day  Continue: Furosemide 80mg  daily Carvedilol 25mg  twice a day Spironolactone 25mg  once a day  I will have our social worker call you to help with housing and medication costs  Please fill out the paperwork and bring it back in for [place] to send to the company  Please call us with any questions  Karren Cobble, PharmD, [place], [place], [place], [place], [place], [address] Phone: [phone], Fax: [phone]

## 2021-11-03 NOTE — Progress Notes (Signed)
Patient ID: Nicholas Kelly.                 DOB: October 21, 1966                      MRN: [id number]     HPI: Nicholas Kelly. is a 56 y.o. male referred by Dr. Claiborne Billings to pharmacy clinic for HF medication management. PMH is significant for CHF, DM, HTN, MS, and obesity. Most recent LVEF 50-55% on 04/09/21.  Patient presents today in good spirits. Could not afford copays on Jardiance and entresto so he has not taken in over a month.  Recently enrolled in a Medicare plan.    Currently lives at boarding house in [facility]. Does not have an oven and reports his diet has been poor.  Is trying to work on Forensic psychologist and exercise. Has started going to the gym 2-3 times a week. Trying to eat more of a plant based diet.  Is looking to move into a new place soon, possibly in June or July.  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  Symptomatically he feels well. Denies dizziness, lightheadedness. Does feel fatigue and SOB after long walks.    Brought BP log: 12/15: 131/92, 130/92 12/14: 132/89, 128/78 12/13: 132/80, 143/82 12/12: 128/82, 147/85 12/11: 135/89, 155/87 12/10: 138/87, 132/89  Current CHF meds:   Furosemide 61m daily Carvedilol 262mBID Spironolactone 2515maily  Not currently taking Jardiance or Entresto  BP goal: <130/80  Wt Readings from Last 3 Encounters:  10/13/21 (!) 330 lb (149.7 kg)  09/11/21 (!) 338 lb (153.3 kg)  07/30/21 (!) 323 lb 6.4 oz (146.7 kg)   BP Readings from Last 3 Encounters:  10/29/21 (!) 142/86  09/11/21 (!) 160/102  07/30/21 (!) 172/123   Pulse Readings from Last 3 Encounters:  10/29/21 71  07/30/21 81  07/09/21 94    Renal function: CrCl cannot be calculated (Patient's most recent lab result is older than the maximum 21 days allowed.).  Past Medical History:  Diagnosis Date   Acute CH[place] ([place]ive heart failure) ([place]   Dyspnea    Hypertension    [facility]   Current Outpatient  Medications on File Prior to Visit  Medication Sig Dispense Refill   aspirin 81 MG tablet Take 1 tablet (81 mg total) by mouth daily. 30 tablet 3   atorvastatin (LIPITOR) 80 MG tablet Take 1 tablet (80 mg total) by mouth daily at 6 PM. 90 tablet 1   Blood Glucose Monitoring Suppl (TRUE METRIX METER) w/Device KIT Use as instructed to check blood sugar BID. 1 kit 0   carvedilol (COREG) 25 MG tablet Take 1 tablet (25 mg total) by mouth 2 (two) times daily with a meal. 180 tablet 1   cloNIDine (CATAPRES) 0.1 MG tablet TAKE 1 TABLET BY MOUTH TWICE DAILY. 180 tablet 1   empagliflozin (JARDIANCE) 10 MG TABS tablet TAKE 1 TABLET BY MOUTH ONCE DAILY BEFORE BREAKFAST 90 tablet 0   ezetimibe (ZETIA) 10 MG tablet Take 1 tablet (10 mg total) by mouth daily. 90 tablet 1   furosemide (LASIX) 80 MG tablet TAKE 1 TABLET BY MOUTH DAILY. 90 tablet 1   glucose blood test strip USE AS DIRECTED TWICE DAILY. 300 strip 0   metFORMIN (GLUCOPHAGE) 1000 MG tablet Take 1 tablet (1,000 mg total) by mouth 2 (two) times daily with a meal. 180 tablet 1   sacubitril-valsartan (ENTRESTO) 49-51  MG Take 1 tablet by mouth 2 (two) times daily. 28 tablet 0   sacubitril-valsartan (ENTRESTO) 97-103 MG Take 1 tablet by mouth 2 (two) times daily. 180 tablet 1   spironolactone (ALDACTONE) 25 MG tablet Take 0.5 tablets (12.5 mg total) by mouth 2 (two) times daily. 90 tablet 3   TRUEplus Lancets 28G MISC Use to check blood sugar twice daily. 100 each 6   No current facility-administered medications on file prior to visit.    No Known Allergies   Assessment/Plan:   1. CHF -  Patient BP in room today 145/97 which is above goal of <130/80.  Patient voices compliance with spironolactone, carvedilol and furosemide however has run out of [place].  Will refill prescriptions and gave patient patient assistance forms to fill out.  Will reach out to LCSW for assistance with medications and housing.    Will recheck in 4  weeks. Continue: Furosemide 60m daily Carvedilol 268mtwice a day Spironolactone 2580mnce a day Restart Jardiance 50m34mily Entresto 49-51mg22m  ChrisKarren CobblermD, BCACP, CDCESMount Healthy [id number]: [phone]: [phone]

## 2021-11-04 ENCOUNTER — Other Ambulatory Visit: Payer: Self-pay

## 2021-11-05 ENCOUNTER — Telehealth: Payer: Self-pay | Admitting: *Deleted

## 2021-11-05 NOTE — Telephone Encounter (Signed)
-----   Message from Lennette Bihari, MD sent at 10/27/2021 12:10 PM EST ----- Burna Mortimer, please notify pt and set up with DME for CPAP

## 2021-11-05 NOTE — Telephone Encounter (Signed)
Patient given sleep study results and recommendations. He inquired about the inspire device versus using CPAP. He was informed that he would have to try and fail CPAP first. Also he has a BMI of 50 and this is too high to qualify for the device. Patient voiced understanding and agrees to proceed with CPAP therapy. Order was sent to choice as a "urgent" request.

## 2021-11-07 ENCOUNTER — Other Ambulatory Visit: Payer: Self-pay

## 2021-11-27 ENCOUNTER — Other Ambulatory Visit: Payer: Self-pay

## 2021-11-27 ENCOUNTER — Other Ambulatory Visit: Payer: Self-pay | Admitting: Cardiovascular Disease

## 2021-11-27 DIAGNOSIS — E119 Type 2 diabetes mellitus without complications: Secondary | ICD-10-CM

## 2021-11-28 ENCOUNTER — Other Ambulatory Visit: Payer: Self-pay

## 2021-12-01 ENCOUNTER — Encounter: Payer: Self-pay | Admitting: Pharmacist Clinician (PhC)/ Clinical Pharmacy Specialist

## 2021-12-01 ENCOUNTER — Ambulatory Visit (INDEPENDENT_AMBULATORY_CARE_PROVIDER_SITE_OTHER): Payer: Medicare Other | Admitting: Pharmacist Clinician (PhC)/ Clinical Pharmacy Specialist

## 2021-12-01 ENCOUNTER — Other Ambulatory Visit: Payer: Self-pay

## 2021-12-01 DIAGNOSIS — I10 Essential (primary) hypertension: Secondary | ICD-10-CM

## 2021-12-01 DIAGNOSIS — I5042 Chronic combined systolic (congestive) and diastolic (congestive) heart failure: Secondary | ICD-10-CM | POA: Diagnosis not present

## 2021-12-01 DIAGNOSIS — E119 Type 2 diabetes mellitus without complications: Secondary | ICD-10-CM | POA: Diagnosis not present

## 2021-12-01 MED ORDER — SPIRONOLACTONE 25 MG PO TABS
12.5000 mg | ORAL_TABLET | Freq: Every day | ORAL | 3 refills | Status: DC
  Filled 2021-12-01 – 2021-12-26 (×2): qty 45, 90d supply, fill #0
  Filled 2022-04-24: qty 45, 90d supply, fill #1
  Filled 2022-07-28: qty 45, 90d supply, fill #2
  Filled 2022-10-21: qty 45, 90d supply, fill #3

## 2021-12-01 MED ORDER — CLONIDINE HCL 0.2 MG PO TABS
0.2000 mg | ORAL_TABLET | Freq: Two times a day (BID) | ORAL | 1 refills | Status: DC
  Filled 2021-12-01 – 2021-12-26 (×2): qty 180, 90d supply, fill #0
  Filled 2022-04-24: qty 180, 90d supply, fill #1

## 2021-12-01 NOTE — Progress Notes (Signed)
12/02/2021 Nicholas Kelly. [birth year] [id number]   HPI:  Nicholas Kelly. is a 55 y.o. male patient of Dr Claiborne Billings, with a PMH below who presents today for hypertension clinic evaluation and heart failure medication titration.  He was seen by Dr. Claiborne Billings last month, at which time his pressure was elevated to 172/123.  At that time Dr. Claiborne Billings increased spironolactone to 25 mg daily.  He was on GDMT at that time with Entresto 97/103, Jardiance 10, carvedilol 25 and spironolactone.  He has had challenges paying for his medications in the past, and although been on Entresto for several years, has stopped and restarted multiple times.  At his most recent visit (last month) he again had been off [place] and [place] because of costs.  He is currently on Medicare, with a $47/month copay for each of these medications.  He was given paperwork to apply for patient assistance from both manufacturers.    Today he returns for follow up.  He again has been without Entresto for some time.  He also is no longer taking spironolactone, unsure why, but apparently has to do with inability to get a refill when he was out of medication. Today he has paperwork to submit to manufacturers for free medication.  Patient admits he feels more tired without it, but cannot afford his insurance copays.  Otherwise, he is doing well, has started going to gym, nothing strenuous at this time, but walking to help with balance issues associated with MS.     Past Medical History: hypertension Working to control with CHF medications  DM2 7/22 A1c 6.9 - on metformin 1 gm bid  hyperlipidemia 7/22 LDL 147 - on atorvastatin 80 mg  MS Recently started Ocrevus q6 months  CVA 3 years ago, not been able to work since then  [facility] CPAP order placed in January     Blood Pressure Goal:  130/80  Current Medications: Entresto 97/103, Carvedilol 25 mg, Jardiance 10 mg, spironolactone 25 mg, clonidine 0.1 mg bid, clonidine 0.1 mg  bid  Family Hx: mother had heart disease, died in her early 45; father died 20 cirrhosis of liver; no siblings; 4 children all healthy  Social Hx: no tobacco, no alcohol, coffee 2 cups per day  Diet: mostly home cooked, some vegetables, mostly canned; meats consist of baked chicken   Exercise: started [employer] Monday - walking, 20-30 minutes at least 3 times per week;   Home BP readings: no home readings   Intolerances: nkda  Labs: 8/22:  Na 139, K 4.2, Glu 106, BUN 20, SCr 0.97, GFR 87.2   Wt Readings from Last 3 Encounters:  12/01/21 (!) 330 lb (149.7 kg)  11/03/21 (!) 337 lb (152.9 kg)  10/13/21 (!) 330 lb (149.7 kg)   BP Readings from Last 3 Encounters:  12/01/21 (!) 160/94  11/03/21 (!) 145/97  10/29/21 (!) 142/86   Pulse Readings from Last 3 Encounters:  12/01/21 64  11/03/21 77  10/29/21 71    Current Outpatient Medications  Medication Sig Dispense Refill   aspirin 81 MG tablet Take 1 tablet (81 mg total) by mouth daily. 30 tablet 3   atorvastatin (LIPITOR) 80 MG tablet Take 1 tablet (80 mg total) by mouth daily at 6 PM. 90 tablet 1   Blood Glucose Monitoring Suppl (TRUE METRIX METER) w/Device KIT Use as instructed to check blood sugar BID. 1 kit 0   carvedilol (COREG) 25 MG tablet Take 1 tablet (25 mg  total) by mouth 2 (two) times daily with a meal. 180 tablet 1   cloNIDine (CATAPRES) 0.2 MG tablet Take 1 tablet (0.2 mg total) by mouth 2 (two) times daily. 180 tablet 1   empagliflozin (JARDIANCE) 10 MG TABS tablet TAKE 1 TABLET BY MOUTH ONCE DAILY BEFORE BREAKFAST 90 tablet 0   ezetimibe (ZETIA) 10 MG tablet Take 1 tablet (10 mg total) by mouth daily. 90 tablet 1   furosemide (LASIX) 80 MG tablet TAKE 1 TABLET BY MOUTH DAILY. (Patient taking differently: Take 80 mg by mouth daily.) 90 tablet 1   glucose blood (TRUE METRIX BLOOD GLUCOSE TEST) test strip use as directed 100 strip 12   metFORMIN (GLUCOPHAGE) 1000 MG tablet Take 1 tablet (1,000 mg total) by mouth  2 (two) times daily with a meal. (Patient taking differently: Take 1,000 mg by mouth daily.) 180 tablet 1   ocrelizumab (OCREVUS) 300 MG/10ML injection Inject 600 mg into the vein once.     spironolactone (ALDACTONE) 25 MG tablet Take 0.5 tablets (12.5 mg total) by mouth daily. 45 tablet 3   TRUEplus Lancets 28G MISC Use to check blood sugar twice daily. 100 each 6   sacubitril-valsartan (ENTRESTO) 49-51 MG Take 1 tablet by mouth 2 (two) times daily. (Patient not taking: Reported on 12/01/2021) 60 tablet 2   No current facility-administered medications for this visit.    No Known Allergies  Past Medical History:  Diagnosis Date   Acute CHF (congestive heart failure) ([place]) 02/10/2017   Dyspnea    Hypertension    Stroke ([place])     Blood pressure (!) 160/94, pulse 64, resp. rate 17, height '5\' 8"'  (1.727 m), weight (!) 330 lb (149.7 kg), SpO2 98 %.  Essential hypertension Patient with hypertension, heart failure and multiple co-morbidities, still having difficulty getting medications consistently.  Will have RN submit paperwork to get Jardiance and Entresto at no cost.  Will give samples of the [facility] 49/51, though not his normal 97/103 mg dose, will at least get the medication in his system while waiting for manufacturer response.  Will also restart spironolactone at 12.5 mg daily and increase clonidine to 0.2 mg bid to control blood pressure.  He will need to get metabolic panel in 2 weeks and has a follow up with Dr. Claiborne Billings in March.    Tommy Medal PharmD CPP [facility] [address] [place], [place] [address] [phone]

## 2021-12-01 NOTE — Patient Instructions (Signed)
Return for a a follow up appointment with Dr. Tresa Endo in March  Go to the lab in 2 weeks to get kidney function checked  Take your BP meds as follows:  Increase clonidine to 0.2 mg twice daily (take 2 of the 0.1 mg tablets twice daily until they are gone)  Take Entresto 49/51 mg samples twice daily.  Re-start spironolactone 12.5 mg (1/2 tablet) daily   We will submit paperwork to get Sherryll Burger and Jardiance free from the manufacturers.  Bring all of your meds, your BP cuff and your record of home blood pressures to your next appointment.  Exercise as youre able, try to walk approximately 30 minutes per day.  Keep salt intake to a minimum, especially watch canned and prepared boxed foods.  Eat more fresh fruits and vegetables and fewer canned items.  Avoid eating in fast food restaurants.    HOW TO TAKE YOUR BLOOD PRESSURE: Rest 5 minutes before taking your blood pressure.  Dont smoke or drink caffeinated beverages for at least 30 minutes before. Take your blood pressure before (not after) you eat. Sit comfortably with your back supported and both feet on the floor (dont cross your legs). Elevate your arm to heart level on a table or a desk. Use the proper sized cuff. It should fit smoothly and snugly around your bare upper arm. There should be enough room to slip a fingertip under the cuff. The bottom edge of the cuff should be 1 inch above the crease of the elbow. Ideally, take 3 measurements at one sitting and record the average.

## 2021-12-02 ENCOUNTER — Encounter: Payer: Self-pay | Admitting: Pharmacist Clinician (PhC)/ Clinical Pharmacy Specialist

## 2021-12-02 NOTE — Assessment & Plan Note (Signed)
Patient with hypertension, heart failure and multiple co-morbidities, still having difficulty getting medications consistently.  Will have RN submit paperwork to get Jardiance and Entresto at no cost.  Will give samples of the [facility] 49/51, though not his normal 97/103 mg dose, will at least get the medication in his system while waiting for manufacturer response.  Will also restart spironolactone at 12.5 mg daily and increase clonidine to 0.2 mg bid to control blood pressure.  He will need to get metabolic panel in 2 weeks and has a follow up with Dr. Claiborne Billings in March.

## 2021-12-08 ENCOUNTER — Other Ambulatory Visit: Payer: Self-pay

## 2021-12-18 ENCOUNTER — Encounter: Payer: Self-pay | Admitting: Family Medicine

## 2021-12-18 ENCOUNTER — Ambulatory Visit (INDEPENDENT_AMBULATORY_CARE_PROVIDER_SITE_OTHER): Payer: Medicare Other | Admitting: Family Medicine

## 2021-12-18 ENCOUNTER — Other Ambulatory Visit: Payer: Self-pay

## 2021-12-18 VITALS — BP 134/83 | HR 85 | Temp 98.1°F | Resp 16 | Wt 319.6 lb

## 2021-12-18 DIAGNOSIS — Z6841 Body Mass Index (BMI) 40.0 and over, adult: Secondary | ICD-10-CM

## 2021-12-18 DIAGNOSIS — I10 Essential (primary) hypertension: Secondary | ICD-10-CM

## 2021-12-18 DIAGNOSIS — E119 Type 2 diabetes mellitus without complications: Secondary | ICD-10-CM | POA: Diagnosis not present

## 2021-12-18 DIAGNOSIS — E785 Hyperlipidemia, unspecified: Secondary | ICD-10-CM | POA: Diagnosis not present

## 2021-12-18 LAB — POCT GLYCOSYLATED HEMOGLOBIN (HGB A1C): Hemoglobin A1C: 5.8 % — AB (ref 4.0–5.6)

## 2021-12-18 NOTE — Progress Notes (Signed)
Patient is here for follow-up DMII. Patient would like to discuss his medication with provider.  Patient has no other concerns today for provider

## 2021-12-18 NOTE — Progress Notes (Signed)
Established Patient Office Visit  Subjective:  Patient ID: Nicholas Manor., male    DOB: 1966-07-18  Age: 56 y.o. MRN: [id number]  CC:  Chief Complaint  Patient presents with   Follow-up   Medication Refill   Diabetes    HPI Nicholas Kelly. presents for follow up of chronic med issues including diabetes and hypertension. Patient denies acute complaints or concerns.   Past Medical History:  Diagnosis Date   Acute CHF (congestive heart failure) ([place]) 02/10/2017   Dyspnea    Hypertension    Stroke [facility])     Past Surgical History:  Procedure Laterality Date   NO PAST SURGERIES     RIGHT/LEFT HEART CATH AND CORONARY ANGIOGRAPHY N/A 02/12/2017   Procedure: Right/Left Heart Cath and Coronary Angiography;  Surgeon: Belva Crome, MD;  Location: [facility];  Service: Cardiovascular;  Laterality: N/A;    Family History  Problem Relation Age of Onset   Hypertension Mother    Cardiomyopathy Mother    Stroke Maternal Aunt    Multiple sclerosis Sister     Social History   Socioeconomic History   Marital status: Divorced    Spouse name: Not on file   Number of children: 4   Years of education: Not on file   Highest education level: High school graduate  Occupational History   Not on file  Tobacco Use   Smoking status: Former    Packs/day: 1.00    Years: 34.00    Pack years: 34.00    Types: Cigarettes    Quit date: 06/28/2016    Years since quitting: 5.4   Smokeless tobacco: Never  Vaping Use   Vaping Use: Never used  Substance and Sexual Activity   Alcohol use: Not Currently    Comment: stopped in 2018   Drug use: Yes    Comment: 02/10/2017 "experimented w/drugs; haven't done any for years"   Sexual activity: Not Currently  Other Topics Concern   Not on file  Social History Narrative   Lives in one story home with daughter; Left handed; some college; caffeine - occasional coffee not daily; walks 2xweek   Social Determinants of Health    Financial Resource Strain: Medium Risk   Difficulty of Paying Living Expenses: Somewhat hard  Food Insecurity: No Food Insecurity   Worried About Charity fundraiser in the Last Year: Never true   [place] in the Last Year: Never true  Transportation Needs: No Transportation Needs   Lack of Transportation (Medical): No   Lack of Transportation (Non-Medical): No  Physical Activity: Not on file  Stress: Not on file  Social Connections: Not on file  Intimate Partner Violence: Not on file    ROS Review of Systems  All other systems reviewed and are negative.  Objective:   Today's Vitals: BP 134/83    Pulse 85    Temp 98.1 F (36.7 C) (Oral)    Resp 16    Wt (!) 319 lb 9.6 oz (145 kg)    SpO2 94%    BMI 48.60 kg/m   Physical Exam Vitals and nursing note reviewed.  Constitutional:      General: He is not in acute distress.    Appearance: He is obese.  Cardiovascular:     Rate and Rhythm: Normal rate and regular rhythm.  Pulmonary:     Effort: Pulmonary effort is normal.     Breath sounds: Normal breath sounds.  Abdominal:  Palpations: Abdomen is soft.     Tenderness: There is no abdominal tenderness.  Musculoskeletal:     Right lower leg: No edema.     Left lower leg: No edema.  Neurological:     General: No focal deficit present.     Mental Status: He is alert and oriented to person, place, and time.    Assessment & Plan:   1. Type 2 diabetes mellitus without complication, without long-term current use of insulin (HCC) A1c is slightly improved and now within normal limits. Continue present management and monitor - POCT glycosylated hemoglobin (Hb A1C)  2. Essential hypertension Appears stable with present management. Continue and monitor  3. Hyperlipidemia, unspecified hyperlipidemia type Continue present management and monitor  4. Class 3 severe obesity due to excess calories with serious comorbidity and body mass index (BMI) of 45.0 to 49.9 in adult  [facility]) Discussed dietary ad activity options. Patient goal is 3-5lbs/mo weight loss.     Outpatient Encounter Medications as of 12/18/2021  Medication Sig   aspirin 81 MG tablet Take 1 tablet (81 mg total) by mouth daily.   atorvastatin (LIPITOR) 80 MG tablet Take 1 tablet (80 mg total) by mouth daily at 6 PM.   Blood Glucose Monitoring Suppl (TRUE METRIX METER) w/Device KIT Use as instructed to check blood sugar BID.   carvedilol (COREG) 25 MG tablet Take 1 tablet (25 mg total) by mouth 2 (two) times daily with a meal.   cloNIDine (CATAPRES) 0.2 MG tablet Take 1 tablet (0.2 mg total) by mouth 2 (two) times daily.   empagliflozin (JARDIANCE) 10 MG TABS tablet TAKE 1 TABLET BY MOUTH ONCE DAILY BEFORE BREAKFAST   ezetimibe (ZETIA) 10 MG tablet Take 1 tablet (10 mg total) by mouth daily.   furosemide (LASIX) 80 MG tablet TAKE 1 TABLET BY MOUTH DAILY. (Patient taking differently: Take 80 mg by mouth daily.)   glucose blood (TRUE METRIX BLOOD GLUCOSE TEST) test strip use as directed   metFORMIN (GLUCOPHAGE) 1000 MG tablet Take 1 tablet (1,000 mg total) by mouth 2 (two) times daily with a meal. (Patient taking differently: Take 1,000 mg by mouth daily.)   ocrelizumab (OCREVUS) 300 MG/10ML injection Inject 600 mg into the vein once.   sacubitril-valsartan (ENTRESTO) 49-51 MG Take 1 tablet by mouth 2 (two) times daily. (Patient not taking: Reported on 12/01/2021)   spironolactone (ALDACTONE) 25 MG tablet Take 0.5 tablets (12.5 mg total) by mouth daily.   TRUEplus Lancets 28G MISC Use to check blood sugar twice daily.   No facility-administered encounter medications on file as of 12/18/2021.    Follow-up: No follow-ups on file.   Becky Sax, MD

## 2021-12-19 LAB — BASIC METABOLIC PANEL
BUN/Creatinine Ratio: 15 (ref 9–20)
BUN: 15 mg/dL (ref 6–24)
CO2: 23 mmol/L (ref 20–29)
Calcium: 9.6 mg/dL (ref 8.7–10.2)
Chloride: 104 mmol/L (ref 96–106)
Creatinine, Ser: 0.99 mg/dL (ref 0.76–1.27)
Glucose: 158 mg/dL — ABNORMAL HIGH (ref 70–99)
Potassium: 4.4 mmol/L (ref 3.5–5.2)
Sodium: 145 mmol/L — ABNORMAL HIGH (ref 134–144)
eGFR: 89 mL/min/{1.73_m2} (ref >59)

## 2021-12-26 ENCOUNTER — Other Ambulatory Visit: Payer: Self-pay

## 2021-12-31 ENCOUNTER — Ambulatory Visit: Payer: Medicaid Other | Admitting: Neurology

## 2022-01-19 ENCOUNTER — Ambulatory Visit: Payer: 59 | Admitting: Cardiovascular Disease

## 2022-02-03 ENCOUNTER — Other Ambulatory Visit: Payer: Self-pay

## 2022-02-10 ENCOUNTER — Other Ambulatory Visit: Payer: Self-pay

## 2022-02-10 NOTE — Progress Notes (Signed)
? ?NEUROLOGY FOLLOW UP OFFICE NOTE ? ?[name] ?[id number] ? ?Assessment/Plan:  ? ?Multiple sclerosis ?Cervical myelopathy - asymptomatic.  Neurosurgery defers intervention at this time. ?Hypertension ?  ?1  DMT:  Ocrevus ?2. D3 4000 IU daily ?3.  Check CBC w/diff, CMP, vit D and quantitative immunoglobulin panel ?4. Advised to go home now and take his blood pressure medication and follow up with PCP if needed. ?4.  Follow up 6 months. ?  ?Subjective:  ?Nicholas Kelly is a 56 year old left-handed black man with non-ischemic cardiomyopathy, hypertension and history of stroke who follows up for multiple sclerosis.  MRIs from May personally reviewed. ?  ?UPDATE: ?Current DMT:  Ocrevus.  ?Other medications:  D3 4000 IU daily ?  ?He is feeling well.  Blood pressure elevated.  He didn't take his medication this morning because he doesn't take it if he is out of the house because it makes him use the bathroom. ? ?  ?Vision:  No issues ?Motor:  Left greater than right legs feel heavy as the day progresses.  Upper extremities feel fine.   ?Sensory:  No issues ?Pain:  No issues ?Gait:  No issues ?Bowel/Bladder:  Decreased bowel movements. ?Fatigue:  No issues ?Cognition:  No issues ?Mood:  No issues ?  ?HISTORY:  ?In 2007, he was working at [employer] and he tripped over something and fell forward landing on his arms.  After he fell, his arms were contracted.  He was told it was due to trauma and symptoms resolved after a month. ?  ?He was admitted to [facility] on 07/17/17 for diplopia and disequilibrium.  Exam revealed disconjugate gaze, with bilateral horizontal gaze palsy with inward deviation.  CT of head showed old right parietal infarct.  MRI of brain showed encephalomalacia in the bilateral pons and right parietal lobe but no acute findings.  Findings in the pons was determined to represent the cause of symptoms, now chronic due to late presentation.  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   ?  ?In August 2020, he woke up with left arm weakness and arm and hand were slight contracted.  He also noticed that his speech was not normal.  He noted some mild numbness in the left arm as well.  No associated headache, visual disturbance.  He presented to the ED about a week later on 06/15/19 for further evaluation.  CT of brain showed atrophy and chronic small vessel ischemic changes but no acute intracranial abnormality.   MRI of brain without contrast, which was limited due to motion artifact, showed pathy confluent T2/FLAIR signal abnormality involving the periventricular cerebral white matter and pons.  Concern that this may represent demyelinating disease.  No associated restricted diffusion to suggest active demyelination.  MRI of cervical spine without contrast, also limited due to motion artifact, showed patchy cord signal abnormality involving the upper cervical spinal cord at level of C2-3 and C3-4 with no obvious cord expansion to suggest active demyelinating disease.  Also demonstrated, multilevel cervical spondylosis with diffuse mild spinal stenosis at C2-C3 through C5-C6, mild right C4 foraminal narrowing and mild to moderate left C5 foraminal narrowing and mild bilateral C7 foraminal narrowing.  Neurologic exam in the ED revealed questionable mild dysarthria but otherwise no focal or lateralizing findings.  MRI was reviewed by the neurohospitalist who questioned that findings represented a new demyelinating process.  He was discharged with outpatient neurology follow up. MRI of  brain and cervical spine from 09/19/2019 showed cerebral white matter disease suggestive of MS and probable underlying chronic small vessel ischemic changes.  Cervical spine showed hyperintensity and cord mass effect at the C2-3 and C3-4 levels with associated degenerative spinal stenosis rather than MS lesions.  He was referred to neurosurgery.   Because he was asymptomatic, surgical  intervention was not recommended.  He underwent LP on 09/29/2019 where CSF analysis demonstrated over 5 oligoclonal bands and IgG index 1.82. ?  ?Labs: ?09/29/2019 Lumbar Puncture:  CSF cell count 28/96% lymphs, culture with moderate WBCs but no organisms, glucose 75, protein 55, myelin basic protein <2, >5 oligoclonal bands, IgG index 1.82, cytology no malignant cells ?10/11/2019 LABS:  JCV antibody positive with index of 1.66. ?  ?  ?Imaging: ?03/03/2021 MRI BRAIN W WO:  Stable exam since November of 2020. Combination of findings likely representing the presence of chronic demyelinating disease, chronic small-vessel disease, and old cortical infarction in the right frontoparietal junction region. ?03/12/2021 MRI C-SPINE W WO:  1. Similar short-segment dorsal cord signal abnormality at C2/C3, ?which could relate to prior demyelination versus compressive myelopathy given degenerative cord mass effect at these levels. No definite new cord lesion or cord enhancement on this motion limited exam.  2. Similar multilevel degenerative change ?03/12/2021 MRI T-SPINE W WO:  1. No convincing cord signal abnormality or enhancement on this ?mildly motion limited exam. 2. Left paracentral disc protrusion at T9-T10 contacts and flattens the left ventral cord and narrows the left root entry zone with ?overall mild canal stenosis. Additional small posterior disc bulges at T6-T7, T7-T8 and T8-T9 which partially efface ventral CSF.  3. Moderate left foraminal stenosis at T8-T9, T9-T10, T10-T11, and T11-T12 with additional multilevel mild foraminal stenosis. 4. Small right pleural effusion with rounded 1.1 cm opacity in the right lower lobe (series 20, image 35), suboptimally evaluated on ?this study. This may represent consolidation or atelectasis, but pulmonary nodule is not excluded and CT of the chest is recommended to further characterize (particularly given the patient's smoking history). 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  ?Her sister, who has passed away, had multiple sclerosis. ? ?PAST MEDICAL HISTORY: ?Past Medical History:  ?Diagnosis Date  ? Acute CHF (congestive heart failure) ([place]) 02/10/2017  ? Dyspnea   ? Hypertension   ? Stroke [facility])   ? ? ?MEDICATIONS: ?Current Outpatient Medications on File Prior to Visit  ?Medication Sig Dispense Refill  ? aspirin 81 MG tablet Take 1 tablet (81 mg total) by mouth daily. 30 tablet 3  ? atorvastatin (LIPITOR) 80 MG tablet Take 1 tablet (80 mg total) by mouth daily at 6 PM. 90 tablet 1  ? Blood Glucose Monitoring Suppl (TRUE METRIX METER) w/Device KIT Use as instructed to check blood sugar BID. 1 kit 0  ? carvedilol (COREG) 25 MG tablet Take 1 tablet (25 mg total) by mouth 2 (two) times daily with a meal. 180 tablet 1  ? cloNIDine (CATAPRES) 0.2 MG tablet Take 1 tablet (0.2 mg total) by mouth 2 (two) times daily. 180 tablet  1  ? empagliflozin (JARDIANCE) 10 MG TABS tablet TAKE 1 TABLET BY MOUTH ONCE DAILY BEFORE BREAKFAST 90 tablet 0  ? ezetimibe (ZETIA) 10 MG tablet Take 1 tablet (10 mg total) by mouth daily. 90 tablet 1  ? furosemide (LASIX) 80 MG tablet TAKE 1 TABLET BY MOUTH DAILY. (Patient taking differently: Take 80 mg by mouth daily.) 90 tablet 1  ? glucose blood (TRUE METRIX BLOOD GLUCOSE TEST) test strip use as directed 100 strip 12  ? metFORMIN (GLUCOPHAGE) 1000 MG tablet Take 1 tablet (1,000 mg total) by mouth 2 (two) times daily with a meal.  (Patient taking differently: Take 1,000 mg by mouth daily.) 180 tablet 1  ? ocrelizumab (OCREVUS) 300 MG/10ML injection Inject 600 mg into the vein once.    ? sacubitril-valsartan (ENTRESTO) 49-51 MG Take

## 2022-02-11 ENCOUNTER — Other Ambulatory Visit (INDEPENDENT_AMBULATORY_CARE_PROVIDER_SITE_OTHER): Payer: Medicare Other

## 2022-02-11 ENCOUNTER — Ambulatory Visit (INDEPENDENT_AMBULATORY_CARE_PROVIDER_SITE_OTHER): Payer: Medicare Other | Admitting: Neurology

## 2022-02-11 ENCOUNTER — Encounter: Payer: Self-pay | Admitting: Neurology

## 2022-02-11 VITALS — BP 155/92 | HR 58 | Ht 68.0 in | Wt 322.0 lb

## 2022-02-11 DIAGNOSIS — G35 Multiple sclerosis: Secondary | ICD-10-CM

## 2022-02-11 DIAGNOSIS — Z79899 Other long term (current) drug therapy: Secondary | ICD-10-CM | POA: Diagnosis not present

## 2022-02-11 DIAGNOSIS — I10 Essential (primary) hypertension: Secondary | ICD-10-CM

## 2022-02-11 DIAGNOSIS — G959 Disease of spinal cord, unspecified: Secondary | ICD-10-CM | POA: Diagnosis not present

## 2022-02-11 LAB — CBC WITH DIFFERENTIAL/PLATELET
Basophils Absolute: 0 10*3/uL (ref 0.0–0.1)
Basophils Relative: 0.6 % (ref 0.0–3.0)
Eosinophils Absolute: 0.4 10*3/uL (ref 0.0–0.7)
Eosinophils Relative: 5.6 % — ABNORMAL HIGH (ref 0.0–5.0)
HCT: 39.1 % (ref 39.0–52.0)
Hemoglobin: 12.8 g/dL — ABNORMAL LOW (ref 13.0–17.0)
Lymphocytes Relative: 22.8 % (ref 12.0–46.0)
Lymphs Abs: 1.8 10*3/uL (ref 0.7–4.0)
MCHC: 32.7 g/dL (ref 30.0–36.0)
MCV: 95.2 fl (ref 78.0–100.0)
Monocytes Absolute: 1.1 10*3/uL — ABNORMAL HIGH (ref 0.1–1.0)
Monocytes Relative: 13.8 % — ABNORMAL HIGH (ref 3.0–12.0)
Neutro Abs: 4.4 10*3/uL (ref 1.4–7.7)
Neutrophils Relative %: 57.2 % (ref 43.0–77.0)
Platelets: 260 10*3/uL (ref 150.0–400.0)
RBC: 4.11 Mil/uL — ABNORMAL LOW (ref 4.22–5.81)
RDW: 14.2 % (ref 11.5–15.5)
WBC: 7.7 10*3/uL (ref 4.0–10.5)

## 2022-02-11 LAB — COMPREHENSIVE METABOLIC PANEL
ALT: 13 U/L (ref 0–53)
AST: 11 U/L (ref 0–37)
Albumin: 4.1 g/dL (ref 3.5–5.2)
Alkaline Phosphatase: 61 U/L (ref 39–117)
BUN: 16 mg/dL (ref 6–23)
CO2: 29 mEq/L (ref 19–32)
Calcium: 8.9 mg/dL (ref 8.4–10.5)
Chloride: 105 mEq/L (ref 96–112)
Creatinine, Ser: 0.9 mg/dL (ref 0.40–1.50)
GFR: 94.99 mL/min (ref >60.00)
Glucose, Bld: 103 mg/dL — ABNORMAL HIGH (ref 70–99)
Potassium: 4.3 mEq/L (ref 3.5–5.1)
Sodium: 142 mEq/L (ref 135–145)
Total Bilirubin: 0.6 mg/dL (ref 0.2–1.2)
Total Protein: 7.3 g/dL (ref 6.0–8.3)

## 2022-02-11 LAB — VITAMIN D 25 HYDROXY (VIT D DEFICIENCY, FRACTURES): VITD: 12.61 ng/mL — ABNORMAL LOW (ref 30.00–100.00)

## 2022-02-11 NOTE — Patient Instructions (Addendum)
Continue Ocrevus ?D3 4000 IU daily ?Check CBC with diff, CMP, IgG/IgA/IgM, vitamin D today and again in 6 months. ?Take your medications and if blood pressure not improved, call PCP ?Follow up 6 months. ?Your provider has requested that you have labwork completed today. The lab is located on the Second floor at [place], within the [facility] Endocrinology office. When you get off the elevator, turn right and go in the [facility] Endocrinology [address]; the first brown door on the left.  Tell the ladies behind the desk that you are there for lab work. If you are not called within 15 minutes please check with the front desk.  ? ?Once you complete your labs you are free to go. You will receive a call or message via MyChart with your lab results.    ?

## 2022-02-13 ENCOUNTER — Telehealth: Payer: Self-pay

## 2022-02-13 ENCOUNTER — Other Ambulatory Visit: Payer: Self-pay

## 2022-02-13 LAB — IGG, IGA, IGM
IgG (Immunoglobin G), Serum: 1638 mg/dL (ref 600–1640)
IgM, Serum: 27 mg/dL — ABNORMAL LOW (ref 50–300)
Immunoglobulin A: 414 mg/dL — ABNORMAL HIGH (ref 47–310)

## 2022-02-13 MED ORDER — VITAMIN D3 1.25 MG (50000 UT) PO TABS
50000.0000 [IU] | ORAL_TABLET | ORAL | 5 refills | Status: AC
  Filled 2022-02-13: qty 4, fill #0

## 2022-02-13 NOTE — Telephone Encounter (Signed)
-----   Message from Drema Dallas, DO sent at 02/13/2022  6:43 AM EDT ----- ?Patient's vitamin D level is very low, which makes me suspect that he is not taking vitamin D.  Please contact patient with plan and send to his pharmacy D3 50,000 units once a week (quantity 4) with 5 refills.   ?

## 2022-02-13 NOTE — Telephone Encounter (Signed)
Patient called back.  I let him know the script was sent to the pharmacy. ?

## 2022-02-13 NOTE — Telephone Encounter (Signed)
Tried calling pt no answer. Unable to LVM. ? ?Script Vitamin D3 50000 units sent to the pharmacy.  ?

## 2022-03-01 DIAGNOSIS — G4733 Obstructive sleep apnea (adult) (pediatric): Secondary | ICD-10-CM | POA: Diagnosis not present

## 2022-03-02 ENCOUNTER — Other Ambulatory Visit: Payer: Self-pay

## 2022-03-02 ENCOUNTER — Telehealth: Payer: Self-pay

## 2022-03-02 MED ORDER — ERGOCALCIFEROL 1.25 MG (50000 UT) PO CAPS
50000.0000 [IU] | ORAL_CAPSULE | ORAL | 5 refills | Status: DC
  Filled 2022-03-02: qty 4, 28d supply, fill #0
  Filled 2022-04-24: qty 4, 28d supply, fill #1
  Filled 2022-06-11: qty 4, 28d supply, fill #2
  Filled 2022-07-28: qty 4, 28d supply, fill #3
  Filled 2022-09-28 – 2022-10-21 (×2): qty 4, 28d supply, fill #4
  Filled 2023-01-08: qty 4, 28d supply, fill #5

## 2022-03-02 NOTE — Telephone Encounter (Signed)
Per patient pharmacy please order Vitamin D2, They do not offer or have D3, ? ?Per Dr.Jaffe order Vitamin D2 50000 ?

## 2022-04-01 DIAGNOSIS — G4733 Obstructive sleep apnea (adult) (pediatric): Secondary | ICD-10-CM | POA: Diagnosis not present

## 2022-04-24 ENCOUNTER — Other Ambulatory Visit: Payer: Self-pay

## 2022-04-24 ENCOUNTER — Other Ambulatory Visit: Payer: Self-pay | Admitting: Cardiovascular Disease

## 2022-04-24 DIAGNOSIS — E785 Hyperlipidemia, unspecified: Secondary | ICD-10-CM

## 2022-04-24 MED ORDER — EZETIMIBE 10 MG PO TABS
10.0000 mg | ORAL_TABLET | Freq: Every day | ORAL | 1 refills | Status: DC
  Filled 2022-04-24: qty 90, 90d supply, fill #0
  Filled 2022-07-28: qty 90, 90d supply, fill #1

## 2022-04-29 ENCOUNTER — Other Ambulatory Visit: Payer: Self-pay

## 2022-05-01 DIAGNOSIS — G4733 Obstructive sleep apnea (adult) (pediatric): Secondary | ICD-10-CM | POA: Diagnosis not present

## 2022-05-20 ENCOUNTER — Ambulatory Visit: Payer: 59 | Admitting: Cardiovascular Disease

## 2022-05-22 ENCOUNTER — Other Ambulatory Visit: Payer: Self-pay

## 2022-05-25 ENCOUNTER — Encounter (HOSPITAL_COMMUNITY): Payer: Medicare Other

## 2022-05-28 ENCOUNTER — Encounter (HOSPITAL_COMMUNITY): Payer: Medicare Other

## 2022-05-29 ENCOUNTER — Non-Acute Institutional Stay (HOSPITAL_COMMUNITY)
Admission: RE | Admit: 2022-05-29 | Discharge: 2022-05-29 | Disposition: A | Discharge status: Home / Self Care | Payer: Medicare Other | Source: Ambulatory Visit | Attending: Internal Medicine | Admitting: Internal Medicine

## 2022-05-29 DIAGNOSIS — G35 Multiple sclerosis: Secondary | ICD-10-CM | POA: Diagnosis not present

## 2022-05-29 MED ORDER — METHYLPREDNISOLONE SODIUM SUCC 125 MG IJ SOLR
100.0000 mg | Freq: Once | INTRAMUSCULAR | Status: AC
  Administered 2022-05-29: 100 mg via INTRAVENOUS
  Filled 2022-05-29: qty 2

## 2022-05-29 MED ORDER — FAMOTIDINE IN NACL 20-0.9 MG/50ML-% IV SOLN
20.0000 mg | Freq: Once | INTRAVENOUS | Status: AC
  Administered 2022-05-29: 20 mg via INTRAVENOUS
  Filled 2022-05-29: qty 50

## 2022-05-29 MED ORDER — SODIUM CHLORIDE 0.9 % IV SOLN: INTRAVENOUS | Status: DC | PRN

## 2022-05-29 MED ORDER — DIPHENHYDRAMINE HCL 50 MG/ML IJ SOLN
25.0000 mg | Freq: Once | INTRAMUSCULAR | Status: AC
  Administered 2022-05-29: 25 mg via INTRAVENOUS
  Filled 2022-05-29: qty 1

## 2022-05-29 MED ORDER — SODIUM CHLORIDE 0.9 % IV SOLN
600.0000 mg | INTRAVENOUS | Status: DC
  Administered 2022-05-29: 600 mg via INTRAVENOUS
  Filled 2022-05-29: qty 20

## 2022-05-29 MED ORDER — ACETAMINOPHEN 325 MG PO TABS
650.0000 mg | ORAL_TABLET | Freq: Once | ORAL | Status: AC
  Administered 2022-05-29: 650 mg via ORAL
  Filled 2022-05-29: qty 2

## 2022-05-29 NOTE — Progress Notes (Signed)
Patient received IV Ocrevus. Declined to wait for the 60 minutes post infusion observation instead waited for about 30 minutes.Tolerated well, vitals stable, discharge instructions given, verbalized understanding. Patient alert, oriented and ambulatory at the time of discharge.

## 2022-05-29 NOTE — Progress Notes (Signed)
PATIENT CARE CENTER NOTE:  Diagnosis:  Multiple Sclerosis G35  Provider: Shon Millet DO  Procedure: Ocrevus 600mg  infusion   Patient received IV Ocrevus via PIV. Pre infusion medications - Tylenol PO,Benadryl IV,  Solu-medrol IV and Pepcid IV were given. Medication was titrated per protocol.  Pt tolerating infusion well, no adverse reactions noted as of final titration, handoff given to [place] RN to complete infusion.  Pt to RTC in 6 months, instructed to scheduled next infusion at front desk prior to leaving, pt verbalized understanding.

## 2022-06-01 ENCOUNTER — Telehealth: Payer: Self-pay

## 2022-06-01 DIAGNOSIS — G4733 Obstructive sleep apnea (adult) (pediatric): Secondary | ICD-10-CM | POA: Diagnosis not present

## 2022-06-01 DIAGNOSIS — H2513 Age-related nuclear cataract, bilateral: Secondary | ICD-10-CM | POA: Diagnosis not present

## 2022-06-01 DIAGNOSIS — H40033 Anatomical narrow angle, bilateral: Secondary | ICD-10-CM | POA: Diagnosis not present

## 2022-06-01 NOTE — Telephone Encounter (Signed)
Tried calling patient. Handicap parking form received today.  Dr.Jaffe is out of the office this week but once he returns he will look over the chart and fill out form.

## 2022-06-01 NOTE — Telephone Encounter (Signed)
Patient called back

## 2022-06-11 ENCOUNTER — Other Ambulatory Visit: Payer: Self-pay

## 2022-06-11 ENCOUNTER — Other Ambulatory Visit: Payer: Self-pay | Admitting: Cardiovascular Disease

## 2022-06-11 DIAGNOSIS — I5042 Chronic combined systolic (congestive) and diastolic (congestive) heart failure: Secondary | ICD-10-CM

## 2022-06-11 DIAGNOSIS — E1165 Type 2 diabetes mellitus with hyperglycemia: Secondary | ICD-10-CM

## 2022-06-11 DIAGNOSIS — E785 Hyperlipidemia, unspecified: Secondary | ICD-10-CM

## 2022-06-11 DIAGNOSIS — E119 Type 2 diabetes mellitus without complications: Secondary | ICD-10-CM

## 2022-06-11 MED ORDER — METFORMIN HCL 1000 MG PO TABS
1000.0000 mg | ORAL_TABLET | Freq: Two times a day (BID) | ORAL | 1 refills | Status: DC
  Filled 2022-06-11: qty 180, 90d supply, fill #0
  Filled 2023-01-12 – 2023-02-10 (×4): qty 180, 90d supply, fill #1

## 2022-06-11 MED ORDER — EMPAGLIFLOZIN 10 MG PO TABS
ORAL_TABLET | Freq: Every day | ORAL | 0 refills | Status: DC
  Filled 2022-06-11: qty 30, 30d supply, fill #0

## 2022-06-11 MED ORDER — ATORVASTATIN CALCIUM 80 MG PO TABS
80.0000 mg | ORAL_TABLET | Freq: Every day | ORAL | 1 refills | Status: DC
  Filled 2022-06-11 – 2023-01-05 (×2): qty 90, 90d supply, fill #0
  Filled 2023-01-05: qty 90, 90d supply, fill #1

## 2022-06-11 MED ORDER — CARVEDILOL 25 MG PO TABS
25.0000 mg | ORAL_TABLET | Freq: Two times a day (BID) | ORAL | 1 refills | Status: DC
  Filled 2022-06-11: qty 180, 90d supply, fill #0
  Filled 2022-08-13 – 2022-10-21 (×2): qty 180, 90d supply, fill #1

## 2022-06-11 NOTE — Telephone Encounter (Signed)
Nicholas Kelly the handicap parking pww that was signed for patient to pick up.

## 2022-06-11 NOTE — Telephone Encounter (Signed)
Pt came in wanting to check and see if his handicap parking form had been filled out yet? Was not up front.

## 2022-06-15 ENCOUNTER — Ambulatory Visit: Payer: Medicare Other | Admitting: Family Medicine

## 2022-06-18 ENCOUNTER — Encounter: Payer: Self-pay | Admitting: Family Medicine

## 2022-06-18 ENCOUNTER — Ambulatory Visit (INDEPENDENT_AMBULATORY_CARE_PROVIDER_SITE_OTHER): Payer: Medicare Other | Admitting: Family Medicine

## 2022-06-18 VITALS — BP 128/78 | HR 70 | Temp 97.7°F | Resp 16 | Ht 68.0 in | Wt 303.0 lb

## 2022-06-18 DIAGNOSIS — I10 Essential (primary) hypertension: Secondary | ICD-10-CM | POA: Diagnosis not present

## 2022-06-18 DIAGNOSIS — Z6841 Body Mass Index (BMI) 40.0 and over, adult: Secondary | ICD-10-CM

## 2022-06-18 DIAGNOSIS — E119 Type 2 diabetes mellitus without complications: Secondary | ICD-10-CM | POA: Diagnosis not present

## 2022-06-18 DIAGNOSIS — E785 Hyperlipidemia, unspecified: Secondary | ICD-10-CM

## 2022-06-18 NOTE — Progress Notes (Signed)
Established Patient Office Visit  Subjective    Patient ID: Nicholas Bangura., male    DOB: [birth year]  Age: 56 y.o. MRN: [id number]  CC:  Chief Complaint  Patient presents with   Follow-up   Diabetes    HPI Nicholas Crossland. presents for routine follow up of chronic med issues including diabetes and hypertension. Denies acute complaints or concerns.    Outpatient Encounter Medications as of 06/18/2022  Medication Sig   aspirin 81 MG tablet Take 1 tablet (81 mg total) by mouth daily.   atorvastatin (LIPITOR) 80 MG tablet Take 1 tablet (80 mg total) by mouth daily at 6 PM.   Blood Glucose Monitoring Suppl (TRUE METRIX METER) w/Device KIT Use as instructed to check blood sugar BID.   carvedilol (COREG) 25 MG tablet Take 1 tablet (25 mg total) by mouth 2 (two) times daily with a meal.   Cholecalciferol (VITAMIN D3) 1.25 MG (50000 UT) TABS Take 50,000 Units by mouth once a week.   cloNIDine (CATAPRES) 0.2 MG tablet Take 1 tablet (0.2 mg total) by mouth 2 (two) times daily.   empagliflozin (JARDIANCE) 10 MG TABS tablet TAKE 1 TABLET BY MOUTH ONCE DAILY BEFORE BREAKFAST   ergocalciferol (VITAMIN D2) 1.25 MG (50000 UT) capsule Take 1 capsule (50,000 Units total) by mouth once a week.   ezetimibe (ZETIA) 10 MG tablet Take 1 tablet (10 mg total) by mouth daily.   furosemide (LASIX) 80 MG tablet TAKE 1 TABLET BY MOUTH DAILY. (Patient taking differently: Take 80 mg by mouth daily.)   glucose blood (TRUE METRIX BLOOD GLUCOSE TEST) test strip use as directed   metFORMIN (GLUCOPHAGE) 1000 MG tablet Take 1 tablet (1,000 mg total) by mouth 2 (two) times daily with a meal.   ocrelizumab (OCREVUS) 300 MG/10ML injection Inject 600 mg into the vein once.   sacubitril-valsartan (ENTRESTO) 49-51 MG Take 1 tablet by mouth 2 (two) times daily.   spironolactone (ALDACTONE) 25 MG tablet Take 0.5 tablets (12.5 mg total) by mouth daily.   TRUEplus Lancets 28G MISC Use to check blood sugar twice  daily.   No facility-administered encounter medications on file as of 06/18/2022.    Past Medical History:  Diagnosis Date   Acute CHF (congestive heart failure) ([place]) 02/10/2017   Dyspnea    Hypertension    Stroke [facility])     Past Surgical History:  Procedure Laterality Date   NO PAST SURGERIES     RIGHT/LEFT HEART CATH AND CORONARY ANGIOGRAPHY N/A 02/12/2017   Procedure: Right/Left Heart Cath and Coronary Angiography;  Surgeon: Belva Crome, MD;  Location: [facility];  Service: Cardiovascular;  Laterality: N/A;    Family History  Problem Relation Age of Onset   Hypertension Mother    Cardiomyopathy Mother    Stroke Maternal Aunt    Multiple sclerosis Sister     Social History   Socioeconomic History   Marital status: Divorced    Spouse name: Not on file   Number of children: 4   Years of education: Not on file   Highest education level: High school graduate  Occupational History   Not on file  Tobacco Use   Smoking status: Former    Packs/day: 1.00    Years: 34.00    Total pack years: 34.00    Types: Cigarettes    Quit date: 06/28/2016    Years since quitting: 5.9   Smokeless tobacco: Never  Vaping Use   Vaping Use:  Never used  Substance and Sexual Activity   Alcohol use: Not Currently    Comment: stopped in 2018   Drug use: Yes    Comment: 02/10/2017 "experimented w/drugs; haven't done any for years"   Sexual activity: Not Currently  Other Topics Concern   Not on file  Social History Narrative   Lives in one story home with daughter; Left handed; some college; caffeine - occasional coffee not daily; walks 2xweek   Social Determinants of Health   Financial Resource Strain: Medium Risk (11/03/2021)   Overall Financial Resource Strain (CARDIA)    Difficulty of Paying Living Expenses: Somewhat hard  Food Insecurity: No Food Insecurity (11/03/2021)   Hunger Vital Sign    Worried About Running Out of Food in the Last Year: Never true    Ran Out of Food  in the Last Year: Never true  Transportation Needs: No Transportation Needs (11/03/2021)   PRAPARE - Hydrologist (Medical): No    Lack of Transportation (Non-Medical): No  Physical Activity: Not on file  Stress: Not on file  Social Connections: Not on file  Intimate Partner Violence: Not on file    Review of Systems  All other systems reviewed and are negative.       Objective    BP 128/78   Pulse 70   Temp 97.7 F (36.5 C) (Oral)   Resp 16   Ht _0  (1.727 m)   Wt (!) 303 lb (137.4 kg)   SpO2 95%   BMI 46.07 kg/m   Physical Exam Vitals and nursing note reviewed.  Constitutional:      General: He is not in acute distress.    Appearance: He is obese.  Cardiovascular:     Rate and Rhythm: Normal rate and regular rhythm.  Pulmonary:     Effort: Pulmonary effort is normal.     Breath sounds: Normal breath sounds.  Abdominal:     Palpations: Abdomen is soft.     Tenderness: There is no abdominal tenderness.  Musculoskeletal:     Right lower leg: No edema.     Left lower leg: No edema.  Neurological:     General: No focal deficit present.     Mental Status: He is alert and oriented to person, place, and time.         Assessment & Plan:   1. Type 2 diabetes mellitus without complication, without long-term current use of insulin (HCC) A1c is stable and at goal. Continue and monitor - Hemoglobin A1c  2. Essential hypertension Appears stable with present management. Continue and monitor  3. Hyperlipidemia, unspecified hyperlipidemia type continue  4. Class 3 severe obesity due to excess calories with serious comorbidity and body mass index (BMI) of 45.0 to 49.9 in adult [facility]) Discussed dietary and activity options.    No follow-ups on file.   Becky Sax, MD

## 2022-06-19 ENCOUNTER — Other Ambulatory Visit: Payer: Self-pay

## 2022-06-19 ENCOUNTER — Encounter: Payer: Self-pay | Admitting: Cardiovascular Disease

## 2022-06-19 ENCOUNTER — Encounter: Payer: Self-pay | Admitting: Family Medicine

## 2022-06-19 ENCOUNTER — Telehealth: Payer: Self-pay | Admitting: Pharmacist

## 2022-06-19 ENCOUNTER — Ambulatory Visit: Payer: Medicare Other | Admitting: Cardiovascular Disease

## 2022-06-19 DIAGNOSIS — G4733 Obstructive sleep apnea (adult) (pediatric): Secondary | ICD-10-CM

## 2022-06-19 DIAGNOSIS — I428 Other cardiomyopathies: Secondary | ICD-10-CM

## 2022-06-19 DIAGNOSIS — E785 Hyperlipidemia, unspecified: Secondary | ICD-10-CM | POA: Diagnosis not present

## 2022-06-19 DIAGNOSIS — E119 Type 2 diabetes mellitus without complications: Secondary | ICD-10-CM

## 2022-06-19 DIAGNOSIS — I5042 Chronic combined systolic (congestive) and diastolic (congestive) heart failure: Secondary | ICD-10-CM

## 2022-06-19 LAB — HEMOGLOBIN A1C
Est. average glucose Bld gHb Est-mCnc: 117 mg/dL
Hgb A1c MFr Bld: 5.7 % — ABNORMAL HIGH (ref 4.8–5.6)

## 2022-06-19 MED ORDER — ENTRESTO 49-51 MG PO TABS: 1.0000 | ORAL_TABLET | Freq: Two times a day (BID) | ORAL | 11 refills | Status: DC

## 2022-06-19 MED ORDER — ENTRESTO 49-51 MG PO TABS
1.0000 | ORAL_TABLET | Freq: Two times a day (BID) | ORAL | 11 refills | Status: DC
  Filled 2022-06-19: qty 60, 30d supply, fill #0
  Filled 2022-08-26: qty 60, 30d supply, fill #1
  Filled 2022-09-28 – 2022-10-21 (×2): qty 60, 30d supply, fill #2
  Filled 2023-01-05: qty 60, 30d supply, fill #0
  Filled 2023-01-05: qty 60, 30d supply, fill #3
  Filled 2023-01-28: qty 60, 30d supply, fill #1
  Filled 2023-02-26: qty 60, 30d supply, fill #2
  Filled 2023-03-29 – 2023-04-28 (×3): qty 60, 30d supply, fill #3

## 2022-06-19 MED ORDER — EMPAGLIFLOZIN 10 MG PO TABS
ORAL_TABLET | Freq: Every day | ORAL | 3 refills | Status: AC
  Filled 2022-06-19: qty 90, fill #0
  Filled 2022-07-28: qty 90, 90d supply, fill #0
  Filled 2022-09-25: qty 30, 30d supply, fill #0

## 2022-06-19 NOTE — Telephone Encounter (Signed)
In room consult per Dr Tresa Endo.  Confusion regarding patient's insurance plans.  Patient showed me his card. He now has [employer] and Medicaid.  Sent in Rx for [employer] and Jardiance to [facility]

## 2022-06-19 NOTE — Patient Instructions (Addendum)
Medication Instructions:   Restart taking Entresto 49/51 mg twice a day     *If you need a refill on your cardiac medications before your next appointment, please call your pharmacy*   Lab Work: Not needed If you have labs (blood work) drawn today and your tests are completely normal, you will receive your results only by: MyChart Message (if you have MyChart) OR A paper copy in the mail If you have any lab test that is abnormal or we need to change your treatment, we will call you to review the results.   Testing/Procedures: Will be schedule in 3 to 4 months  after being  on Entresto - [address] Your physician has requested that you have an echocardiogram. Echocardiography is a painless test that uses sound waves to create images of your heart. It provides your doctor with information about the size and shape of your heart and how well your heart's chambers and valves are working. This procedure takes approximately one hour. There are no restrictions for this procedure.   Follow-Up: At [facility], you and your health needs are our priority.  As part of our continuing mission to provide you with exceptional heart care, we have created designated Provider Care Teams.  These Care Teams include your primary Cardiologist (physician) and Advanced Practice Providers (APPs -  Physician Assistants and Nurse Practitioners) who all work together to provide you with the care you need, when you need it.     Your next appointment:   5 month(s)  The format for your next appointment:   In Person  Provider:   Nicki Guadalajara, MD    Other Instructions   pressure changes were done to your C-PAP - auto 13 to 18

## 2022-06-19 NOTE — Progress Notes (Unsigned)
Cardiology Office Note    Date:  06/20/2022   ID:  Nicholas Wuebker., DOB [birth year], MRN [id number]  PCP:  [name], MD  Cardiologist:  [name], MD   8 month follow-up visit.  History of Present Illness:  Nicholas Shere. is a 56 y.o. male who presents for a 8 month follow-up cardiology evaluation.  Nicholas Kelly has a history of hypertension, and presented to [facility] hospital on 02/10/2017 from urgent care with acute onset of shortness of breath.  He was previously diagnosed with CHF in October 17 and had been on HCTZ but had run out of this prescription.  He was admitted by the hospitalist service.  A chest x-ray showed findings of CHF with pulmonary edema.  BNP was elevated at 752.  An echo Doppler study showed reduced LV function with an EF of 20-25% with moderate concentric LVH, diffuse hypokinesis, grade 3 diastolic dysfunction, mild mitral regurgitation, moderate tricuspid regurgitation, and moderate bony hypertension with a PA pressure 51 mm.  Patient was seen in cardiology consultation.  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  He did not have significant obstructive CAD with only diffuse 30-50% mid and distal RCA stenoses.  There is moderate pulmonary hypertension.  He was discharged on 02/14/2017 and has been on lisinopril 5 mg, furosemide 40 mg, carvedilol 6.25 mg twice a day in addition to aspirin 81 mg, supplemental potassium 20, now quit once and atorvastatin 40 mg.    I saw for initial post hospital evaluation on 03/11/2017.  I reviewed his hospital data in its entirety.  He was feeling improved on his initial medical regimen and I recommended further titration of his lisinopril up to 10 mg and increase carvedilol to 12.5 mg twice a day.  He did not have insurance and ws working on trying to obtain a Medicaid  card.  We talked in the future about possibly switching him to entresto.  On his increase regimen, he has felt improved.    When I saw him in June 2018 his blood pressure continued to be elevated and I recommended the addition of spironolactone at 12.5 mg for one week and then to increase this to 12.5 mg twice a day.  I also titrated lisinopril from 10 mg to 15 mg. On July 17, 2017 he presented to the hospital with complaints of diplopia and visual disturbance. 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  An MRI of the range, did not show acute abnormality but revealed chronic ischemic microangiopathy.  He had a negative CTA, except for minimal calcified atherosclerosis involving only the carotids.  During his hospitalization.  A repeat echo Doppler study showed an EF of 30% with grade 1 diastolic dysfunction.  His aortic root was mildly dilated.  Neurology was consulted and he was felt to have had a likely CVA given his reduced LV function.  He underwent outpatient neurologic rehab.  When I saw him in October 2018 blood pressure was still elevated and I further titrated lisinopril to 20 mg.  He was also taking Spironolactone 12.5 mg twice a day, furosemide 40 mg daily, carvedilol 12.5 twice daily in addition to atorvastatin 80 mg and Plavix and aspirin.  When I saw him in April 2019 he had run out of his  lisinopril for the last 2 months.  His allergist had increase aspirin to 325 and he had continued to be on Plavix, carvedilol 12.5 twice a day, furosemide 40 mg and HCTZ 25 mg.  He had lost 35 pounds since October 2018.  He was no longer taking Spironolactone.  With his reduced EF I felt he was a good candidate for initiation of Entresto.  He was gradually been titrated up to maximum dose 97/103 and  saw Nicholas Kelly off [place] in May 2019 at which time the dose was titrated to maximum.  Apparently he took that dose for a month  since he had a 30-day free samples but unfortunately when he brought the prescription to [facility] they were unable to fill it since he had no insurance.  When I last saw him on July 26, 2018 he was restarted with samples of and on Entresto at 49/51 mg.  At the time he also was on carvedilol 12.5 mg twice a day, furosemide 60 mg daily without edema.  He was on atorvastatin 80 mg for hyperlipidemia.    I him in January 2020 and over the past several months prior to that evaluation he  felt improved with reinitiation of Entresto therapy.  At times he still noted some abdominal bloating.  He was  on carvedilol 12.5 twice a day, clonidine 0.1 mg twice a day, furosemide 60 mg daily and apparently HCTZ 25 mg.  During that evaluation, he had no signs of edema and appears euvolemic on exam.  So that I could further titrate Entresto back to maximum dosing, I suggested he discontinue HCTZ and also suggested he decrease Lasix.  For several weeks I also recommended slight additional titration of carvedilol.  At his office visit  in August 2020 he was no longer taking Entresto since his samples had run out over 3 months previously.  As result he noticed recurrent symptomatology with shortness of breath.  During that evaluation, I provided him with samples of Entresto 24/26 for him to take twice a day for 2 weeks and gave him another prescription for 49/51 for titration.  He was scheduled to see his pharmacist for follow-up evaluation.  I saw him on January 06, 2021 and prior to that evaluation I had not seen him since  August 2020 but he had been evaluated by Nicholas Kelly subsequent  to my evaluation.  At his March 2022 evaluation he was no longer on Entresto and has been on carvedilol 12.5 mg twice a day, furosemide 80 mg daily, losartan 50 mg daily.  He apparently has been taking supplemental potassium, clonidine 0.1 mg twice a day and continues to be on atorvastatin 80 mg for hyperlipidemia and Metformin 500 mg  twice a day for diabetes mellitus.  He is followed by Dr. Tomi Kelly for his multiple sclerosis.  Recent LDL level was 104.  During that evaluation, his blood pressure was elevated.  I recommended he discontinue supplemental potassium and initiated spironolactone 12.5 mg daily.  He continued to be on Jardiance 10 mg and was tolerating this well.  Recommended that he have a follow-up echo Doppler study for reassessment of LV function.  [name] underwent a follow-up echo Doppler study on April 09, 2021.  EF was improved now in the low normal range at 50 to 55% with frequent PVCs noted throughout the study.  There was mild LVH.  He had hyperdynamic RV function.  Compared to his prior study in 2018 he has had increased from 20  to 25%.  I saw him on May 12, 2021 at which time he was having frequent  urination and was taking furosemide 80 mg daily, Jardiance 10 mg, spironolactone 12.5 mg daily, losartan 50 mg, in addition to clonidine 0.1 mg twice a day and carvedilol 12.5 mg twice a day.  He continues to be on atorvastatin 80 mg.  He now had insurance.  He admits to weight gain.  During that evaluation since he now had insurance with [employer] I recommended reinstitution of Entresto and discontinuance of losartan.  I provided him with samples of Entresto 49/51 mg twice a day.  With his increased heart rate in the 80s and frequent PVCs I also recommended titration of carvedilol from 12.5 mg twice a day to 18.7 mg twice a day for the next week with then further increasing to 25 mg twice a day.  He continues to be on atorvastatin for hyperlipidemia.  Since my prior evaluation he was seen by our pharmacist, Nicholas Kelly, [place] on 2 occasions with titration of Entresto to 97/103 mg twice a day.  I last saw him on July 30, 2021 he continues to have issues with obesity and blood pressure has been elevated.  He had undergone laboratory by Dr. Tomi Kelly and he was started on supplemental over-the-counter vitamin D at  just 4000 units daily, although his vitamin D level was significantly depressed at 11.  He denies any chest pain.  He denies presyncope or syncope.  During that evaluation, he had obtained insurance and was back on Entresto dosing at 97/103 mg twice a day.  Spironolactone was further titrated to 12.5 mg twice a day with continued blood pressure elevation.  Upon further questioning, he does admit to poor sleep, has frequent awakenings, snoring, and daytime somnolence.  I recommended a sleep evaluation with high suspicion for obstructive sleep apnea.  He underwent a sleep study on October 13, 2021 and met criteria for split-night evaluation.  He had severe obstructive sleep apnea during the diagnostic portion with an AHI of 85.5 and RDI of 88.7.  This CPAP was initiated at 6 cm and was titrated to 13 cm of water.  He received a new ResMed AirSense 11 CPAP machine on December 02, 2021.  On his initial download from February 7 through December 31, 2021 he met compliance with 100% use.  Average use was 5 hours and 28 minutes.  On subsequent download, average use increased to 6 hours and 36 minutes from May 27 through June 18, 2022.  AHI is 2.3 at 13 cm water pressure.  However, he showed me his download from last night June 18, 2022 and AHI had not increased to 6.4.  He typically tries to go to bed by 9 PM and often wakes up at 5 AM.  Presently, he denies any chest pain.  He has had difficulty with his insurance and has not been on Entresto for the last 2 months.  He continues to be on Jardiance 10 mg, furosemide 80 mg daily, spironolactone but I am not certain if he is still taking 12.5 or 25 mg.  He also takes clonidine 0.2 mg twice a day.  He is on atorvastatin 80 mg and Zetia 10 mg for hyperlipidemia.  He is diabetic on metformin in addition to his Jardiance.  He presents for evaluation.  Past Medical History:  Diagnosis Date   Acute CHF (congestive heart failure) ([place]) 02/10/2017   Dyspnea    Hypertension     Stroke (  [place])     Past Surgical History:  Procedure Laterality Date   NO PAST SURGERIES     RIGHT/LEFT HEART CATH AND CORONARY ANGIOGRAPHY N/A 02/12/2017   Procedure: Right/Left Heart Cath and Coronary Angiography;  Surgeon: Belva Crome, MD;  Location: [facility];  Service: Cardiovascular;  Laterality: N/A;    Current Medications: Outpatient Medications Prior to Visit  Medication Sig Dispense Refill   aspirin 81 MG tablet Take 1 tablet (81 mg total) by mouth daily. 30 tablet 3   atorvastatin (LIPITOR) 80 MG tablet Take 1 tablet (80 mg total) by mouth daily at 6 PM. 90 tablet 1   Blood Glucose Monitoring Suppl (TRUE METRIX METER) w/Device KIT Use as instructed to check blood sugar BID. 1 kit 0   carvedilol (COREG) 25 MG tablet Take 1 tablet (25 mg total) by mouth 2 (two) times daily with a meal. 180 tablet 1   Cholecalciferol (VITAMIN D3) 1.25 MG (50000 UT) TABS Take 50,000 Units by mouth once a week. 4 tablet 5   cloNIDine (CATAPRES) 0.2 MG tablet Take 1 tablet (0.2 mg total) by mouth 2 (two) times daily. 180 tablet 1   ergocalciferol (VITAMIN D2) 1.25 MG (50000 UT) capsule Take 1 capsule (50,000 Units total) by mouth once a week. 4 capsule 5   ezetimibe (ZETIA) 10 MG tablet Take 1 tablet (10 mg total) by mouth daily. 90 tablet 1   furosemide (LASIX) 80 MG tablet TAKE 1 TABLET BY MOUTH DAILY. (Patient taking differently: Take 80 mg by mouth daily.) 90 tablet 1   glucose blood (TRUE METRIX BLOOD GLUCOSE TEST) test strip use as directed 100 strip 12   metFORMIN (GLUCOPHAGE) 1000 MG tablet Take 1 tablet (1,000 mg total) by mouth 2 (two) times daily with a meal. 180 tablet 1   ocrelizumab (OCREVUS) 300 MG/10ML injection Inject 600 mg into the vein once.     spironolactone (ALDACTONE) 25 MG tablet Take 0.5 tablets (12.5 mg total) by mouth daily. 45 tablet 3   TRUEplus Lancets 28G MISC Use to check blood sugar twice daily. 100 each 6   empagliflozin (JARDIANCE) 10 MG TABS tablet TAKE  1 TABLET BY MOUTH ONCE DAILY BEFORE BREAKFAST 90 tablet 0   sacubitril-valsartan (ENTRESTO) 49-51 MG Take 1 tablet by mouth 2 (two) times daily. 60 tablet 2   No facility-administered medications prior to visit.     Allergies:   Patient has no known allergies.   Social History   Socioeconomic History   Marital status: Divorced    Spouse name: Not on file   Number of children: 4   Years of education: Not on file   Highest education level: High school graduate  Occupational History   Not on file  Tobacco Use   Smoking status: Former    Packs/day: 1.00    Years: 34.00    Total pack years: 34.00    Types: Cigarettes    Quit date: 06/28/2016    Years since quitting: 5.9   Smokeless tobacco: Never  Vaping Use   Vaping Use: Never used  Substance and Sexual Activity   Alcohol use: Not Currently    Comment: stopped in 2018   Drug use: Yes    Comment: 02/10/2017 "experimented w/drugs; haven't done any for years"   Sexual activity: Not Currently  Other Topics Concern   Not on file  Social History Narrative   Lives in one story home with daughter; Left handed; some college; caffeine - occasional coffee  not daily; walks 2xweek   Social Determinants of Health   Financial Resource Strain: Medium Risk (11/03/2021)   Overall Financial Resource Strain (CARDIA)    Difficulty of Paying Living Expenses: Somewhat hard  Food Insecurity: No Food Insecurity (11/03/2021)   Hunger Vital Sign    Worried About Running Out of Food in the Last Year: Never true    Ran Out of Food in the Last Year: Never true  Transportation Needs: No Transportation Needs (11/03/2021)   PRAPARE - Hydrologist (Medical): No    Lack of Transportation (Non-Medical): No  Physical Activity: Not on file  Stress: Not on file  Social Connections: Not on file     Family History:  The patient's family history includes Cardiomyopathy in his mother; Hypertension in his mother; Multiple sclerosis in  his sister; Stroke in his maternal aunt.   ROS General: Negative; No fevers, chills, or night sweats;  HEENT: positive for recent visual disturbance. sinus congestion, difficulty swallowing Pulmonary: Negative; No cough, wheezing, shortness of breath, hemoptysis Cardiovascular: See HPI GI: Negative; No nausea, vomiting, diarrhea, or abdominal pain GU: Negative; No dysuria, hematuria, or difficulty voiding Musculoskeletal: Negative; no myalgias, joint pain, or weakness Hematologic/Oncology: Negative; no easy bruising, bleeding Endocrine: Negative; no heat/cold intolerance; no diabetes Neuro: positive for previous diplopia Skin: Negative; No rashes or skin lesions Psychiatric: Negative; No behavioral problems, depression Sleep: snoring, daytime sleepiness, hypersomnolence, no bruxism, restless legs, hypnogognic hallucinations, no cataplexy Other comprehensive 14 point system review is negative.   PHYSICAL EXAM:   VS:  BP (!) 144/90   Pulse 66   Ht '5\' 8"'  (1.727 m)   Wt (!) 307 lb 3.2 oz (139.3 kg)   SpO2 94%   BMI 46.71 kg/m     Repeat blood pressure by me was 126/82  Wt Readings from Last 3 Encounters:  06/19/22 (!) 307 lb 3.2 oz (139.3 kg)  06/18/22 (!) 303 lb (137.4 kg)  02/11/22 (!) 322 lb (146.1 kg)   General: Alert, oriented, no distress.  Morbid obesity with BMI 46.7 Skin: normal turgor, no rashes, warm and dry HEENT: Normocephalic, atraumatic. Pupils equal round and reactive to light; sclera anicteric; extraocular muscles intact;  Nose without nasal septal hypertrophy Mouth/Parynx benign; Mallinpatti scale 3/4 Neck: Thick neck no JVD, no carotid bruits; normal carotid upstroke Lungs: clear to ausculatation and percussion; no wheezing or rales Chest wall: without tenderness to palpitation Heart: PMI not displaced, RRR, s1 s2 normal, 1/6 systolic murmur, no diastolic murmur, no rubs, gallops, thrills, or heaves Abdomen: Central adiposity soft, nontender; no  hepatosplenomehaly, BS+; abdominal aorta nontender and not dilated by palpation. Back: no CVA tenderness Pulses 2+ Musculoskeletal: full range of motion, normal strength, no joint deformities Extremities: no clubbing cyanosis or edema, Homan's sign negative  Neurologic: grossly nonfocal; Cranial nerves grossly wnl Psychologic: Normal mood and affect   Studies/Labs Reviewed:   August 25, 2023ECG (independently read by me): Sinus rhythm with PVCs; QTc 499 msec  May 12, 2021 ECG (independently read by me): Normal sinus rhythm at 80 bpm, sinus arrhythmia, PVC, Right branch block, left ventricular hypertrophy and left axis deviation. A repeat ECG was also performed which showed sinus rhythm at 84 with frequent PVCs, possible left atrial enlargement, left axis deviation, right bundle branch block, and LVH.  January 06, 2021 ECG (independently read by me): Sinus rhythm at 87, PVCs, RBBB, LAHB, LVH  June 15, 2019 ECG (independently read by me): NSR at 64; RBBB  November 22, 2018 ECG (independently read by me): Sinus rhythm with occasional PACs as well as PVC.  Left axis deviation.  Right bundle branch block.  LVH by voltage.  July 26, 2018 ECG (independently read by me): Sinus bradycardia at 53 bpm.  Right bundle branch block. LVH by voltage; normal intervals  February 20, 2018 ECG (independently read by me): Normal sinus rhythm at 74 bpm.  Left axis deviation.  Right bundle branch block.  LVH by voltage criteria.  QTc interval 492 ms.  August 18 2017 ECG (independently read by me): Normal sinus rhythm at 66 bpm.  Right bundle branch block with repolarization changes.  Inferolateral T change.  T-wave changes.  LVH.  QTc interval 461 ms.  04/21/2017 EKG:  EKG is ordered today. Normal sinus rhythm at 77 bpm.  LVH with repolarization changes.  PR interval 184 ms.  QTc interval 488 ms.  03/11/2017 ECG (independently read by me): Normal sinus rhythm at 73 bpm.  Left axis deviation.  LVH by voltage  criteria with repolarization changes.  Recent Labs:    Latest Ref Rng & Units 02/11/2022    9:12 AM 12/18/2021    2:21 PM 09/11/2021   10:56 AM  BMP  Glucose 70 - 99 mg/dL 103  158  87   BUN 6 - 23 mg/dL '16  15  14   ' Creatinine 0.40 - 1.50 mg/dL 0.90  0.99  1.09   BUN/Creat Ratio 9 - '20  15  13   ' Sodium 135 - 145 mEq/L 142  145  141   Potassium 3.5 - 5.1 mEq/L 4.3  4.4  4.3   Chloride 96 - 112 mEq/L 105  104  101   CO2 19 - 32 mEq/L '29  23  24   ' Calcium 8.4 - 10.5 mg/dL 8.9  9.6  10.1         Latest Ref Rng & Units 02/11/2022    9:12 AM 06/20/2021   11:17 AM 05/19/2021    8:19 AM  Hepatic Function  Total Protein 6.0 - 8.3 g/dL 7.3  8.2  7.7   Albumin 3.5 - 5.2 g/dL 4.1  4.1  4.2   AST 0 - 37 U/L '11  18  13   ' ALT 0 - 53 U/L '13  18  17   ' Alk Phosphatase 39 - 117 U/L 61  58  70   Total Bilirubin 0.2 - 1.2 mg/dL 0.6  0.7  0.8        Latest Ref Rng & Units 02/11/2022    9:12 AM 06/20/2021   11:17 AM 05/19/2021    8:19 AM  CBC  WBC 4.0 - 10.5 K/uL 7.7  9.0  9.3   Hemoglobin 13.0 - 17.0 g/dL 12.8  13.1  13.2   Hematocrit 39.0 - 52.0 % 39.1  40.0  40.3   Platelets 150.0 - 400.0 K/uL 260.0  307.0  300    Lab Results  Component Value Date   MCV 95.2 02/11/2022   MCV 94.0 06/20/2021   MCV 93 05/19/2021   Lab Results  Component Value Date   TSH 3.140 05/19/2021   Lab Results  Component Value Date   HGBA1C 5.7 (H) 06/18/2022     BNP    Component Value Date/Time   BNP 7.2 12/18/2019 1556   BNP 752.4 (H) 02/10/2017 1525    ProBNP    Component Value Date/Time   PROBNP 15 05/19/2021 0819     Lipid Panel  Component Value Date/Time   CHOL 205 (H) 05/19/2021 0819   TRIG 121 05/19/2021 0819   HDL 36 (L) 05/19/2021 0819   CHOLHDL 5.7 (H) 05/19/2021 0819   CHOLHDL 4.9 07/18/2017 0558   VLDL 11 07/18/2017 0558   LDLCALC 147 (H) 05/19/2021 0819     RADIOLOGY: No results found.   Additional studies/ records that were reviewed today include:  I reviewed  the patient's recent hospitalization, his cardiac consultation, cardiac catheterization report, and hospitalist records. I reviewed his hospitalization from September 22 through 07/19/2017 including imaging studies and echocardiogram.  ECHO 04/09/2021 IMPRESSIONS   1. Heart rate varied during the study - possible atrial arrythmias and  frequent PVC's were noted, also making LVEF determination difficult. Left  ventricular ejection fraction, by estimation, is 50 to 55%. The left  ventricle has low normal function. The  left ventricle has no regional wall motion abnormalities. There is mild  left ventricular hypertrophy. Indeterminate diastolic filling due to E-A  fusion.   2. Right ventricular systolic function is hyperdynamic. The right  ventricular size is normal.   3. The mitral valve is grossly normal. Trivial mitral valve  regurgitation.   4. The aortic valve is tricuspid. Aortic valve regurgitation is not  visualized.   Comparison(s): Prior images unable to be directly viewed, comparison made  by report only. Changes from prior study are noted. 07/18/2017: LVEF  20-25%.   ASSESSMENT:    1. Non-ischemic cardiomyopathy ([place])   2. Chronic combined systolic and diastolic CHF (congestive heart failure) ([place])   3. OSA (obstructive sleep apnea)   4. Hyperlipidemia LDL goal <70   5. Type 2 diabetes mellitus without complication, without long-term current use of insulin ([place])   6. Morbid obesity [facility])      PLAN:  Nicholas Kelly is a 56 year-old African-American gentleman who has a history of morbid obesity and has documented reduced EF noted in April 2018.  He has been an excellent candidate for [facility] and previously had been uptitrated to 97/103 twice daily with significant improvement but unfortunately had stopped therapy when he could not get additional medication.  He  was able to be restarted back on Entresto and  was titrated to 49/51 and later 97/103 mm successfully.  He again  stopped using Entresto when his samples ran out which he believes may have been sometime in March or April 2020.  When I saw him in August 2020 I again reinstituted therapy.  When most recently seen in March 2022, he was no longer taking Entresto unfortunately due to cost issues and not having insurance.  He gained significant weight up to 335 when last evaluated in July 2022.  He is subsequently obtained insurance with [employer] and has been successfully titrated back to maximum Entresto dosing at 97/103 mg twice daily.  He also has been titrated to carvedilol 25 mg twice a day and has been on Jardiance 10 mg and spironolactone 12.5 mg daily.  At his last evaluation with me in October 2022 he was on Entresto and with blood pressure elevation I further titrated spironolactone to 12.5 mg twice a day which should offer benefit in his blood pressure as well.  He was being treated by Dr. Tomi Kelly for vitamin D deficiency.  During my evaluation he also had significant sleep disturbance and I recommended he undergo a sleep evaluation.  He was found to have severe obstructive sleep apnea on a split-night protocol with AHI at 85.5/h.  He had severe oxygen  desaturation to a nadir of 70% with time spent below 88% at approximately 55 minutes.  He has now been on CPAP therapy since his set up date on December 02, 2021 and is meeting compliance standards.  I again spent time with him today discussing the importance of continued therapy and the potential adverse cardiovascular consequences of untreated sleep apnea regarding both blood pressure, nocturnal arrhythmias, increased incidence of atrial fibrillation, effects on glucose/insulin resistance, inflammation, GERD, and potential for nocturnal hypoxemia contributing to ischemia.  I will change his CPAP settings from a set pressure of 13 to a auto mode with a range of 13 to 18 cm of water.  With reference to his cardiomyopathy, unfortunately he has been off Entresto for  approximately 2 months.  I had Levy Sjogren meet with him today in the office who verified that actually he does have insurance which should cover his Entresto.  He was given samples of 49/51 mg twice a day and told to reinstitute therapy and obtain his new prescription.  I am recommending in 2 to 3 months he undergo a follow-up echo Doppler study for reassessment of LV function following reinstitution of treatment.  He continues to be on atorvastatin 80 mg and Zetia 10 mg for hyperlipidemia.  He sees Nicholas Kelly for primary care who will be checking laboratory.  I will see him in 4 months for reevaluation.   Medication Adjustments/Labs and Tests Ordered: Current medicines are reviewed at length with the patient today.  Concerns regarding medicines are outlined above.  Medication changes, Labs and Tests ordered today are listed in the Patientt Instructions below. Patient Instructions  Medication Instructions:   Restart taking Entresto 49/51 mg twice a day     *If you need a refill on your cardiac medications before your next appointment, please call your pharmacy*   Lab Work: Not needed If you have labs (blood work) drawn today and your tests are completely normal, you will receive your results only by: [place] (if you have MyChart) OR A paper copy in the mail If you have any lab test that is abnormal or we need to change your treatment, we will call you to review the results.   Testing/Procedures: Will be schedule in 3 to 4 months  after being  on Entresto - [facility] has requested that you have an echocardiogram. Echocardiography is a painless test that uses sound waves to create images of your heart. It provides your doctor with information about the size and shape of your heart and how well your heart's chambers and valves are working. This procedure takes approximately one hour. There are no restrictions for this  procedure.   Follow-Up: At [facility], you and your health needs are our priority.  As part of our continuing mission to provide you with exceptional heart care, we have created designated Provider Care Teams.  These Care Teams include your primary Cardiologist (physician) and Advanced Practice Providers (APPs -  Physician Assistants and Nurse Practitioners) who all work together to provide you with the care you need, when you need it.     Your next appointment:   5 month(s)  The format for your next appointment:   In Person  Provider:   [name], MD    Other Instructions   pressure changes were done to your C-PAP - auto 13 to 18    Signed, [name], MD , [facility] 06/20/2022 3:19 PM  [place] [address], [place], [place], [place]  [address] Phone: [phone]

## 2022-06-19 NOTE — Addendum Note (Signed)
Addended by: Cheree Ditto on: 06/19/2022 01:10 PM   Modules accepted: Orders

## 2022-06-20 ENCOUNTER — Encounter: Payer: Self-pay | Admitting: Cardiovascular Disease

## 2022-06-23 ENCOUNTER — Other Ambulatory Visit: Payer: Self-pay

## 2022-07-02 DIAGNOSIS — G4733 Obstructive sleep apnea (adult) (pediatric): Secondary | ICD-10-CM | POA: Diagnosis not present

## 2022-07-09 ENCOUNTER — Other Ambulatory Visit (HOSPITAL_COMMUNITY): Payer: Medicare Other

## 2022-07-28 ENCOUNTER — Other Ambulatory Visit: Payer: Self-pay

## 2022-07-29 ENCOUNTER — Other Ambulatory Visit: Payer: Self-pay

## 2022-07-29 ENCOUNTER — Telehealth: Payer: Self-pay | Admitting: Cardiovascular Disease

## 2022-07-29 ENCOUNTER — Other Ambulatory Visit: Payer: Self-pay | Admitting: Cardiovascular Disease

## 2022-07-29 DIAGNOSIS — E119 Type 2 diabetes mellitus without complications: Secondary | ICD-10-CM

## 2022-07-29 NOTE — Telephone Encounter (Signed)
Pt c/o medication issue:  1. Name of Medication:   empagliflozin (JARDIANCE) 10 MG TABS tablet    2. How are you currently taking this medication (dosage and times per day)? 1 tablet daily  3. Are you having a reaction (difficulty breathing--STAT)? no  4. What is your medication issue? Patient states the medication will cost him $300. Please advise.

## 2022-07-29 NOTE — Telephone Encounter (Signed)
Spoke with pt and he states that the price of his Vania Rea has increased from $47 to $ 300. Pt states that it did not increase until [facility] called in his Jardiance.

## 2022-08-01 DIAGNOSIS — G4733 Obstructive sleep apnea (adult) (pediatric): Secondary | ICD-10-CM | POA: Diagnosis not present

## 2022-08-05 ENCOUNTER — Other Ambulatory Visit: Payer: Self-pay

## 2022-08-05 ENCOUNTER — Other Ambulatory Visit: Payer: Self-pay | Admitting: Cardiovascular Disease

## 2022-08-05 MED ORDER — CLONIDINE HCL 0.2 MG PO TABS
0.2000 mg | ORAL_TABLET | Freq: Two times a day (BID) | ORAL | 3 refills | Status: DC
  Filled 2022-08-05 – 2022-10-21 (×2): qty 180, 90d supply, fill #0
  Filled 2023-01-12 – 2023-01-22 (×2): qty 180, 90d supply, fill #1
  Filled 2023-04-26: qty 180, 90d supply, fill #2

## 2022-08-05 NOTE — Telephone Encounter (Signed)
Recommend printing out Jardiance patient assistance form from [employer] and having patient sign

## 2022-08-05 NOTE — Telephone Encounter (Signed)
Spoke with pt, aware paperwork for jardiance patient assistance mailed to the patient

## 2022-08-06 ENCOUNTER — Other Ambulatory Visit: Payer: Self-pay

## 2022-08-06 ENCOUNTER — Other Ambulatory Visit (HOSPITAL_BASED_OUTPATIENT_CLINIC_OR_DEPARTMENT_OTHER): Payer: Self-pay

## 2022-08-10 ENCOUNTER — Other Ambulatory Visit: Payer: Self-pay

## 2022-08-11 NOTE — Progress Notes (Unsigned)
NEUROLOGY FOLLOW UP OFFICE NOTE  Nicholas Kelly [id number]  Assessment/Plan:   Multiple sclerosis Cervical myelopathy - asymptomatic.  Neurosurgery defers intervention at this time. Hypertension    1  DMT:  Ocrevus 2. D3 50,000 IU weekly 3.  Check MRI of brain and cervical spine with and without contrast in 6 months. 4.  Check CBC w/diff, CMP, vit D and quantitative immunoglobulin panel today and in 6 months. 5.  Follow up with PCP regarding blood pressure 6.  Follow up 6 months.   Subjective:  Nicholas Kelly is a 56 year old left-handed black man with non-ischemic cardiomyopathy, hypertension and history of stroke who follows up for multiple sclerosis.  MRIs from May personally reviewed.   UPDATE: Current DMT:  Ocrevus.  Other medications:  D3 50,000 IU weekly  02/11/2022 LABS:  CBC with WBC 7.7, HGB 12.8, HCT 39.1, PLT 260, ALC 1.8; CMP with Na 142, K 4.3, CL 105, CO2 29, glucose 103, BUN 16, Cr 0.90, Ca 8.9, t bili 0.6, ALP 61, AST 11, ALT 13; IgA 414, KgG 1,638, IgM 27; vit D 12.61.  Increased vit D from 4000 IU daily to 50,000 IU weekly.    Vision:  No issues Motor:  Left greater than right legs feel heavy as the day progresses.  Upper extremities feel fine.   Sensory:  No issues Pain:  No issues Gait:  Sometimes feels a little off-balance Bowel/Bladder:  sometimes decreased bowel movements.  Feels bloated.   Fatigue:  No issues Cognition:  No issues Mood:  No issues   HISTORY:  In 2007, he was working at [employer] and he tripped over something and fell forward landing on his arms.  After he fell, his arms were contracted.  He was told it was due to trauma and symptoms resolved after a month.   He was admitted to [facility] on 07/17/17 for diplopia and disequilibrium.  Exam revealed disconjugate gaze, with bilateral horizontal gaze palsy with inward deviation.  CT of head showed old right parietal infarct.  MRI of brain showed encephalomalacia in the  bilateral pons and right parietal lobe but no acute findings.  Findings in the pons was determined to represent the cause of symptoms, now chronic due to late presentation.  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     In August 2020, he woke up with left arm weakness and arm and hand were slight contracted.  He also noticed that his speech was not normal.  He noted some mild numbness in the left arm as well.  No associated headache, visual disturbance.  He presented to the ED about a week later on 06/15/19 for further evaluation.  CT of brain showed atrophy and chronic small vessel ischemic changes but no acute intracranial abnormality.   MRI of brain without contrast, which was limited due to motion artifact, showed pathy confluent T2/FLAIR signal abnormality involving the periventricular cerebral white matter and pons.  Concern that this may represent demyelinating disease.  No associated restricted diffusion to suggest active demyelination.  MRI of cervical spine without contrast, also limited due to motion artifact, showed patchy cord signal abnormality involving the upper cervical spinal cord at level of C2-3 and C3-4 with no obvious cord expansion to suggest active demyelinating disease.  Also demonstrated, multilevel cervical spondylosis with diffuse mild spinal stenosis at C2-C3 through C5-C6, mild right C4 foraminal narrowing and mild to moderate left C5 foraminal narrowing and mild  bilateral C7 foraminal narrowing.  Neurologic exam in the ED revealed questionable mild dysarthria but otherwise no focal or lateralizing findings.  MRI was reviewed by the neurohospitalist who questioned that findings represented a new demyelinating process.  He was discharged with outpatient neurology follow up. MRI of brain and cervical spine from 09/19/2019 showed cerebral white matter disease suggestive of MS and probable underlying chronic small vessel ischemic changes.  Cervical spine  showed hyperintensity and cord mass effect at the C2-3 and C3-4 levels with associated degenerative spinal stenosis rather than MS lesions.  He was referred to neurosurgery.   Because he was asymptomatic, surgical intervention was not recommended.  He underwent LP on 09/29/2019 where CSF analysis demonstrated over 5 oligoclonal bands and IgG index 1.82.   Labs: 09/29/2019 Lumbar Puncture:  CSF cell count 28/96% lymphs, culture with moderate WBCs but no organisms, glucose 75, protein 55, myelin basic protein <2, >5 oligoclonal bands, IgG index 1.82, cytology no malignant cells 10/11/2019 LABS:  JCV antibody positive with index of 1.66.     Imaging: 03/03/2021 MRI BRAIN W WO:  Stable exam since November of 2020. Combination of findings likely representing the presence of chronic demyelinating disease, chronic small-vessel disease, and old cortical infarction in the right frontoparietal junction region. 03/12/2021 MRI C-SPINE W WO:  1. Similar short-segment dorsal cord signal abnormality at C2/C3, which could relate to prior demyelination versus compressive myelopathy given degenerative cord mass effect at these levels. No definite new cord lesion or cord enhancement on this motion limited exam.  2. Similar multilevel degenerative change 03/12/2021 MRI T-SPINE W WO:  1. No convincing cord signal abnormality or enhancement on this mildly motion limited exam. 2. Left paracentral disc protrusion at T9-T10 contacts and flattens the left ventral cord and narrows the left root entry zone with overall mild canal stenosis. Additional small posterior disc bulges at T6-T7, T7-T8 and T8-T9 which partially efface ventral CSF.  3. Moderate left foraminal stenosis at T8-T9, T9-T10, T10-T11, and T11-T12 with additional multilevel mild foraminal stenosis. 4. Small right pleural effusion with rounded 1.1 cm opacity in the right lower lobe (series 20, image 35), suboptimally evaluated on this study. This may represent  consolidation or atelectasis, but pulmonary nodule is not excluded and CT of the chest is recommended to further characterize (particularly given the patient's smoking history). 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  PAST MEDICAL HISTORY: Past Medical History:  Diagnosis Date   Acute CHF (congestive heart failure) ([place]) 02/10/2017   Dyspnea    Hypertension    Stroke [facility])     MEDICATIONS: Current Outpatient Medications on File Prior to Visit  Medication Sig Dispense Refill   aspirin 81 MG tablet Take 1 tablet (81 mg total) by mouth daily. 30 tablet 3   atorvastatin (LIPITOR) 80 MG tablet Take 1 tablet (80 mg total) by mouth daily at 6 PM. 90 tablet 1   Blood Glucose Monitoring Suppl (TRUE METRIX METER) w/Device KIT Use as instructed to check blood sugar BID. 1  kit 0   carvedilol (COREG) 25 MG tablet Take 1 tablet (25 mg total) by mouth 2 (two) times daily with a meal. 180 tablet 1   Cholecalciferol (VITAMIN D3) 1.25 MG (50000 UT) TABS Take 50,000 Units by mouth once a week. 4 tablet 5   cloNIDine (CATAPRES) 0.2 MG tablet Take 1 tablet (0.2 mg total) by mouth 2 (two) times daily. 180 tablet 3   empagliflozin (JARDIANCE) 10 MG TABS tablet TAKE 1 TABLET BY MOUTH ONCE DAILY BEFORE BREAKFAST 90 tablet 3   ergocalciferol (VITAMIN D2) 1.25 MG (50000 UT) capsule Take 1 capsule (50,000 Units total) by mouth  once a week. 4 capsule 5   ezetimibe (ZETIA) 10 MG tablet Take 1 tablet (10 mg total) by mouth daily. 90 tablet 1   furosemide (LASIX) 80 MG tablet TAKE 1 TABLET BY MOUTH DAILY. (Patient taking differently: Take 80 mg by mouth daily.) 90 tablet 1   glucose blood (TRUE METRIX BLOOD GLUCOSE TEST) test strip use as directed 100 strip 12   metFORMIN (GLUCOPHAGE) 1000 MG tablet Take 1 tablet (1,000 mg total) by mouth 2 (two) times daily with a meal. 180 tablet 1   ocrelizumab (OCREVUS) 300 MG/10ML injection Inject 600 mg into the vein once.     sacubitril-valsartan (ENTRESTO) 49-51 MG Take 1 tablet by mouth 2 (two) times daily. 60 tablet 11   spironolactone (ALDACTONE) 25 MG tablet Take 0.5 tablets (12.5 mg total) by mouth daily. 45 tablet 3   TRUEplus Lancets 28G MISC Use to check blood sugar twice daily. 100 each 6   No current facility-administered medications on file prior to visit.    ALLERGIES: No Known Allergies  FAMILY HISTORY: Family History  Problem Relation Age of Onset   Hypertension Mother    Cardiomyopathy Mother    Stroke Maternal Aunt    Multiple sclerosis Sister       Objective:  Blood pressure (!) 167/96, pulse 65, height '5\' 8"'  (1.727 m), weight (!) 304 lb (137.9 kg), SpO2 97 %. General: No acute distress.  Patient appears well-groomed.   Head:  Normocephalic/atraumatic Eyes:  Fundi examined but not visualized Neck: supple, no paraspinal tenderness, full range of motion Heart:  Regular rate and rhythm Back: No paraspinal tenderness Neurological Exam: alert and oriented to person, place, and time.  Speech fluent and not dysarthric, language intact.  CN II-XII intact. Bulk and tone normal, mildly reduced finger-thumb tapping speed and amplitude on the left; muscle strength 5/5 throughout.  Sensation to light touch intact.  Deep tendon reflexes 2+ throughout.  Finger to nose testing intact.  Mildly broad-based gait, Romberg negative.   Metta Clines, DO  CC: [name], MD

## 2022-08-13 ENCOUNTER — Encounter: Payer: Self-pay | Admitting: Neurology

## 2022-08-13 ENCOUNTER — Ambulatory Visit (INDEPENDENT_AMBULATORY_CARE_PROVIDER_SITE_OTHER): Payer: Medicare Other | Admitting: Neurology

## 2022-08-13 ENCOUNTER — Other Ambulatory Visit: Payer: Self-pay

## 2022-08-13 ENCOUNTER — Other Ambulatory Visit (INDEPENDENT_AMBULATORY_CARE_PROVIDER_SITE_OTHER): Payer: Medicare Other

## 2022-08-13 ENCOUNTER — Ambulatory Visit: Payer: Self-pay | Admitting: *Deleted

## 2022-08-13 VITALS — BP 167/96 | HR 65 | Ht 68.0 in | Wt 304.0 lb

## 2022-08-13 DIAGNOSIS — I10 Essential (primary) hypertension: Secondary | ICD-10-CM

## 2022-08-13 DIAGNOSIS — G959 Disease of spinal cord, unspecified: Secondary | ICD-10-CM

## 2022-08-13 DIAGNOSIS — G35 Multiple sclerosis: Secondary | ICD-10-CM | POA: Diagnosis not present

## 2022-08-13 LAB — CBC WITH DIFFERENTIAL/PLATELET
Basophils Absolute: 0 10*3/uL (ref 0.0–0.1)
Basophils Relative: 0.6 % (ref 0.0–3.0)
Eosinophils Absolute: 0.5 10*3/uL (ref 0.0–0.7)
Eosinophils Relative: 6.5 % — ABNORMAL HIGH (ref 0.0–5.0)
HCT: 39.7 % (ref 39.0–52.0)
Hemoglobin: 13.1 g/dL (ref 13.0–17.0)
Lymphocytes Relative: 22.6 % (ref 12.0–46.0)
Lymphs Abs: 1.9 10*3/uL (ref 0.7–4.0)
MCHC: 32.9 g/dL (ref 30.0–36.0)
MCV: 94 fl (ref 78.0–100.0)
Monocytes Absolute: 1.1 10*3/uL — ABNORMAL HIGH (ref 0.1–1.0)
Monocytes Relative: 13.3 % — ABNORMAL HIGH (ref 3.0–12.0)
Neutro Abs: 4.7 10*3/uL (ref 1.4–7.7)
Neutrophils Relative %: 57 % (ref 43.0–77.0)
Platelets: 288 10*3/uL (ref 150.0–400.0)
RBC: 4.23 Mil/uL (ref 4.22–5.81)
RDW: 14.4 % (ref 11.5–15.5)
WBC: 8.2 10*3/uL (ref 4.0–10.5)

## 2022-08-13 LAB — VITAMIN D 25 HYDROXY (VIT D DEFICIENCY, FRACTURES): VITD: 27.75 ng/mL — ABNORMAL LOW (ref 30.00–100.00)

## 2022-08-13 LAB — COMPREHENSIVE METABOLIC PANEL
ALT: 12 U/L (ref 0–53)
AST: 16 U/L (ref 0–37)
Albumin: 4.2 g/dL (ref 3.5–5.2)
Alkaline Phosphatase: 59 U/L (ref 39–117)
BUN: 12 mg/dL (ref 6–23)
CO2: 28 mEq/L (ref 19–32)
Calcium: 9.3 mg/dL (ref 8.4–10.5)
Chloride: 105 mEq/L (ref 96–112)
Creatinine, Ser: 0.87 mg/dL (ref 0.40–1.50)
GFR: 95.63 mL/min (ref >60.00)
Glucose, Bld: 118 mg/dL — ABNORMAL HIGH (ref 70–99)
Potassium: 4 mEq/L (ref 3.5–5.1)
Sodium: 140 mEq/L (ref 135–145)
Total Bilirubin: 0.7 mg/dL (ref 0.2–1.2)
Total Protein: 7.7 g/dL (ref 6.0–8.3)

## 2022-08-13 NOTE — Telephone Encounter (Signed)
Message from [place] sent at 08/13/2022  9:34 AM EDT  Summary: lancets and pen needles   Pt states he is out of lancets and pen needles and his previous ones has been discontinued   Pt states he does not know which brand he needs   Please assist further           Call History   Type Contact Phone/Fax User  08/13/2022 09:32 AM EDT Phone (Incoming) Berna Spare, Rudene Re. (Self) [phone] Jerilynn Mages) [place], Loura Pardon

## 2022-08-13 NOTE — Patient Instructions (Addendum)
Continue Ocrevus Continue vit D 50,000 units every 7 days Check labs today:  CBC w/diff, CMP, immunoglobulins and vit D and repeat in 6 months Check MRI of brain and cervical spine with and without contrast in 6 months.  Your provider has requested that you have labwork completed today. Please go to [facility] ([id number]) on the second floor of this building before leaving the office today. You do not need to check in. If you are not called within 15 minutes please check with the front desk.

## 2022-08-13 NOTE — Telephone Encounter (Signed)
Pt's message forwarded to Primary Care at [facility] regarding the types of lancets and pen needles he will need since the previous ones have been discontinued.

## 2022-08-14 ENCOUNTER — Other Ambulatory Visit: Payer: Self-pay | Admitting: *Deleted

## 2022-08-14 ENCOUNTER — Other Ambulatory Visit: Payer: Self-pay

## 2022-08-14 DIAGNOSIS — E119 Type 2 diabetes mellitus without complications: Secondary | ICD-10-CM

## 2022-08-14 LAB — IGG, IGA, IGM
IgG (Immunoglobin G), Serum: 1645 mg/dL — ABNORMAL HIGH (ref 600–1640)
IgM, Serum: 25 mg/dL — ABNORMAL LOW (ref 50–300)
Immunoglobulin A: 455 mg/dL — ABNORMAL HIGH (ref 47–310)

## 2022-08-14 MED ORDER — BLOOD GLUCOSE MONITOR KIT
PACK | 0 refills | Status: DC
  Filled 2022-08-14 – 2022-08-26 (×2): qty 1, 30d supply, fill #0

## 2022-08-14 NOTE — Progress Notes (Signed)
Patient advise of his labs results.

## 2022-08-17 ENCOUNTER — Other Ambulatory Visit: Payer: Self-pay | Admitting: Family Medicine

## 2022-08-17 ENCOUNTER — Other Ambulatory Visit: Payer: Self-pay

## 2022-08-17 DIAGNOSIS — E119 Type 2 diabetes mellitus without complications: Secondary | ICD-10-CM

## 2022-08-17 DIAGNOSIS — E1165 Type 2 diabetes mellitus with hyperglycemia: Secondary | ICD-10-CM

## 2022-08-17 MED ORDER — ACCU-CHEK SOFTCLIX LANCETS MISC
12 refills | Status: AC
  Filled 2022-08-17 – 2022-08-26 (×2): qty 100, 30d supply, fill #0

## 2022-08-17 MED ORDER — TRUEPLUS LANCETS 28G MISC
6 refills | Status: AC
  Filled 2022-08-17: qty 100, fill #0

## 2022-08-17 MED ORDER — TRUE METRIX BLOOD GLUCOSE TEST VI STRP
ORAL_STRIP | 12 refills | Status: AC
  Filled 2022-08-17: qty 100, 25d supply, fill #0
  Filled 2022-08-26: qty 100, 30d supply, fill #0

## 2022-08-17 NOTE — Telephone Encounter (Signed)
New machine has been ordered for patient

## 2022-08-24 ENCOUNTER — Other Ambulatory Visit: Payer: Self-pay

## 2022-08-25 ENCOUNTER — Telehealth: Payer: Self-pay | Admitting: *Deleted

## 2022-08-25 NOTE — Telephone Encounter (Signed)
I CALLED PATIENT TO LET HIM NOW THAT HIS LANCETS AND STRIPS WERE ORDER AND SHOULD BE READY FOR PICKUP  Copiedrom [place] [id number]. Topic: General - Other >> Aug 25, 2022 11:26 AM Leitha Schuller wrote: Reason for CRM: Pt checking status of return call regarding lancets and pen needles   Please reference 10-19 NT TE

## 2022-08-26 ENCOUNTER — Other Ambulatory Visit: Payer: Self-pay

## 2022-08-27 ENCOUNTER — Other Ambulatory Visit: Payer: Self-pay

## 2022-09-01 DIAGNOSIS — G4733 Obstructive sleep apnea (adult) (pediatric): Secondary | ICD-10-CM | POA: Diagnosis not present

## 2022-09-07 ENCOUNTER — Other Ambulatory Visit: Payer: Self-pay | Admitting: Cardiovascular Disease

## 2022-09-07 ENCOUNTER — Other Ambulatory Visit: Payer: Self-pay

## 2022-09-07 DIAGNOSIS — E119 Type 2 diabetes mellitus without complications: Secondary | ICD-10-CM

## 2022-09-07 DIAGNOSIS — I5042 Chronic combined systolic (congestive) and diastolic (congestive) heart failure: Secondary | ICD-10-CM

## 2022-09-08 ENCOUNTER — Other Ambulatory Visit: Payer: Self-pay

## 2022-09-08 MED ORDER — EMPAGLIFLOZIN 10 MG PO TABS
10.0000 mg | ORAL_TABLET | Freq: Every day | ORAL | 0 refills | Status: DC
  Filled 2022-09-08 – 2022-09-25 (×2): qty 30, 30d supply, fill #0
  Filled 2023-01-12: qty 90, 90d supply, fill #0
  Filled 2023-01-22: qty 30, 30d supply, fill #0
  Filled 2023-02-24: qty 30, 30d supply, fill #1
  Filled 2023-03-26 – 2023-04-28 (×3): qty 30, 30d supply, fill #2

## 2022-09-10 ENCOUNTER — Ambulatory Visit (HOSPITAL_COMMUNITY): Payer: Medicare Other | Attending: Cardiovascular Disease

## 2022-09-10 DIAGNOSIS — I5042 Chronic combined systolic (congestive) and diastolic (congestive) heart failure: Secondary | ICD-10-CM | POA: Diagnosis not present

## 2022-09-10 DIAGNOSIS — I428 Other cardiomyopathies: Secondary | ICD-10-CM

## 2022-09-10 LAB — ECHOCARDIOGRAM COMPLETE
Area-P 1/2: 3.22 cm2
S' Lateral: 4.3 cm

## 2022-09-10 MED ORDER — PERFLUTREN LIPID MICROSPHERE: 1.0000 mL | INTRAVENOUS | Status: DC | PRN

## 2022-09-10 MED ORDER — PERFLUTREN LIPID MICROSPHERE
1.0000 mL | INTRAVENOUS | Status: AC | PRN
  Administered 2022-09-10: 2 mL via INTRAVENOUS

## 2022-09-14 ENCOUNTER — Other Ambulatory Visit: Payer: Self-pay

## 2022-09-19 ENCOUNTER — Other Ambulatory Visit: Payer: Medicare Other

## 2022-09-21 NOTE — Progress Notes (Unsigned)
Ocrevus PA started and Approved, auth# [id number], Ref# Auth# and today date.

## 2022-09-25 ENCOUNTER — Other Ambulatory Visit: Payer: Self-pay

## 2022-09-28 ENCOUNTER — Other Ambulatory Visit: Payer: Self-pay | Admitting: Cardiovascular Disease

## 2022-09-28 ENCOUNTER — Other Ambulatory Visit: Payer: Self-pay

## 2022-09-28 DIAGNOSIS — E785 Hyperlipidemia, unspecified: Secondary | ICD-10-CM

## 2022-09-29 ENCOUNTER — Other Ambulatory Visit: Payer: Self-pay

## 2022-09-29 MED ORDER — EZETIMIBE 10 MG PO TABS
10.0000 mg | ORAL_TABLET | Freq: Every day | ORAL | 1 refills | Status: DC
  Filled 2022-09-29 – 2023-01-05 (×3): qty 90, 90d supply, fill #0
  Filled 2023-01-05: qty 90, 90d supply, fill #1

## 2022-09-29 MED ORDER — FUROSEMIDE 80 MG PO TABS
80.0000 mg | ORAL_TABLET | Freq: Every day | ORAL | 1 refills | Status: DC
  Filled 2022-09-29 – 2023-01-05 (×3): qty 90, 90d supply, fill #0
  Filled 2023-03-29 – 2023-04-07 (×2): qty 90, 90d supply, fill #1

## 2022-10-06 ENCOUNTER — Other Ambulatory Visit: Payer: Self-pay

## 2022-10-07 ENCOUNTER — Other Ambulatory Visit (HOSPITAL_COMMUNITY): Payer: Self-pay

## 2022-10-21 ENCOUNTER — Other Ambulatory Visit: Payer: Self-pay

## 2022-10-30 NOTE — Progress Notes (Deleted)
Cardiology Office Note    Date:  10/30/2022   ID:  Nicholas [facility]., DOB 06-20-1966, MRN [id number]  PCP:  Nicholas Mai, MD  Cardiologist:  [name], MD   5 month follow-up visit.  History of Present Illness:  Nicholas Kelly. is a 57 y.o. male who presents for a 8 month follow-up cardiology evaluation.  [name] has a history of hypertension, and presented to [facility] on 02/10/2017 from urgent care with acute onset of shortness of breath.  He was previously diagnosed with CHF in October 17 and had been on HCTZ but had run out of this prescription.  He was admitted by the hospitalist service.  A chest x-ray showed findings of CHF with pulmonary edema.  BNP was elevated at 752.  An echo Doppler study showed reduced LV function with an EF of 20-25% with moderate concentric LVH, diffuse hypokinesis, grade 3 diastolic dysfunction, mild mitral regurgitation, moderate tricuspid regurgitation, and moderate bony hypertension with a PA pressure 51 mm.  Patient was seen in cardiology consultation.  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  He did not have significant obstructive CAD with only diffuse 30-50% mid and distal RCA stenoses.  There is moderate pulmonary hypertension.  He was discharged on 02/14/2017 and has been on lisinopril 5 mg, furosemide 40 mg, carvedilol 6.25 mg twice a day in addition to aspirin 81 mg, supplemental potassium 20, now quit once and atorvastatin 40 mg.    I saw for initial post Kelly evaluation on 03/11/2017.  I reviewed his Kelly data in its entirety.  He was feeling improved on his initial medical regimen and I recommended further titration of his lisinopril up to 10 mg and increase carvedilol to 12.5 mg twice a day.  He did not have insurance and ws working on trying to obtain a  Medicaid card.  We talked in the future about possibly switching him to entresto.  On his increase regimen, he has felt improved.    When I saw him in June 2018 his blood pressure continued to be elevated and I recommended the addition of spironolactone at 12.5 mg for one week and then to increase this to 12.5 mg twice a day.  I also titrated lisinopril from 10 mg to 15 mg. On July 17, 2017 he presented to the [facility] with complaints of diplopia and visual disturbance. 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  An MRI of the range, did not show acute abnormality but revealed chronic ischemic microangiopathy.  He had a negative CTA, except for minimal calcified atherosclerosis involving only the carotids.  During his hospitalization.  A repeat echo Doppler study showed an EF of 30% with grade 1 diastolic dysfunction.  His aortic root was mildly dilated.  Neurology was consulted and he was felt to have had a likely CVA given his reduced LV function.  He underwent outpatient neurologic rehab.  When I saw him in October 2018 blood pressure was still elevated and I further titrated lisinopril to 20 mg.  He was also taking Spironolactone 12.5 mg twice a day, furosemide 40 mg daily, carvedilol 12.5 twice daily in addition to atorvastatin 80 mg and Plavix and aspirin.  When I saw him in April 2019 he had  run out of his lisinopril for the last 2 months.  His allergist had increase aspirin to 325 and he had continued to be on Plavix, carvedilol 12.5 twice a day, furosemide 40 mg and HCTZ 25 mg.  He had lost 35 pounds since October 2018.  He was no longer taking Spironolactone.  With his reduced EF I felt he was a good candidate for initiation of Entresto.  He was gradually been titrated up to maximum dose 97/103 and  saw [facility] [place] in May 2019 at which time the dose was titrated to maximum.  Apparently he took that dose for a  month since he had a 30-day free samples but unfortunately when [facility] the [facility] were unable to fill it since he had no insurance.  When I last saw him on July 26, 2018 he was restarted with samples of and on Entresto at 49/51 mg.  At the time he also was on carvedilol 12.5 mg twice a day, furosemide 60 mg daily without edema.  He was on atorvastatin 80 mg for hyperlipidemia.    I him in January 2020 and over the past several months prior to that evaluation he  felt improved with reinitiation of Entresto therapy.  At times he still noted some abdominal bloating.  He was  on carvedilol 12.5 twice a day, clonidine 0.1 mg twice a day, furosemide 60 mg daily and apparently HCTZ 25 mg.  During that evaluation, he had no signs of edema and appears euvolemic on exam.  So that I could further titrate Entresto back to maximum dosing, I suggested he discontinue HCTZ and also suggested he decrease Lasix.  For several weeks I also recommended slight additional titration of carvedilol.  At his office visit  in August 2020 he was no longer taking Entresto since his samples had run out over 3 months previously.  As result he noticed recurrent symptomatology with shortness of breath.  During that evaluation, I provided him with samples of Entresto 24/26 for him to take twice a day for 2 weeks and gave him another prescription for 49/51 for titration.  He was scheduled to see his pharmacist for follow-up evaluation.  I saw him on January 06, 2021 and prior to that evaluation I had not seen him since  August 2020 but he had been evaluated by [name] subsequent  to my evaluation.  At his March 2022 evaluation he was no longer on Entresto and has been on carvedilol 12.5 mg twice a day, furosemide 80 mg daily, losartan 50 mg daily.  He apparently has been taking supplemental potassium, clonidine 0.1 mg twice a day and continues to be on atorvastatin 80 mg for hyperlipidemia and Metformin  500 mg twice a day for diabetes mellitus.  He is followed by Dr. Tomi Likens for his multiple sclerosis.  Recent LDL level was 104.  During that evaluation, his blood pressure was elevated.  I recommended he discontinue supplemental potassium and initiated spironolactone 12.5 mg daily.  He continued to be on Jardiance 10 mg and was tolerating this well.  Recommended that he have a follow-up echo Doppler study for reassessment of LV function.  [name] underwent a follow-up echo Doppler study on April 09, 2021.  EF was improved now in the low normal range at 50 to 55% with frequent PVCs noted throughout the study.  There was mild LVH.  He had hyperdynamic RV function.  Compared to his prior study in 2018 he has  had increased from 20 to 25%.  I saw him on May 12, 2021 at which time he was having frequent  urination and was taking furosemide 80 mg daily, Jardiance 10 mg, spironolactone 12.5 mg daily, losartan 50 mg, in addition to clonidine 0.1 mg twice a day and carvedilol 12.5 mg twice a day.  He continues to be on atorvastatin 80 mg.  He now had insurance.  He admits to weight gain.  During that evaluation since he now had insurance with [facility] [employer] I recommended reinstitution of Entresto and discontinuance of losartan.  I provided him with samples of Entresto 49/51 mg twice a day.  With his increased heart rate in the 80s and frequent PVCs I also recommended titration of carvedilol from 12.5 mg twice a day to 18.7 mg twice a day for the next week with then further increasing to 25 mg twice a day.  He continues to be on atorvastatin for hyperlipidemia.  Since my prior evaluation he was seen by our pharmacist, Rollen Sox, [place] on 2 occasions with titration of Entresto to 97/103 mg twice a day.  I last saw him on July 30, 2021 he continues to have issues with obesity and blood pressure has been elevated.  He had undergone laboratory by Dr. Tomi Likens and he was started on supplemental over-the-counter  vitamin D at just 4000 units daily, although his vitamin D level was significantly depressed at 11.  He denies any chest pain.  He denies presyncope or syncope.  During that evaluation, he had obtained insurance and was back on Entresto dosing at 97/103 mg twice a day.  Spironolactone was further titrated to 12.5 mg twice a day with continued blood pressure elevation.  Upon further questioning, he does admit to poor sleep, has frequent awakenings, snoring, and daytime somnolence.  I recommended a sleep evaluation with high suspicion for obstructive sleep apnea.  He underwent a sleep study on October 13, 2021 and met criteria for split-night evaluation.  He had severe obstructive sleep apnea during the diagnostic portion with an AHI of 85.5 and RDI of 88.7.  This CPAP was initiated at 6 cm and was titrated to 13 cm of water.  He received a new ResMed AirSense 11 CPAP machine on December 02, 2021.  On his initial download from February 7 through December 31, 2021 he met compliance with 100% use.  Average use was 5 hours and 28 minutes.  On subsequent download, average use increased to 6 hours and 36 minutes from May 27 through June 18, 2022.  AHI is 2.3 at 13 cm water pressure.  However, he showed me his download from last night June 18, 2022 and AHI had not increased to 6.4.  He typically tries to go to bed by 9 PM and often wakes up at 5 AM.  Presently, he denies any chest pain.  He has had difficulty with his insurance and has not been on Entresto for the last 2 months.  He continues to be on Jardiance 10 mg, furosemide 80 mg daily, spironolactone but I am not certain if he is still taking 12.5 or 25 mg.  He also takes clonidine 0.2 mg twice a day.  He is on atorvastatin 80 mg and Zetia 10 mg for hyperlipidemia.  He is diabetic on metformin in addition to his Jardiance.  He presents for evaluation.  Past Medical History:  Diagnosis Date   Acute CHF (congestive heart failure) ([place]) 02/10/2017   Dyspnea     Hypertension  Stroke [facility]     Past Surgical History:  Procedure Laterality Date   NO PAST SURGERIES     RIGHT/LEFT HEART CATH AND CORONARY ANGIOGRAPHY N/A 02/12/2017   Procedure: Right/Left Heart Cath and Coronary Angiography;  Surgeon: Belva Crome, MD;  Location: [facility];  Service: Cardiovascular;  Laterality: N/A;    Current Medications: Outpatient Medications Prior to Visit  Medication Sig Dispense Refill   furosemide (LASIX) 80 MG tablet Take 1 tablet (80 mg total) by mouth daily. 90 tablet 1   Accu-Chek Softclix Lancets lancets Use as instructed three times daily before meals 100 each 12   aspirin 81 MG tablet Take 1 tablet (81 mg total) by mouth daily. 30 tablet 3   atorvastatin (LIPITOR) 80 MG tablet Take 1 tablet (80 mg total) by mouth daily at 6 PM. 90 tablet 1   blood glucose meter kit and supplies KIT Use up to four times daily as directed. 1 each 0   Blood Glucose Monitoring Suppl (TRUE METRIX METER) w/Device KIT Use as instructed to check blood sugar BID. 1 kit 0   carvedilol (COREG) 25 MG tablet Take 1 tablet (25 mg total) by mouth 2 (two) times daily with a meal. 180 tablet 1   Cholecalciferol (VITAMIN D3) 1.25 MG (50000 UT) TABS Take 50,000 Units by mouth once a week. 4 tablet 5   cloNIDine (CATAPRES) 0.2 MG tablet Take 1 tablet (0.2 mg total) by mouth 2 (two) times daily. 180 tablet 3   empagliflozin (JARDIANCE) 10 MG TABS tablet TAKE 1 TABLET BY MOUTH ONCE DAILY BEFORE BREAKFAST 90 tablet 3   empagliflozin (JARDIANCE) 10 MG TABS tablet Take 1 tablet (10 mg total) by mouth daily before breakfast. 90 tablet 0   ergocalciferol (VITAMIN D2) 1.25 MG (50000 UT) capsule Take 1 capsule (50,000 Units total) by mouth once a week. 4 capsule 5   ezetimibe (ZETIA) 10 MG tablet Take 1 tablet (10 mg total) by mouth daily. 90 tablet 1   glucose blood (TRUE METRIX BLOOD GLUCOSE TEST) test strip use as directed 100 strip 12   metFORMIN (GLUCOPHAGE) 1000 MG tablet Take 1  tablet (1,000 mg total) by mouth 2 (two) times daily with a meal. 180 tablet 1   ocrelizumab (OCREVUS) 300 MG/10ML injection Inject 600 mg into the vein once.     sacubitril-valsartan (ENTRESTO) 49-51 MG Take 1 tablet by mouth 2 (two) times daily. 60 tablet 11   spironolactone (ALDACTONE) 25 MG tablet Take 0.5 tablets (12.5 mg total) by mouth daily. 45 tablet 3   TRUEplus Lancets 28G MISC Use to check blood sugar twice daily. 100 each 6   No facility-administered medications prior to visit.     Allergies:   Patient has no known allergies.   Social History   Socioeconomic History   Marital status: Divorced    Spouse name: Not on file   Number of children: 4   Years of education: Not on file   Highest education level: High school graduate  Occupational History   Not on file  Tobacco Use   Smoking status: Former    Packs/day: 1.00    Years: 34.00    Total pack years: 34.00    Types: Cigarettes    Quit date: 06/28/2016    Years since quitting: 6.3   Smokeless tobacco: Never  Vaping Use   Vaping Use: Never used  Substance and Sexual Activity   Alcohol use: Not Currently    Comment: stopped in  2018   Drug use: Yes    Comment: 02/10/2017 "experimented w/drugs; haven't done any for years"   Sexual activity: Not Currently  Other Topics Concern   Not on file  Social History Narrative   Lives in one story home with daughter; Left handed; some college; caffeine - occasional coffee not daily; walks 2xweek   Social Determinants of Health   Financial Resource Strain: Medium Risk (11/03/2021)   Overall Financial Resource Strain (CARDIA)    Difficulty of Paying Living Expenses: Somewhat hard  Food Insecurity: No Food Insecurity (11/03/2021)   Hunger Vital Sign    Worried About Running Out of Food in the Last Year: Never true    Ran Out of Food in the Last Year: Never true  Transportation Needs: No Transportation Needs (11/03/2021)   PRAPARE - Hydrologist  (Medical): No    Lack of Transportation (Non-Medical): No  Physical Activity: Not on file  Stress: Not on file  Social Connections: Not on file     Family History:  The patient's family history includes Cardiomyopathy in his mother; Hypertension in his mother; Multiple sclerosis in his sister; Stroke in his maternal aunt.   ROS General: Negative; No fevers, chills, or night sweats;  HEENT: positive for recent visual disturbance. sinus congestion, difficulty swallowing Pulmonary: Negative; No cough, wheezing, shortness of breath, hemoptysis Cardiovascular: See HPI GI: Negative; No nausea, vomiting, diarrhea, or abdominal pain GU: Negative; No dysuria, hematuria, or difficulty voiding Musculoskeletal: Negative; no myalgias, joint pain, or weakness Hematologic/Oncology: Negative; no easy bruising, bleeding Endocrine: Negative; no heat/cold intolerance; no diabetes Neuro: positive for previous diplopia Skin: Negative; No rashes or skin lesions Psychiatric: Negative; No behavioral problems, depression Sleep: snoring, daytime sleepiness, hypersomnolence, no bruxism, restless legs, hypnogognic hallucinations, no cataplexy Other comprehensive 14 point system review is negative.   PHYSICAL EXAM:   VS:  There were no vitals taken for this visit.    Repeat blood pressure by me was 126/82  Wt Readings from Last 3 Encounters:  08/13/22 (!) 304 lb (137.9 kg)  06/19/22 (!) 307 lb 3.2 oz (139.3 kg)  06/18/22 (!) 303 lb (137.4 kg)     Physical Exam There were no vitals taken for this visit. General: Alert, oriented, no distress.  Skin: normal turgor, no rashes, warm and dry HEENT: Normocephalic, atraumatic. Pupils equal round and reactive to light; sclera anicteric; extraocular muscles intact; Fundi ** Nose without nasal septal hypertrophy Mouth/Parynx benign; Mallinpatti scale Neck: No JVD, no carotid bruits; normal carotid upstroke Lungs: clear to ausculatation and percussion; no  wheezing or rales Chest wall: without tenderness to palpitation Heart: PMI not displaced, RRR, s1 s2 normal, 1/6 systolic murmur, no diastolic murmur, no rubs, gallops, thrills, or heaves Abdomen: soft, nontender; no hepatosplenomehaly, BS+; abdominal aorta nontender and not dilated by palpation. Back: no CVA tenderness Pulses 2+ Musculoskeletal: full range of motion, normal strength, no joint deformities Extremities: no clubbing cyanosis or edema, Homan's sign negative  Neurologic: grossly nonfocal; Cranial nerves grossly wnl Psychologic: Normal mood and affect    General: Alert, oriented, no distress.  Morbid obesity with BMI 46.7 Skin: normal turgor, no rashes, warm and dry HEENT: Normocephalic, atraumatic. Pupils equal round and reactive to light; sclera anicteric; extraocular muscles intact;  Nose without nasal septal hypertrophy Mouth/Parynx benign; Mallinpatti scale 3/4 Neck: Thick neck no JVD, no carotid bruits; normal carotid upstroke Lungs: clear to ausculatation and percussion; no wheezing or rales Chest wall: without tenderness to palpitation Heart:  PMI not displaced, RRR, s1 s2 normal, 1/6 systolic murmur, no diastolic murmur, no rubs, gallops, thrills, or heaves Abdomen: Central adiposity soft, nontender; no hepatosplenomehaly, BS+; abdominal aorta nontender and not dilated by palpation. Back: no CVA tenderness Pulses 2+ Musculoskeletal: full range of motion, normal strength, no joint deformities Extremities: no clubbing cyanosis or edema, Homan's sign negative  Neurologic: grossly nonfocal; Cranial nerves grossly wnl Psychologic: Normal mood and affect   Studies/Labs Reviewed:   November 05, 2022 ECG (independently read by me):   June 19, 2022 ECG (independently read by me): Sinus rhythm with PVCs; QTc 499 msec  May 12, 2021 ECG (independently read by me): Normal sinus rhythm at 80 bpm, sinus arrhythmia, PVC, Right branch block, left ventricular hypertrophy and  left axis deviation. A repeat ECG was also performed which showed sinus rhythm at 84 with frequent PVCs, possible left atrial enlargement, left axis deviation, right bundle branch block, and LVH.  January 06, 2021 ECG (independently read by me): Sinus rhythm at 87, PVCs, RBBB, LAHB, LVH  June 15, 2019 ECG (independently read by me): NSR at 64; RBBB  November 22, 2018 ECG (independently read by me): Sinus rhythm with occasional PACs as well as PVC.  Left axis deviation.  Right bundle branch block.  LVH by voltage.  July 26, 2018 ECG (independently read by me): Sinus bradycardia at 53 bpm.  Right bundle branch block. LVH by voltage; normal intervals  February 20, 2018 ECG (independently read by me): Normal sinus rhythm at 74 bpm.  Left axis deviation.  Right bundle branch block.  LVH by voltage criteria.  QTc interval 492 ms.  August 18 2017 ECG (independently read by me): Normal sinus rhythm at 66 bpm.  Right bundle branch block with repolarization changes.  Inferolateral T change.  T-wave changes.  LVH.  QTc interval 461 ms.  04/21/2017 EKG:  EKG is ordered today. Normal sinus rhythm at 77 bpm.  LVH with repolarization changes.  PR interval 184 ms.  QTc interval 488 ms.  03/11/2017 ECG (independently read by me): Normal sinus rhythm at 73 bpm.  Left axis deviation.  LVH by voltage criteria with repolarization changes.  Recent Labs:    Latest Ref Rng & Units 08/13/2022    9:08 AM 02/11/2022    9:12 AM 12/18/2021    2:21 PM  BMP  Glucose 70 - 99 mg/dL 118  103  158   BUN 6 - 23 mg/dL 12  16  15   $ Creatinine 0.40 - 1.50 mg/dL 0.87  0.90  0.99   BUN/Creat Ratio 9 - 20   15   Sodium 135 - 145 mEq/L 140  142  145   Potassium 3.5 - 5.1 mEq/L 4.0  4.3  4.4   Chloride 96 - 112 mEq/L 105  105  104   CO2 19 - 32 mEq/L 28  29  23   $ Calcium 8.4 - 10.5 mg/dL 9.3  8.9  9.6         Latest Ref Rng & Units 08/13/2022    9:08 AM 02/11/2022    9:12 AM 06/20/2021   11:17 AM  Hepatic Function  Total  Protein 6.0 - 8.3 g/dL 7.7  7.3  8.2   Albumin 3.5 - 5.2 g/dL 4.2  4.1  4.1   AST 0 - 37 U/L 16  11  18   $ ALT 0 - 53 U/L 12  13  18   $ Alk Phosphatase 39 - 117 U/L 59  61  58   Total Bilirubin 0.2 - 1.2 mg/dL 0.7  0.6  0.7        Latest Ref Rng & Units 08/13/2022    9:08 AM 02/11/2022    9:12 AM 06/20/2021   11:17 AM  CBC  WBC 4.0 - 10.5 K/uL 8.2  7.7  9.0   Hemoglobin 13.0 - 17.0 g/dL 13.1  12.8  13.1   Hematocrit 39.0 - 52.0 % 39.7  39.1  40.0   Platelets 150.0 - 400.0 K/uL 288.0  260.0  307.0    Lab Results  Component Value Date   MCV 94.0 08/13/2022   MCV 95.2 02/11/2022   MCV 94.0 06/20/2021   Lab Results  Component Value Date   TSH 3.140 05/19/2021   Lab Results  Component Value Date   HGBA1C 5.7 (H) 06/18/2022     BNP    Component Value Date/Time   BNP 7.2 12/18/2019 1556   BNP 752.4 (H) 02/10/2017 1525    ProBNP    Component Value Date/Time   PROBNP 15 05/19/2021 0819     Lipid Panel     Component Value Date/Time   CHOL 205 (H) 05/19/2021 0819   TRIG 121 05/19/2021 0819   HDL 36 (L) 05/19/2021 0819   CHOLHDL 5.7 (H) 05/19/2021 0819   CHOLHDL 4.9 07/18/2017 0558   VLDL 11 07/18/2017 0558   LDLCALC 147 (H) 05/19/2021 0819     RADIOLOGY: No results found.   Additional studies/ records that were reviewed today include:  I reviewed the patient's recent hospitalization, his cardiac consultation, cardiac catheterization report, and hospitalist records. I reviewed his hospitalization from September 22 through 07/19/2017 including imaging studies and echocardiogram.  ECHO 04/09/2021 IMPRESSIONS   1. Heart rate varied during the study - possible atrial arrythmias and  frequent PVC's were noted, also making LVEF determination difficult. Left  ventricular ejection fraction, by estimation, is 50 to 55%. The left  ventricle has low normal function. The  left ventricle has no regional wall motion abnormalities. There is mild  left ventricular  hypertrophy. Indeterminate diastolic filling due to E-A  fusion.   2. Right ventricular systolic function is hyperdynamic. The right  ventricular size is normal.   3. The mitral valve is grossly normal. Trivial mitral valve  regurgitation.   4. The aortic valve is tricuspid. Aortic valve regurgitation is not  visualized.   Comparison(s): Prior images unable to be directly viewed, comparison made  by report only. Changes from prior study are noted. 07/18/2017: LVEF  20-25%.   ASSESSMENT:    No diagnosis found.    PLAN:  [name] is a 57 year-old African-American gentleman who has a history of morbid obesity and has documented reduced EF noted in April 2018.  He has been an excellent candidate for [facility] and previously had been uptitrated to 97/103 twice daily with significant improvement but unfortunately had stopped therapy when he could not get additional medication.  He  was able to be restarted back on Entresto and  was titrated to 49/51 and later 97/103 mm successfully.  He again stopped using Entresto when his samples ran out which he believes may have been sometime in March or April 2020.  When I saw him in August 2020 I again reinstituted therapy.  When most recently seen in March 2022, he was no longer taking Entresto unfortunately due to cost issues and not having insurance.  He gained significant weight up to 335 when last evaluated in July 2022.  He is subsequently obtained insurance with [employer] and has been successfully titrated back to maximum Entresto dosing at 97/103 mg twice daily.  He also has been titrated to carvedilol 25 mg twice a day and has been on Jardiance 10 mg and spironolactone 12.5 mg daily.  At his last evaluation with me in October 2022 he was on Entresto and with blood pressure elevation I further titrated spironolactone to 12.5 mg twice a day which should offer benefit in his blood pressure as well.  He was being treated by Dr. Tomi Likens for vitamin D  deficiency.  During my evaluation he also had significant sleep disturbance and I recommended he undergo a sleep evaluation.  He was found to have severe obstructive sleep apnea on a split-night protocol with AHI at 85.5/h.  He had severe oxygen desaturation to a nadir of 70% with time spent below 88% at approximately 55 minutes.  He has now been on CPAP therapy since his set up date on December 02, 2021 and is meeting compliance standards.  I again spent time with him today discussing the importance of continued therapy and the potential adverse cardiovascular consequences of untreated sleep apnea regarding both blood pressure, nocturnal arrhythmias, increased incidence of atrial fibrillation, effects on glucose/insulin resistance, inflammation, GERD, and potential for nocturnal hypoxemia contributing to ischemia.  I will change his CPAP settings from a set pressure of 13 to a auto mode with a range of 13 to 18 cm of water.  With reference to his cardiomyopathy, unfortunately he has been off Entresto for approximately 2 months.  I had Levy Sjogren meet with him today in the office who verified that actually he does have insurance which should cover his Entresto.  He was given samples of 49/51 mg twice a day and told to reinstitute therapy and obtain his new prescription.  I am recommending in 2 to 3 months he undergo a follow-up echo Doppler study for reassessment of LV function following reinstitution of treatment.  He continues to be on atorvastatin 80 mg and Zetia 10 mg for hyperlipidemia.  He sees Dr. Nelly Laurence for primary care who will be checking laboratory.  I will see him in 4 months for reevaluation.   Medication Adjustments/Labs and Tests Ordered: Current medicines are reviewed at length with the patient today.  Concerns regarding medicines are outlined above.  Medication changes, Labs and Tests ordered today are listed in the Patientt Instructions below. There are no Patient Instructions on file  for this visit.   Signed, [name], MD , [facility] 10/30/2022 6:09 PM    [facility] [address] [place], [place], [place]  [address] Phone: [phone]

## 2022-11-04 ENCOUNTER — Telehealth: Payer: Self-pay | Admitting: Family Medicine

## 2022-11-04 NOTE — Telephone Encounter (Signed)
Left message for patient to call back and schedule Medicare Annual Wellness Visit (AWV) either virtually or phone  . Left  my Herbie Drape number [phone]   awvi 10/26/22 per [facility]

## 2022-11-05 ENCOUNTER — Ambulatory Visit: Payer: Medicare Other | Admitting: Cardiovascular Disease

## 2022-11-18 ENCOUNTER — Telehealth: Payer: Self-pay

## 2022-11-18 ENCOUNTER — Telehealth: Payer: Self-pay | Admitting: Neurology

## 2022-11-18 NOTE — Telephone Encounter (Signed)
[facility] called in to schedule a delivery of Ocrevus to our office.

## 2022-11-18 NOTE — Telephone Encounter (Signed)
Called [facility] and let them know that the Ocrevus needs to be sent to the patients home and not our office. They instructed me that the infusion company or the patient needs to call and schedule the delivery

## 2022-11-18 NOTE — Telephone Encounter (Signed)
[place] was calling asking the patient to call her back regarding his orcrevus. I have left message on patients machine to have him call her at [phone] and to call us if he has any questions.

## 2022-11-19 ENCOUNTER — Ambulatory Visit (INDEPENDENT_AMBULATORY_CARE_PROVIDER_SITE_OTHER): Payer: Medicare Other

## 2022-11-19 VITALS — Ht 68.0 in | Wt 315.0 lb

## 2022-11-19 DIAGNOSIS — Z0000 Encounter for general adult medical examination without abnormal findings: Secondary | ICD-10-CM | POA: Diagnosis not present

## 2022-11-19 NOTE — Progress Notes (Signed)
I connected with Nicholas Kelly today by telephone and verified that I am speaking with the correct person using two identifiers. Location patient: home Location provider: work Persons participating in the virtual visit: Nicholas Kelly.   I discussed the limitations, risks, security and privacy concerns of performing an evaluation and management service by telephone and the availability of in person appointments. I also discussed with the patient that there may be a patient responsible charge related to this service. The patient expressed understanding and verbally consented to this telephonic visit.    Interactive audio and video telecommunications were attempted between this provider and patient, however failed, due to patient having technical difficulties OR patient did not have access to video capability.  We continued and completed visit with audio only.     Vital signs may be patient reported or missing.   Subjective:   Nicholas Kelly. is a 57 y.o. male who presents for an Initial Medicare Annual Wellness Visit.  Review of Systems     Cardiac Risk Factors include: advanced age (>42men, >12 women);diabetes mellitus;dyslipidemia;hypertension;male gender;obesity (BMI >30kg/m2)     Objective:    Today's Vitals   11/19/22 1556  Weight: (!) 315 lb (142.9 kg)  Height: 5\' 8"  (1.727 m)   Body mass index is 47.9 kg/m.     11/19/2022    4:01 PM 02/11/2022    8:29 AM 10/13/2021    8:58 PM 06/20/2021   10:34 AM 12/13/2020    2:40 PM 11/21/2020   10:59 AM 10/09/2019   12:35 PM  Advanced Directives  Does Patient Have a Medical Advance Directive? Yes Yes No No No No Yes  Type of Paramedic of [place];Living will [place];Living will;Out of facility DNR (pink MOST or yellow form)     [place] in Chart? No - copy requested        Would patient like  information on creating a medical advance directive?   No - Patient declined        Current Medications (verified) Outpatient Encounter Medications as of 11/19/2022  Medication Sig   Accu-Chek Softclix Lancets lancets Use as instructed three times daily before meals   aspirin 81 MG tablet Take 1 tablet (81 mg total) by mouth daily.   atorvastatin (LIPITOR) 80 MG tablet Take 1 tablet (80 mg total) by mouth daily at 6 PM.   blood glucose meter kit and supplies KIT Use up to four times daily as directed.   Blood Glucose Monitoring Suppl (TRUE METRIX METER) w/Device KIT Use as instructed to check blood sugar BID.   carvedilol (COREG) 25 MG tablet Take 1 tablet (25 mg total) by mouth 2 (two) times daily with a meal.   Cholecalciferol (VITAMIN D3) 1.25 MG (50000 UT) TABS Take 50,000 Units by mouth once a week.   cloNIDine (CATAPRES) 0.2 MG tablet Take 1 tablet (0.2 mg total) by mouth 2 (two) times daily.   empagliflozin (JARDIANCE) 10 MG TABS tablet TAKE 1 TABLET BY MOUTH ONCE DAILY BEFORE BREAKFAST   empagliflozin (JARDIANCE) 10 MG TABS tablet Take 1 tablet (10 mg total) by mouth daily before breakfast.   ergocalciferol (VITAMIN D2) 1.25 MG (50000 UT) capsule Take 1 capsule (50,000 Units total) by mouth once a week.   ezetimibe (ZETIA) 10 MG tablet Take 1 tablet (10 mg total) by mouth daily.   furosemide (LASIX) 80 MG tablet Take  1 tablet (80 mg total) by mouth daily.   glucose blood (TRUE METRIX BLOOD GLUCOSE TEST) test strip use as directed   metFORMIN (GLUCOPHAGE) 1000 MG tablet Take 1 tablet (1,000 mg total) by mouth 2 (two) times daily with a meal.   ocrelizumab (OCREVUS) 300 MG/10ML injection Inject 600 mg into the vein once.   sacubitril-valsartan (ENTRESTO) 49-51 MG Take 1 tablet by mouth 2 (two) times daily.   spironolactone (ALDACTONE) 25 MG tablet Take 0.5 tablets (12.5 mg total) by mouth daily.   TRUEplus Lancets 28G MISC Use to check blood sugar twice daily.   No  facility-administered encounter medications on file as of 11/19/2022.    Allergies (verified) Patient has no known allergies.   History: Past Medical History:  Diagnosis Date   Acute CHF (congestive heart failure) (HCC) 02/10/2017   Dyspnea    Hypertension    Stroke [facility])    Past Surgical History:  Procedure Laterality Date   NO PAST SURGERIES     RIGHT/LEFT HEART CATH AND CORONARY ANGIOGRAPHY N/A 02/12/2017   Procedure: Right/Left Heart Cath and Coronary Angiography;  Surgeon: Lyn Records, MD;  Location: [facility] INVASIVE CV LAB;  Service: Cardiovascular;  Laterality: N/A;   Family History  Problem Relation Age of Onset   Hypertension Mother    Cardiomyopathy Mother    Stroke Maternal Aunt    Multiple sclerosis Sister    Social History   Socioeconomic History   Marital status: Divorced    Spouse name: Not on file   Number of children: 4   Years of education: Not on file   Highest education level: High school graduate  Occupational History   Not on file  Tobacco Use   Smoking status: Former    Packs/day: 1.00    Years: 34.00    Total pack years: 34.00    Types: Cigarettes    Quit date: 06/28/2016    Years since quitting: 6.3   Smokeless tobacco: Never  Vaping Use   Vaping Use: Never used  Substance and Sexual Activity   Alcohol use: Not Currently    Comment: stopped in 2018   Drug use: Not Currently    Comment: 02/10/2017 "experimented w/drugs; haven't done any for years"   Sexual activity: Not Currently  Other Topics Concern   Not on file  Social History Narrative   Lives in one story home with daughter; Left handed; some college; caffeine - occasional coffee not daily; walks 2xweek   Social Determinants of Health   Financial Resource Strain: Low Risk  (11/19/2022)   Overall Financial Resource Strain (CARDIA)    Difficulty of Paying Living Expenses: Not hard at all  Food Insecurity: No Food Insecurity (11/19/2022)   Hunger Vital Sign    Worried About Running  Out of Food in the Last Year: Never true    Ran Out of Food in the Last Year: Never true  Transportation Needs: No Transportation Needs (11/19/2022)   PRAPARE - Administrator, Civil Service (Medical): No    Lack of Transportation (Non-Medical): No  Physical Activity: Inactive (11/19/2022)   Exercise Vital Sign    Days of Exercise per Week: 0 days    Minutes of Exercise per Session: 0 min  Stress: No Stress Concern Present (11/19/2022)   [employer] of Occupational Health - Occupational Stress Questionnaire    Feeling of Stress : Not at all  Social Connections: Not on file    Tobacco Counseling Counseling given: Not Answered  Clinical Intake:  Pre-visit preparation completed: Yes  Pain : No/denies pain     Nutritional Status: BMI > 30  Obese Nutritional Risks: None Diabetes: Yes  How often do you need to have someone help you when you read instructions, pamphlets, or other written materials from your doctor or pharmacy?: 1 - Never What is the last grade level you completed in school?: some college  Diabetic? Yes Nutrition Risk Assessment:  Has the patient had any N/V/D within the last 2 months?  No  Does the patient have any non-healing wounds?  No  Has the patient had any unintentional weight loss or weight gain?  No   Diabetes:  Is the patient diabetic?  Yes  If diabetic, was a CBG obtained today?  No  Did the patient bring in their glucometer from home?  No  How often do you monitor your CBG's? daily.   Financial Strains and Diabetes Management:  Are you having any financial strains with the device, your supplies or your medication? No .  Does the patient want to be seen by Chronic Care Management for management of their diabetes?  No  Would the patient like to be referred to a Nutritionist or for Diabetic Management?  No   Diabetic Exams:  Diabetic Eye Exam: Overdue for diabetic eye exam. Pt has been advised about the importance in  completing this exam. Patient advised to call and schedule an eye exam. Diabetic Foot Exam: Overdue, Pt has been advised about the importance in completing this exam. Pt is scheduled for diabetic foot exam on next appointment.   Interpreter Needed?: No  Information entered by :: NAllen Kelly   Activities of Daily Living    11/19/2022    4:03 PM  In your present state of health, do you have any difficulty performing the following activities:  Hearing? 0  Vision? 0  Difficulty concentrating or making decisions? 0  Walking or climbing stairs? 1  Comment due to MS  Dressing or bathing? 0  Doing errands, shopping? 0  Preparing Food and eating ? N  Using the Toilet? N  In the past six months, have you accidently leaked urine? N  Do you have problems with loss of bowel control? N  Managing your Medications? N  Managing your Finances? N  Housekeeping or managing your Housekeeping? N    Patient Care Team: Georganna Skeans, MD as PCP - General (Family Medicine) Lennette Bihari, MD as PCP - Cardiology (Cardiology) Lennette Bihari, MD as PCP - Sleep Medicine (Cardiology) Drema Dallas, DO as Consulting Physician (Neurology)  Indicate any recent Medical Services you may have received from other than [facility] providers in the past year (date may be approximate).     Assessment:   This is a routine wellness examination for Leonard.  Hearing/Vision screen Vision Screening - Comments:: Regular eye exams, Dr. Sharee Holster  Dietary issues and exercise activities discussed: Current Exercise Habits: The patient has a physically strenuous job, but has no regular exercise apart from work.   Goals Addressed             This Visit's Progress    Patient Stated       11/19/2022, wants to lose weight       Depression Screen    11/19/2022    4:03 PM 06/18/2022    8:47 AM 12/18/2021    9:20 AM 03/26/2021    3:26 PM 11/21/2020   10:58 AM 08/22/2020    3:20 PM 07/13/2019  10:57 AM  PHQ 2/9 Scores   PHQ - 2 Score 0 0 0 0 0 0 2  PHQ- 9 Score  0 1 1 0  11    Fall Risk    11/19/2022    4:02 PM 02/11/2022    8:29 AM 06/20/2021   10:34 AM 12/13/2020    2:40 PM 11/21/2020   10:58 AM  Fall Risk   Falls in the past year? 0 0 0 0 0  Number falls in past yr: 0 0 0 0 0  Injury with Fall? 0 0 0 0 0  Risk for fall due to : Medication side effect      Follow up Falls prevention discussed;Education provided;Falls evaluation completed        FALL RISK PREVENTION PERTAINING TO THE HOME:  Any stairs in or around the home? Yes  If so, are there any without handrails? No  Home free of loose throw rugs in walkways, pet beds, electrical cords, etc? Yes  Adequate lighting in your home to reduce risk of falls? Yes   ASSISTIVE DEVICES UTILIZED TO PREVENT FALLS:  Life alert? No  Use of a cane, walker or w/c? No  Grab bars in the bathroom? No  Shower chair or bench in shower? No  Elevated toilet seat or a handicapped toilet? No   TIMED UP AND GO:  Was the test performed? No .       Cognitive Function:        11/19/2022    4:05 PM  6CIT Screen  What Year? 0 points  What month? 0 points  What time? 0 points  Count back from 20 0 points  Months in reverse 0 points  Repeat phrase 0 points  Total Score 0 points    Immunizations Immunization History  Administered Date(s) Administered   Influenza, Seasonal, Injecte, Preservative Fre 10/07/2021   Influenza,inj,Quad PF,6+ Mos 07/13/2019, 08/22/2020   Moderna Sars-Covid-2 Vaccination 11/26/2020, 12/25/2020    TDAP status: Due, Education has been provided regarding the importance of this vaccine. Advised may receive this vaccine at local pharmacy or Health Dept. Aware to provide a copy of the vaccination record if obtained from local pharmacy or Health Dept. Verbalized acceptance and understanding.  Flu Vaccine status: Due, Education has been provided regarding the importance of this vaccine. Advised may receive this vaccine at local  pharmacy or Health Dept. Aware to provide a copy of the vaccination record if obtained from local pharmacy or Health Dept. Verbalized acceptance and understanding.  Pneumococcal vaccine status: Up to date  Covid-19 vaccine status: Completed vaccines  Qualifies for Shingles Vaccine? Yes   Zostavax completed No   Shingrix Completed?: No.    Education has been provided regarding the importance of this vaccine. Patient has been advised to call insurance company to determine out of pocket expense if they have not yet received this vaccine. Advised may also receive vaccine at local pharmacy or Health Dept. Verbalized acceptance and understanding.  Screening Tests Health Maintenance  Topic Date Due   Medicare Annual Wellness (AWV)  Never done   OPHTHALMOLOGY EXAM  Never done   Hepatitis C Screening  Never done   DTaP/Tdap/Td (1 - Tdap) Never done   Zoster Vaccines- Shingrix (1 of 2) Never done   COLONOSCOPY (Pts 45-66yrs Insurance coverage will need to be confirmed)  Never done   COLON CANCER SCREENING ANNUAL FOBT  07/16/2020   FOOT EXAM  08/22/2021   Diabetic kidney evaluation - Urine ACR  11/21/2021   Lung Cancer Screening  04/14/2022   INFLUENZA VACCINE  05/26/2022   HEMOGLOBIN A1C  12/19/2022   Diabetic kidney evaluation - eGFR measurement  08/14/2023   HIV Screening  Completed   HPV VACCINES  Aged Out   COVID-19 Vaccine  Discontinued    Health Maintenance  Health Maintenance Due  Topic Date Due   Medicare Annual Wellness (AWV)  Never done   OPHTHALMOLOGY EXAM  Never done   Hepatitis C Screening  Never done   DTaP/Tdap/Td (1 - Tdap) Never done   Zoster Vaccines- Shingrix (1 of 2) Never done   COLONOSCOPY (Pts 45-66yrs Insurance coverage will need to be confirmed)  Never done   COLON CANCER SCREENING ANNUAL FOBT  07/16/2020   FOOT EXAM  08/22/2021   Diabetic kidney evaluation - Urine ACR  11/21/2021   Lung Cancer Screening  04/14/2022   INFLUENZA VACCINE  05/26/2022     Colorectal cancer screening: due  Lung Cancer Screening: (Low Dose CT Chest recommended if Age 24-80 years, 30 pack-year currently smoking OR have quit w/in 15years.) does not qualify.   Lung Cancer Screening Referral: no  Additional Screening:  Hepatitis C Screening: does qualify;   Vision Screening: Recommended annual ophthalmology exams for early detection of glaucoma and other disorders of the eye. Is the patient up to date with their annual eye exam?  No  Who is the provider or what is the name of the office in which the patient attends annual eye exams? Dr. Sharee Holster If pt is not established with a provider, would they like to be referred to a provider to establish care? No .   Dental Screening: Recommended annual dental exams for proper oral hygiene  Community Resource Referral / Chronic Care Management: CRR required this visit?  No   CCM required this visit?  No      Plan:     I have personally reviewed and noted the following in the patient's chart:   Medical and social history Use of alcohol, tobacco or illicit drugs  Current medications and supplements including opioid prescriptions. Patient is not currently taking opioid prescriptions. Functional ability and status Nutritional status Physical activity Advanced directives List of other physicians Hospitalizations, surgeries, and ER visits in previous 12 months Vitals Screenings to include cognitive, depression, and falls Referrals and appointments  In addition, I have reviewed and discussed with patient certain preventive protocols, quality metrics, and best practice recommendations. A written personalized care plan for preventive services as well as general preventive health recommendations were provided to patient.     Barb Merino, Kelly   03/12/6159   Nurse Notes: none  Due to this being a virtual visit, the after visit summary with patients personalized plan was offered to patient via mail or  my-chart.  Patient would like to access on my-chart

## 2022-11-19 NOTE — Patient Instructions (Signed)
[name] , Thank you for taking time to come for your Medicare Wellness Visit. I appreciate your ongoing commitment to your health goals. Please review the following plan we discussed and let me know if I can assist you in the future.   These are the goals we discussed:  Goals      Blood Pressure < 130/80     Patient Stated     11/19/2022, wants to lose weight        This is a list of the screening recommended for you and due dates:  Health Maintenance  Topic Date Due   Eye exam for diabetics  Never done   Hepatitis C Screening: USPSTF Recommendation to screen - Ages 53-79 yo.  Never done   DTaP/Tdap/Td vaccine (1 - Tdap) Never done   Zoster (Shingles) Vaccine (1 of 2) Never done   Colon Cancer Screening  Never done   Stool Blood Test  07/16/2020   Complete foot exam   08/22/2021   Yearly kidney health urinalysis for diabetes  11/21/2021   Screening for Lung Cancer  04/14/2022   Flu Shot  05/26/2022   Hemoglobin A1C  12/19/2022   Yearly kidney function blood test for diabetes  08/14/2023   Medicare Annual Wellness Visit  11/20/2023   HIV Screening  Completed   HPV Vaccine  Aged Out   COVID-19 Vaccine  Discontinued    Advanced directives: Please bring a copy of your POA (Power of Krebs) and/or Living Will to your next appointment.   Conditions/risks identified: none  Next appointment: Follow up in one year for your annual wellness visit   Preventive Care 40-64 Years, Male Preventive care refers to lifestyle choices and visits with your health care provider that can promote health and wellness. What does preventive care include? A yearly physical exam. This is also called an annual well check. Dental exams once or twice a year. Routine eye exams. Ask your health care provider how often you should have your eyes checked. Personal lifestyle choices, including: Daily care of your teeth and gums. Regular physical activity. Eating a healthy diet. Avoiding tobacco and  drug use. Limiting alcohol use. Practicing safe sex. Taking low-dose aspirin every day starting at age 89. What happens during an annual well check? The services and screenings done by your health care provider during your annual well check will depend on your age, overall health, lifestyle risk factors, and family history of disease. Counseling  Your health care provider may ask you questions about your: Alcohol use. Tobacco use. Drug use. Emotional well-being. Home and relationship well-being. Sexual activity. Eating habits. Work and work Statistician. Screening  You may have the following tests or measurements: Height, weight, and BMI. Blood pressure. Lipid and cholesterol levels. These may be checked every 5 years, or more frequently if you are over 71 years old. Skin check. Lung cancer screening. You may have this screening every year starting at age 78 if you have a 30-pack-year history of smoking and currently smoke or have quit within the past 15 years. Fecal occult blood test (FOBT) of the stool. You may have this test every year starting at age 43. Flexible sigmoidoscopy or colonoscopy. You may have a sigmoidoscopy every 5 years or a colonoscopy every 10 years starting at age 10. Prostate cancer screening. Recommendations will vary depending on your family history and other risks. Hepatitis C blood test. Hepatitis B blood test. Sexually transmitted disease (STD) testing. Diabetes screening. This is done by checking  your blood sugar (glucose) after you have not eaten for a while (fasting). You may have this done every 1-3 years. Discuss your test results, treatment options, and if necessary, the need for more tests with your health care provider. Vaccines  Your health care provider may recommend certain vaccines, such as: Influenza vaccine. This is recommended every year. Tetanus, diphtheria, and acellular pertussis (Tdap, Td) vaccine. You may need a Td booster every 10  years. Zoster vaccine. You may need this after age 74. Pneumococcal 13-valent conjugate (PCV13) vaccine. You may need this if you have certain conditions and have not been vaccinated. Pneumococcal polysaccharide (PPSV23) vaccine. You may need one or two doses if you smoke cigarettes or if you have certain conditions. Talk to your health care provider about which screenings and vaccines you need and how often you need them. This information is not intended to replace advice given to you by your health care provider. Make sure you discuss any questions you have with your health care provider. Document Released: 11/08/2015 Document Revised: 07/01/2016 Document Reviewed: 08/13/2015 Elsevier Interactive Patient Education  2017 [place] Prevention in the Home Falls can cause injuries. They can happen to people of all ages. There are many things you can do to make your home safe and to help prevent falls. What can I do on the outside of my home? Regularly fix the edges of walkways and driveways and fix any cracks. Remove anything that might make you trip as you walk through a door, such as a raised step or threshold. Trim any bushes or trees on the path to your home. Use bright outdoor lighting. Clear any walking paths of anything that might make someone trip, such as rocks or tools. Regularly check to see if handrails are loose or broken. Make sure that both sides of any steps have handrails. Any raised decks and porches should have guardrails on the edges. Have any leaves, snow, or ice cleared regularly. Use sand or salt on walking paths during winter. Clean up any spills in your garage right away. This includes oil or grease spills. What can I do in the bathroom? Use night lights. Install grab bars by the toilet and in the tub and shower. Do not use towel bars as grab bars. Use non-skid mats or decals in the tub or shower. If you need to sit down in the shower, use a plastic,  non-slip stool. Keep the floor dry. Clean up any water that spills on the floor as soon as it happens. Remove soap buildup in the tub or shower regularly. Attach bath mats securely with double-sided non-slip rug tape. Do not have throw rugs and other things on the floor that can make you trip. What can I do in the bedroom? Use night lights. Make sure that you have a light by your bed that is easy to reach. Do not use any sheets or blankets that are too big for your bed. They should not hang down onto the floor. Have a firm chair that has side arms. You can use this for support while you get dressed. Do not have throw rugs and other things on the floor that can make you trip. What can I do in the kitchen? Clean up any spills right away. Avoid walking on wet floors. Keep items that you use a lot in easy-to-reach places. If you need to reach something above you, use a strong step stool that has a grab bar. Keep electrical cords out  of the way. Do not use floor polish or wax that makes floors slippery. If you must use wax, use non-skid floor wax. Do not have throw rugs and other things on the floor that can make you trip. What can I do with my stairs? Do not leave any items on the stairs. Make sure that there are handrails on both sides of the stairs and use them. Fix handrails that are broken or loose. Make sure that handrails are as long as the stairways. Check any carpeting to make sure that it is firmly attached to the stairs. Fix any carpet that is loose or worn. Avoid having throw rugs at the top or bottom of the stairs. If you do have throw rugs, attach them to the floor with carpet tape. Make sure that you have a light switch at the top of the stairs and the bottom of the stairs. If you do not have them, ask someone to add them for you. What else can I do to help prevent falls? Wear shoes that: Do not have high heels. Have rubber bottoms. Are comfortable and fit you well. Are closed  at the toe. Do not wear sandals. If you use a stepladder: Make sure that it is fully opened. Do not climb a closed stepladder. Make sure that both sides of the stepladder are locked into place. Ask someone to hold it for you, if possible. Clearly mark and make sure that you can see: Any grab bars or handrails. First and last steps. Where the edge of each step is. Use tools that help you move around (mobility aids) if they are needed. These include: Canes. Walkers. Scooters. Crutches. Turn on the lights when you go into a dark area. Replace any light bulbs as soon as they burn out. Set up your furniture so you have a clear path. Avoid moving your furniture around. If any of your floors are uneven, fix them. If there are any pets around you, be aware of where they are. Review your medicines with your doctor. Some medicines can make you feel dizzy. This can increase your chance of falling. Ask your doctor what other things that you can do to help prevent falls. This information is not intended to replace advice given to you by your health care provider. Make sure you discuss any questions you have with your health care provider. Document Released: 08/08/2009 Document Revised: 03/19/2016 Document Reviewed: 11/16/2014 Elsevier Interactive Patient Education  2017 [employer].

## 2022-11-20 ENCOUNTER — Other Ambulatory Visit: Payer: Self-pay

## 2022-11-20 DIAGNOSIS — G35 Multiple sclerosis: Secondary | ICD-10-CM

## 2022-11-27 ENCOUNTER — Non-Acute Institutional Stay (HOSPITAL_COMMUNITY)
Admission: RE | Admit: 2022-11-27 | Discharge: 2022-11-27 | Disposition: A | Discharge status: Home / Self Care | Payer: Medicare Other | Source: Ambulatory Visit | Attending: Internal Medicine | Admitting: Internal Medicine

## 2022-11-27 DIAGNOSIS — G35 Multiple sclerosis: Secondary | ICD-10-CM | POA: Insufficient documentation

## 2022-11-27 MED ORDER — SODIUM CHLORIDE 0.9 % IV SOLN: INTRAVENOUS | Status: DC | PRN

## 2022-11-27 MED ORDER — SODIUM CHLORIDE 0.9 % IV SOLN
600.0000 mg | INTRAVENOUS | Status: DC
  Administered 2022-11-27: 600 mg via INTRAVENOUS
  Filled 2022-11-27: qty 20

## 2022-11-27 MED ORDER — ACETAMINOPHEN 325 MG PO TABS
650.0000 mg | ORAL_TABLET | Freq: Once | ORAL | Status: AC
  Administered 2022-11-27: 650 mg via ORAL
  Filled 2022-11-27: qty 2

## 2022-11-27 MED ORDER — DIPHENHYDRAMINE HCL 50 MG/ML IJ SOLN
25.0000 mg | Freq: Once | INTRAMUSCULAR | Status: AC
  Administered 2022-11-27: 25 mg via INTRAVENOUS
  Filled 2022-11-27: qty 1

## 2022-11-27 MED ORDER — FAMOTIDINE IN NACL 20-0.9 MG/50ML-% IV SOLN
20.0000 mg | Freq: Once | INTRAVENOUS | Status: AC
  Administered 2022-11-27: 20 mg via INTRAVENOUS
  Filled 2022-11-27: qty 50

## 2022-11-27 MED ORDER — METHYLPREDNISOLONE SODIUM SUCC 125 MG IJ SOLR
100.0000 mg | Freq: Once | INTRAMUSCULAR | Status: AC
  Administered 2022-11-27: 100 mg via INTRAVENOUS
  Filled 2022-11-27: qty 2

## 2022-11-27 NOTE — Progress Notes (Signed)
PATIENT CARE CENTER NOTE  Diagnosis: Multiple sclerosis ([place]) [G35]    Provider: Pieter Partridge, DO   Procedure: De Nurse 600mg   Note: Patient received Ocrevus infusion (dose #1 of 2) via PIV. Pt pre-medicated with IV Benadryl 25 mg, IV Solumedrol 100 mg, IVPB Pepcid 20 mg, and PO Tylenol 650 mg. Pt pre infusion  BP elevated, pt stated that he did not take his BP medication today, pt denies any symptoms. Infusion titrated per protocol. Pt tolerated infusion with no adverse reaction. At end of infusion patients BP remains elevated. RN advised pt to take BP meds as prescribed and to monitor BP and symptoms at home. RN advised pt if BP remains elevated or pt develops any symptoms they should call provider and go to the Emergency Room, pt verbalized understanding. AVS offered, but pt declined. Pt advised to schedule next appointment at front desk. Pt is awake, alert, and oriented at discharge.

## 2022-12-17 ENCOUNTER — Encounter: Payer: Self-pay | Admitting: Family Medicine

## 2022-12-17 ENCOUNTER — Ambulatory Visit (INDEPENDENT_AMBULATORY_CARE_PROVIDER_SITE_OTHER): Payer: Medicare Other | Admitting: Family Medicine

## 2022-12-17 VITALS — BP 133/88 | HR 64 | Temp 98.0°F | Resp 16 | Wt 316.0 lb

## 2022-12-17 DIAGNOSIS — E119 Type 2 diabetes mellitus without complications: Secondary | ICD-10-CM

## 2022-12-17 DIAGNOSIS — Z6841 Body Mass Index (BMI) 40.0 and over, adult: Secondary | ICD-10-CM

## 2022-12-17 DIAGNOSIS — Z1211 Encounter for screening for malignant neoplasm of colon: Secondary | ICD-10-CM

## 2022-12-17 DIAGNOSIS — I10 Essential (primary) hypertension: Secondary | ICD-10-CM | POA: Diagnosis not present

## 2022-12-17 DIAGNOSIS — E785 Hyperlipidemia, unspecified: Secondary | ICD-10-CM | POA: Diagnosis not present

## 2022-12-17 LAB — POCT GLYCOSYLATED HEMOGLOBIN (HGB A1C): Hemoglobin A1C: 5.9 % — AB (ref 4.0–5.6)

## 2022-12-17 NOTE — Progress Notes (Signed)
Patient is here for their 6 month follow-up Patient has no concerns today Care gaps have been discussed with patient  

## 2022-12-17 NOTE — Progress Notes (Signed)
Established Patient Office Visit  Subjective    Patient ID: Nicholas Kelly., male    DOB: October 11, 1966  Age: 57 y.o. MRN: [id number]  CC:  Chief Complaint  Patient presents with   Follow-up   Diabetes    HPI Nicholas Kelly. presents for routine follow up of chronic med issues. Patient denies acute complaints or concerns.    Outpatient Encounter Medications as of 12/17/2022  Medication Sig   Accu-Chek Softclix Lancets lancets Use as instructed three times daily before meals   aspirin 81 MG tablet Take 1 tablet (81 mg total) by mouth daily.   atorvastatin (LIPITOR) 80 MG tablet Take 1 tablet (80 mg total) by mouth daily at 6 PM.   blood glucose meter kit and supplies KIT Use up to four times daily as directed.   Blood Glucose Monitoring Suppl (TRUE METRIX METER) w/Device KIT Use as instructed to check blood sugar BID.   carvedilol (COREG) 25 MG tablet Take 1 tablet (25 mg total) by mouth 2 (two) times daily with a meal.   Cholecalciferol (VITAMIN D3) 1.25 MG (50000 UT) TABS Take 50,000 Units by mouth once a week.   cloNIDine (CATAPRES) 0.2 MG tablet Take 1 tablet (0.2 mg total) by mouth 2 (two) times daily.   empagliflozin (JARDIANCE) 10 MG TABS tablet TAKE 1 TABLET BY MOUTH ONCE DAILY BEFORE BREAKFAST   empagliflozin (JARDIANCE) 10 MG TABS tablet Take 1 tablet (10 mg total) by mouth daily before breakfast.   ergocalciferol (VITAMIN D2) 1.25 MG (50000 UT) capsule Take 1 capsule (50,000 Units total) by mouth once a week.   ezetimibe (ZETIA) 10 MG tablet Take 1 tablet (10 mg total) by mouth daily.   furosemide (LASIX) 80 MG tablet Take 1 tablet (80 mg total) by mouth daily.   glucose blood (TRUE METRIX BLOOD GLUCOSE TEST) test strip use as directed   metFORMIN (GLUCOPHAGE) 1000 MG tablet Take 1 tablet (1,000 mg total) by mouth 2 (two) times daily with a meal.   ocrelizumab (OCREVUS) 300 MG/10ML injection Inject 600 mg into the vein once.   sacubitril-valsartan (ENTRESTO)  49-51 MG Take 1 tablet by mouth 2 (two) times daily.   spironolactone (ALDACTONE) 25 MG tablet Take 0.5 tablets (12.5 mg total) by mouth daily.   TRUEplus Lancets 28G MISC Use to check blood sugar twice daily.   No facility-administered encounter medications on file as of 12/17/2022.    Past Medical History:  Diagnosis Date   Acute CHF (congestive heart failure) ([place]) 02/10/2017   Dyspnea    Hypertension    Stroke [facility])     Past Surgical History:  Procedure Laterality Date   NO PAST SURGERIES     RIGHT/LEFT HEART CATH AND CORONARY ANGIOGRAPHY N/A 02/12/2017   Procedure: Right/Left Heart Cath and Coronary Angiography;  Surgeon: Belva Crome, MD;  Location: [facility];  Service: Cardiovascular;  Laterality: N/A;    Family History  Problem Relation Age of Onset   Hypertension Mother    Cardiomyopathy Mother    Stroke Maternal Aunt    Multiple sclerosis Sister     Social History   Socioeconomic History   Marital status: Divorced    Spouse name: Not on file   Number of children: 4   Years of education: Not on file   Highest education level: High school graduate  Occupational History   Not on file  Tobacco Use   Smoking status: Former    Packs/day: 1.00  Years: 34.00    Total pack years: 34.00    Types: Cigarettes    Quit date: 06/28/2016    Years since quitting: 6.4   Smokeless tobacco: Never  Vaping Use   Vaping Use: Never used  Substance and Sexual Activity   Alcohol use: Not Currently    Comment: stopped in 2018   Drug use: Not Currently    Comment: 02/10/2017 "experimented w/drugs; haven't done any for years"   Sexual activity: Not Currently  Other Topics Concern   Not on file  Social History Narrative   Lives in one story home with daughter; Left handed; some college; caffeine - occasional coffee not daily; walks 2xweek   Social Determinants of Health   Financial Resource Strain: Low Risk  (11/19/2022)   Overall Financial Resource Strain (CARDIA)     Difficulty of Paying Living Expenses: Not hard at all  Food Insecurity: No Food Insecurity (11/19/2022)   Hunger Vital Sign    Worried About Running Out of Food in the Last Year: Never true    Ran Out of Food in the Last Year: Never true  Transportation Needs: No Transportation Needs (11/19/2022)   PRAPARE - Hydrologist (Medical): No    Lack of Transportation (Non-Medical): No  Physical Activity: Inactive (11/19/2022)   Exercise Vital Sign    Days of Exercise per Week: 0 days    Minutes of Exercise per Session: 0 min  Stress: No Stress Concern Present (11/19/2022)   [place]    Feeling of Stress : Not at all  Social Connections: Not on file  Intimate Partner Violence: Not on file    Review of Systems  All other systems reviewed and are negative.       Objective    BP 133/88   Pulse 64   Temp 98 F (36.7 C) (Oral)   Resp 16   Wt (!) 316 lb (143.3 kg)   SpO2 94%   BMI 48.05 kg/m   Physical Exam Vitals and nursing note reviewed.  Constitutional:      General: He is not in acute distress.    Appearance: He is obese.  Cardiovascular:     Rate and Rhythm: Normal rate and regular rhythm.  Pulmonary:     Effort: Pulmonary effort is normal.     Breath sounds: Normal breath sounds.  Abdominal:     Palpations: Abdomen is soft.     Tenderness: There is no abdominal tenderness.  Musculoskeletal:     Right lower leg: No edema.     Left lower leg: No edema.  Neurological:     General: No focal deficit present.     Mental Status: He is alert and oriented to person, place, and time.         Assessment & Plan:   1. Type 2 diabetes mellitus without complication, without long-term current use of insulin (HCC) A1c at goal. continue - POCT glycosylated hemoglobin (Hb A1C) - Microalbumin / creatinine urine ratio; Future  2. Essential hypertension Appears stable.  continue  3. Hyperlipidemia, unspecified hyperlipidemia type Continue   4. Class 3 severe obesity due to excess calories with serious comorbidity and body mass index (BMI) of 45.0 to 49.9 in adult ([place])   5. Screening for colon cancer  - Fecal occult blood, imunochemical; Future    Return in about 6 months (around 06/17/2023) for follow up.   Becky Sax, MD

## 2023-01-05 ENCOUNTER — Other Ambulatory Visit: Payer: Self-pay

## 2023-01-05 ENCOUNTER — Other Ambulatory Visit: Payer: Self-pay | Admitting: Cardiovascular Disease

## 2023-01-05 ENCOUNTER — Other Ambulatory Visit (HOSPITAL_BASED_OUTPATIENT_CLINIC_OR_DEPARTMENT_OTHER): Payer: Self-pay

## 2023-01-05 MED ORDER — SPIRONOLACTONE 25 MG PO TABS
12.5000 mg | ORAL_TABLET | Freq: Every day | ORAL | 3 refills | Status: DC
  Filled 2023-01-05 – 2023-01-22 (×3): qty 45, 90d supply, fill #0
  Filled 2023-04-26: qty 45, 90d supply, fill #1

## 2023-01-08 ENCOUNTER — Other Ambulatory Visit (HOSPITAL_BASED_OUTPATIENT_CLINIC_OR_DEPARTMENT_OTHER): Payer: Self-pay

## 2023-01-11 ENCOUNTER — Other Ambulatory Visit: Payer: Self-pay

## 2023-01-11 ENCOUNTER — Other Ambulatory Visit (HOSPITAL_COMMUNITY): Payer: Self-pay

## 2023-01-11 ENCOUNTER — Other Ambulatory Visit (HOSPITAL_BASED_OUTPATIENT_CLINIC_OR_DEPARTMENT_OTHER): Payer: Self-pay

## 2023-01-12 ENCOUNTER — Other Ambulatory Visit: Payer: Self-pay

## 2023-01-12 ENCOUNTER — Other Ambulatory Visit: Payer: Self-pay | Admitting: Family Medicine

## 2023-01-12 ENCOUNTER — Other Ambulatory Visit (HOSPITAL_BASED_OUTPATIENT_CLINIC_OR_DEPARTMENT_OTHER): Payer: Self-pay

## 2023-01-12 ENCOUNTER — Other Ambulatory Visit: Payer: Self-pay | Admitting: Cardiovascular Disease

## 2023-01-12 DIAGNOSIS — I5042 Chronic combined systolic (congestive) and diastolic (congestive) heart failure: Secondary | ICD-10-CM

## 2023-01-12 DIAGNOSIS — E119 Type 2 diabetes mellitus without complications: Secondary | ICD-10-CM

## 2023-01-12 MED ORDER — CARVEDILOL 25 MG PO TABS
25.0000 mg | ORAL_TABLET | Freq: Two times a day (BID) | ORAL | 1 refills | Status: DC
  Filled 2023-01-12 – 2023-01-22 (×2): qty 180, 90d supply, fill #0
  Filled 2023-04-26: qty 180, 90d supply, fill #1

## 2023-01-12 MED ORDER — BLOOD GLUCOSE MONITOR KIT: PACK | 0 refills | Status: AC

## 2023-01-13 ENCOUNTER — Telehealth: Payer: Self-pay

## 2023-01-13 NOTE — Progress Notes (Signed)
This patient is appearing on the insurance-provided list for being at risk of failing the adherence measure for cholesterol and diabetes medications this calendar year.   Medication: metformin and Jardiance Last fill date: 06/11/2022 for a 30 day supply  Atorvastatin last filled 01/05/2023 for a 30 day supply; prior to that last filled 05/2022  Attempted to outreach patient to discuss adherence, any barriers to adherence, and consider a referral to me for medication management support. Patient did not answer and I left my call back number [phone]).  Joseph Art, Pharm.D. PGY-2 Ambulatory Care Pharmacy Resident

## 2023-01-14 NOTE — Telephone Encounter (Signed)
Call place to patient to see if he need anything form PCE. I did advise him  that we didn't call him but if he need anything we can help.Patient is doing well.    Copied from [place] [phone]. Topic: General - Other >> Jan 13, 2023 11:53 AM Carrielelia G wrote: Reason for CRM: patient called and stated he received a call from the office, (no message left).  I did not see a message from the office in his chart.  Please advise.

## 2023-01-15 ENCOUNTER — Ambulatory Visit: Payer: Medicare Other | Attending: Cardiovascular Disease | Admitting: Cardiovascular Disease

## 2023-01-15 ENCOUNTER — Encounter: Payer: Self-pay | Admitting: Cardiovascular Disease

## 2023-01-15 VITALS — BP 142/86 | HR 58 | Ht 68.0 in | Wt 321.8 lb

## 2023-01-15 DIAGNOSIS — I428 Other cardiomyopathies: Secondary | ICD-10-CM | POA: Diagnosis not present

## 2023-01-15 DIAGNOSIS — E119 Type 2 diabetes mellitus without complications: Secondary | ICD-10-CM | POA: Diagnosis not present

## 2023-01-15 DIAGNOSIS — E785 Hyperlipidemia, unspecified: Secondary | ICD-10-CM | POA: Diagnosis not present

## 2023-01-15 DIAGNOSIS — G4733 Obstructive sleep apnea (adult) (pediatric): Secondary | ICD-10-CM | POA: Diagnosis not present

## 2023-01-15 DIAGNOSIS — I5042 Chronic combined systolic (congestive) and diastolic (congestive) heart failure: Secondary | ICD-10-CM

## 2023-01-15 NOTE — Progress Notes (Unsigned)
Cardiology Office Note    Date:  01/17/2023   ID:  Nicholas Jefferson., DOB Mar 20, 1966, MRN [id number]  PCP:  Nicholas Mai, MD  Cardiologist:  Nicholas Majestic, MD   7 month follow-up visit.  History of Present Illness:  Nicholas Gramza. is a 57 y.o. male who presents for a 7 month follow-up cardiology evaluation.  Nicholas Kelly has a history of hypertension, and presented to [facility] hospital on 02/10/2017 from urgent care with acute onset of shortness of breath.  He was previously diagnosed with CHF in October 17 and had been on HCTZ but had run out of this prescription.  He was admitted by the hospitalist service.  A chest x-ray showed findings of CHF with pulmonary edema.  BNP was elevated at 752.  An echo Doppler study showed reduced LV function with an EF of 20-25% with moderate concentric LVH, diffuse hypokinesis, grade 3 diastolic dysfunction, mild mitral regurgitation, moderate tricuspid regurgitation, and moderate bony hypertension with a PA pressure 51 mm.  Patient was seen in cardiology consultation.  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  He did not have significant obstructive CAD with only diffuse 30-50% mid and distal RCA stenoses.  There is moderate pulmonary hypertension.  He was discharged on 02/14/2017 and has been on lisinopril 5 mg, furosemide 40 mg, carvedilol 6.25 mg twice a day in addition to aspirin 81 mg, supplemental potassium 20, now quit once and atorvastatin 40 mg.    I saw for initial post hospital evaluation on 03/11/2017.  I reviewed his hospital data in its entirety.  He was feeling improved on his initial medical regimen and I recommended further titration of his lisinopril up to 10 mg and increase carvedilol to 12.5 mg twice a day.  He did not have insurance and ws working on trying to obtain a  Medicaid card.  We talked in the future about possibly switching him to entresto.  On his increase regimen, he has felt improved.    When I saw him in June 2018 his blood pressure continued to be elevated and I recommended the addition of spironolactone at 12.5 mg for one week and then to increase this to 12.5 mg twice a day.  I also titrated lisinopril from 10 mg to 15 mg. On July 17, 2017 he presented to the hospital with complaints of diplopia and visual disturbance. 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  An MRI of the range, did not show acute abnormality but revealed chronic ischemic microangiopathy.  He had a negative CTA, except for minimal calcified atherosclerosis involving only the carotids.  During his hospitalization.  A repeat echo Doppler study showed an EF of 30% with grade 1 diastolic dysfunction.  His aortic root was mildly dilated.  Neurology was consulted and he was felt to have had a likely CVA given his reduced LV function.  He underwent outpatient neurologic rehab.  When I saw him in October 2018 blood pressure was still elevated and I further titrated lisinopril to 20 mg.  He was also taking Spironolactone 12.5 mg twice a day, furosemide 40 mg daily, carvedilol 12.5 twice daily in addition to atorvastatin 80 mg and Plavix and aspirin.  When I saw him in April 2019 he had  run out of his lisinopril for the last 2 months.  His allergist had increase aspirin to 325 and he had continued to be on Plavix, carvedilol 12.5 twice a day, furosemide 40 mg and HCTZ 25 mg.  He had lost 35 pounds since October 2018.  He was no longer taking Spironolactone.  With his reduced EF I felt he was a good candidate for initiation of Entresto.  He was gradually titrated up to maximum dose 97/103 and  saw Nicholas Kelly off [facility] in May 2019 at which time the dose was titrated to maximum.  Apparently he took that dose for a  month since he had a 30-day free samples but unfortunately when he [facility] the prescription to [facility] they were unable to fill it since he had no insurance.  When I last saw him on July 26, 2018 he was restarted with samples of and on Entresto at 49/51 mg.  At the time he also was on carvedilol 12.5 mg twice a day, furosemide 60 mg daily without edema.  He was on atorvastatin 80 mg for hyperlipidemia.    I saw him in January 2020 and over the past several months prior to that evaluation he  felt improved with reinitiation of Entresto therapy.  At times he still noted some abdominal bloating.  He was  on carvedilol 12.5 twice a day, clonidine 0.1 mg twice a day, furosemide 60 mg daily and apparently HCTZ 25 mg.  During that evaluation, he had no signs of edema and appears euvolemic on exam.  So that I could further titrate Entresto back to maximum dosing, I suggested he discontinue HCTZ and also suggested he decrease Lasix.  For several weeks I also recommended slight additional titration of carvedilol.  At his office visit  in August 2020 he was no longer taking Entresto since his samples had run out over 3 months previously.  As result he noticed recurrent symptomatology with shortness of breath.  During that evaluation, I provided him with samples of Entresto 24/26 for him to take twice a day for 2 weeks and gave him another prescription for 49/51 for titration.  He was scheduled to see his pharmacist for follow-up evaluation.  I saw him on January 06, 2021 and prior to that evaluation I had not seen him since  August 2020 but he had been evaluated by Nicholas Kelly subsequent  to my evaluation.  At his March 2022 evaluation he was no longer on Entresto and has been on carvedilol 12.5 mg twice a day, furosemide 80 mg daily, losartan 50 mg daily.  He apparently has been taking supplemental potassium, clonidine 0.1 mg twice a day and continues to be on atorvastatin 80 mg for hyperlipidemia and  Metformin 500 mg twice a day for diabetes mellitus.  He is followed by Dr. Tomi Kelly for his multiple sclerosis.  Recent LDL level was 104.  During that evaluation, his blood pressure was elevated.  I recommended he discontinue supplemental potassium and initiated spironolactone 12.5 mg daily.  He continued to be on Jardiance 10 mg and was tolerating this well.  Recommended that he have a follow-up echo Doppler study for reassessment of LV function.  Nicholas Kelly underwent a follow-up echo Doppler study on April 09, 2021.  EF was improved now in the low normal range at 50 to 55% with frequent PVCs noted throughout the study.  There was mild LVH.  He had hyperdynamic RV function.  Compared to his prior study in 2018 he has  had increased from 20 to 25%.  I saw him on May 12, 2021 at which time he was having frequent  urination and was taking furosemide 80 mg daily, Jardiance 10 mg, spironolactone 12.5 mg daily, losartan 50 mg, in addition to clonidine 0.1 mg twice a day and carvedilol 12.5 mg twice a day.  He continues to be on atorvastatin 80 mg.  He now had insurance.  He admits to weight gain.  During that evaluation since he now had insurance with [facility] I recommended reinstitution of Entresto and discontinuance of losartan.  I provided him with samples of Entresto 49/51 mg twice a day.  With his increased heart rate in the 80s and frequent PVCs I also recommended titration of carvedilol from 12.5 mg twice a day to 18.7 mg twice a day for the next week with then further increasing to 25 mg twice a day.  He continues to be on atorvastatin for hyperlipidemia.  Since my prior evaluation he was seen by our pharmacist, Nicholas Kelly, Nicholas Kelly on 2 occasions with titration of Entresto to 97/103 mg twice a day.  I saw him on July 30, 2021 he continues to have issues with obesity and blood pressure has been elevated.  He had undergone laboratory by Dr. Tomi Kelly and he was started on supplemental over-the-counter  vitamin D at just 4000 units daily, although his vitamin D level was significantly depressed at 11.  He denies any chest pain.  He denies presyncope or syncope.  During that evaluation, he had obtained insurance and was back on Entresto dosing at 97/103 mg twice a day.  Spironolactone was further titrated to 12.5 mg twice a day with continued blood pressure elevation.  Upon further questioning, he does admit to poor sleep, has frequent awakenings, snoring, and daytime somnolence.  I recommended a sleep evaluation with high suspicion for obstructive sleep apnea.  He underwent a sleep study on October 13, 2021 and met criteria for split-night evaluation.  He had severe obstructive sleep apnea during the diagnostic portion with an AHI of 85.5 and RDI of 88.7.  This CPAP was initiated at 6 cm and was titrated to 13 cm of water.  He received a new ResMed AirSense 11 CPAP machine on December 02, 2021.  On his initial download from February 7 through December 31, 2021 he met compliance with 100% use.  Average use was 5 hours and 28 minutes.  On subsequent download, average use increased to 6 hours and 36 minutes from May 27 through June 18, 2022.  AHI is 2.3 at 13 cm water pressure.  However, he showed me his download from last night June 18, 2022 and AHI had not increased to 6.4.  He typically tries to go to bed by 9 PM and often wakes up at 5 AM.  When I last saw him on June 19, 2022 he denied any chest pain.  He was having difficulty with his insurance and has not been on Entresto for the last 2 months.  He continued to be on Jardiance 10 mg, furosemide 80 mg daily, spironolactone but I am not certain if he is still taking 12.5 or 25 mg.  He also takes clonidine 0.2 mg twice a day.  He is on atorvastatin 80 mg and Zetia 10 mg for hyperlipidemia.  He is diabetic on metformin in addition to his Jardiance.  During that evaluation I provided him samples of Entresto 49/51 mg to take twice a day.  I recommended in  several months he undergo a follow-up echo Doppler study for reassessment of LV function.  He underwent an echo Doppler study September 10, 2022.  This showed low normal LV function with EF now at 50%.  There were no regional wall motion abnormalities.  There was grade 1 diastolic dysfunction.  There was mild dilation of ascending aorta at 42 mm.    Presently, he denies any significant shortness of breath or chest pain.  He admits to weight gain.  He has continued to be on carvedilol 25 mg twice a day,  Entresto 49/51 mg twice a day, furosemide 80 mg daily in addition to spironolactone 25 mg.  He is on atorvastatin 80 mg for hyperlipidemia.  Apparently, he was on Jardiance but has been off therapy for 1 month which he states was stopped by his primary physician Nicholas Kelly because his sugar was okay.  He continues to use CPAP therapy.  A download was obtained from February 21 through January 14, 2023.  Compliance is suboptimal and he states there was a week where he could not use treatment because of sinus issues.  The download over the past month as result showed only 60% of usage days.  Average usage was 6 hours and 47 minutes.  His pressure range was set at 13 to 18 cm of water and AHI was 2.1.  Over the past 2 weeks, usage has been significantly improved.  He does have mask leak.  He presents for follow-up evaluation.   Past Medical History:  Diagnosis Date   Acute CHF (congestive heart failure) ([place]) 02/10/2017   Dyspnea    Hypertension    Stroke [facility])     Past Surgical History:  Procedure Laterality Date   NO PAST SURGERIES     RIGHT/LEFT HEART CATH AND CORONARY ANGIOGRAPHY N/A 02/12/2017   Procedure: Right/Left Heart Cath and Coronary Angiography;  Surgeon: Belva Crome, MD;  Location: [facility];  Service: Cardiovascular;  Laterality: N/A;    Current Medications: Outpatient Medications Prior to Visit  Medication Sig Dispense Refill   Accu-Chek Softclix Lancets lancets Use as  instructed three times daily before meals 100 each 12   aspirin 81 MG tablet Take 1 tablet (81 mg total) by mouth daily. 30 tablet 3   atorvastatin (LIPITOR) 80 MG tablet Take 1 tablet (80 mg total) by mouth daily at 6 PM. 90 tablet 1   blood glucose meter kit and supplies KIT Use up to four times daily as directed. 1 each 0   Blood Glucose Monitoring Suppl (TRUE METRIX METER) w/Device KIT Use as instructed to check blood sugar BID. 1 kit 0   carvedilol (COREG) 25 MG tablet Take 1 tablet (25 mg total) by mouth 2 (two) times daily with a meal. 180 tablet 1   Cholecalciferol (VITAMIN D3) 1.25 MG (50000 UT) TABS Take 50,000 Units by mouth once a week. 4 tablet 5   cloNIDine (CATAPRES) 0.2 MG tablet Take 1 tablet (0.2 mg total) by mouth 2 (two) times daily. 180 tablet 3   empagliflozin (JARDIANCE) 10 MG TABS tablet Take 1 tablet (10 mg total) by mouth daily before breakfast. 90 tablet 0   ergocalciferol (VITAMIN D2) 1.25 MG (50000 UT) capsule Take 1 capsule (50,000 Units total) by mouth once a week. 4 capsule 5   ezetimibe (ZETIA) 10 MG tablet Take 1 tablet (10 mg total) by mouth daily. 90 tablet 1   furosemide (LASIX) 80 MG tablet Take 1 tablet (80 mg total)  by mouth daily. 90 tablet 1   glucose blood (TRUE METRIX BLOOD GLUCOSE TEST) test strip use as directed 100 strip 12   metFORMIN (GLUCOPHAGE) 1000 MG tablet Take 1 tablet (1,000 mg total) by mouth 2 (two) times daily with a meal. 180 tablet 1   ocrelizumab (OCREVUS) 300 MG/10ML injection Inject 600 mg into the vein once.     sacubitril-valsartan (ENTRESTO) 49-51 MG Take 1 tablet by mouth 2 (two) times daily. 60 tablet 11   spironolactone (ALDACTONE) 25 MG tablet Take 0.5 tablets (12.5 mg total) by mouth daily. 45 tablet 3   TRUEplus Lancets 28G MISC Use to check blood sugar twice daily. 100 each 6   empagliflozin (JARDIANCE) 10 MG TABS tablet TAKE 1 TABLET BY MOUTH ONCE DAILY BEFORE BREAKFAST (Patient not taking: Reported on 01/15/2023) 90 tablet 3    No facility-administered medications prior to visit.     Allergies:   Patient has no known allergies.   Social History   Socioeconomic History   Marital status: Divorced    Spouse name: Not on file   Number of children: 4   Years of education: Not on file   Highest education level: High school graduate  Occupational History   Not on file  Tobacco Use   Smoking status: Former    Packs/day: 1.00    Years: 34.00    Additional pack years: 0.00    Total pack years: 34.00    Types: Cigarettes    Quit date: 06/28/2016    Years since quitting: 6.5   Smokeless tobacco: Never  Vaping Use   Vaping Use: Never used  Substance and Sexual Activity   Alcohol use: Not Currently    Comment: stopped in 2018   Drug use: Not Currently    Comment: 02/10/2017 "experimented w/drugs; haven't done any for years"   Sexual activity: Not Currently  Other Topics Concern   Not on file  Social History Narrative   Lives in one story home with daughter; Left handed; some college; caffeine - occasional coffee not daily; walks 2xweek   Social Determinants of Health   Financial Resource Strain: Low Risk  (11/19/2022)   Overall Financial Resource Strain (CARDIA)    Difficulty of Paying Living Expenses: Not hard at all  Food Insecurity: No Food Insecurity (11/19/2022)   Hunger Vital Sign    Worried About Running Out of Food in the Last Year: Never true    [place] in the Last Year: Never true  Transportation Needs: No Transportation Needs (11/19/2022)   PRAPARE - Hydrologist (Medical): No    Lack of Transportation (Non-Medical): No  Physical Activity: Inactive (11/19/2022)   Exercise Vital Sign    Days of Exercise per Week: 0 days    Minutes of Exercise per Session: 0 min  Stress: No Stress Concern Present (11/19/2022)   [place]    Feeling of Stress : Not at all  Social Connections: Not on file      Family History:  The patient's family history includes Cardiomyopathy in his mother; Hypertension in his mother; Multiple sclerosis in his sister; Stroke in his maternal aunt.   ROS General: Negative; No fevers, chills, or night sweats;  HEENT: positive for recent visual disturbance. sinus congestion, difficulty swallowing Pulmonary: Negative; No cough, wheezing, shortness of breath, hemoptysis Cardiovascular: See HPI GI: Negative; No nausea, vomiting, diarrhea, or abdominal pain GU: Negative; No dysuria, hematuria,  or difficulty voiding Musculoskeletal: Negative; no myalgias, joint pain, or weakness Hematologic/Oncology: Negative; no easy bruising, bleeding Endocrine: Negative; no heat/cold intolerance; no diabetes Neuro: positive for previous diplopia Skin: Negative; No rashes or skin lesions Psychiatric: Negative; No behavioral problems, depression Sleep: Positive for OSA on CPAP with prior snoring, daytime sleepiness, hypersomnolence; no bruxism, restless legs, hypnogognic hallucinations, no cataplexy  An Epworth Sleepiness Scale score was calculated today, January 15, 2023 at 6, argue against residual daytime sleepiness  Other comprehensive 14 point system review is negative.   PHYSICAL EXAM:   VS:  BP (!) 142/86 (BP Location: Left Arm, Patient Position: Sitting, Cuff Size: Large)   Pulse (!) 58   Ht 5\' 8"  (1.727 m)   Wt (!) 321 lb 12.8 oz (146 kg)   SpO2 98%   BMI 48.93 kg/m     Repeat blood pressure by me was 140/70  Wt Readings from Last 3 Encounters:  01/15/23 (!) 321 lb 12.8 oz (146 kg)  12/17/22 (!) 316 lb (143.3 kg)  11/19/22 (!) 315 lb (142.9 kg)   General: Alert, oriented, no distress.  Skin: normal turgor, no rashes, warm and dry HEENT: Normocephalic, atraumatic. Pupils equal round and reactive to light; sclera anicteric; extraocular muscles intact;  Nose without nasal septal hypertrophy Mouth/Parynx benign; Mallinpatti scale 3/4 Neck: No JVD, no carotid  bruits; normal carotid upstroke Lungs: clear to ausculatation and percussion; no wheezing or rales Chest wall: without tenderness to palpitation Heart: PMI not displaced, RRR, s1 s2 normal, 1/6 systolic murmur, no diastolic murmur, no rubs, gallops, thrills, or heaves Abdomen: soft, nontender; no hepatosplenomehaly, BS+; abdominal aorta nontender and not dilated by palpation. Back: no CVA tenderness Pulses 2+ Musculoskeletal: full range of motion, normal strength, no joint deformities Extremities: no clubbing cyanosis or edema, Homan's sign negative  Neurologic: grossly nonfocal; Cranial nerves grossly wnl Psychologic: Normal mood and affect   Studies/Labs Reviewed:    January 15, 2023 ECG (independently read by me): Sinus bradycardia at 58, 1st degree AV block; RBBB, LAHB, LVH  June 19, 2022 ECG (independently read by me): Sinus rhythm with PVCs; QTc 499 msec  May 12, 2021 ECG (independently read by me): Normal sinus rhythm at 80 bpm, sinus arrhythmia, PVC, Right branch block, left ventricular hypertrophy and left axis deviation. A repeat ECG was also performed which showed sinus rhythm at 84 with frequent PVCs, possible left atrial enlargement, left axis deviation, right bundle branch block, and LVH.  January 06, 2021 ECG (independently read by me): Sinus rhythm at 87, PVCs, RBBB, LAHB, LVH  June 15, 2019 ECG (independently read by me): NSR at 64; RBBB  November 22, 2018 ECG (independently read by me): Sinus rhythm with occasional PACs as well as PVC.  Left axis deviation.  Right bundle branch block.  LVH by voltage.  July 26, 2018 ECG (independently read by me): Sinus bradycardia at 53 bpm.  Right bundle branch block. LVH by voltage; normal intervals  February 20, 2018 ECG (independently read by me): Normal sinus rhythm at 74 bpm.  Left axis deviation.  Right bundle branch block.  LVH by voltage criteria.  QTc interval 492 ms.  August 18 2017 ECG (independently read by me): Normal  sinus rhythm at 66 bpm.  Right bundle branch block with repolarization changes.  Inferolateral T change.  T-wave changes.  LVH.  QTc interval 461 ms.  04/21/2017 EKG:  EKG is ordered today. Normal sinus rhythm at 77 bpm.  LVH with repolarization changes.  PR interval  184 ms.  QTc interval 488 ms.  03/11/2017 ECG (independently read by me): Normal sinus rhythm at 73 bpm.  Left axis deviation.  LVH by voltage criteria with repolarization changes.  Recent Labs:    Latest Ref Rng & Units 08/13/2022    9:08 AM 02/11/2022    9:12 AM 12/18/2021    2:21 PM  BMP  Glucose 70 - 99 mg/dL 118  103  158   BUN 6 - 23 mg/dL 12  16  15    Creatinine 0.40 - 1.50 mg/dL 0.87  0.90  0.99   BUN/Creat Ratio 9 - 20   15   Sodium 135 - 145 mEq/L 140  142  145   Potassium 3.5 - 5.1 mEq/L 4.0  4.3  4.4   Chloride 96 - 112 mEq/L 105  105  104   CO2 19 - 32 mEq/L 28  29  23    Calcium 8.4 - 10.5 mg/dL 9.3  8.9  9.6         Latest Ref Rng & Units 08/13/2022    9:08 AM 02/11/2022    9:12 AM 06/20/2021   11:17 AM  Hepatic Function  Total Protein 6.0 - 8.3 g/dL 7.7  7.3  8.2   Albumin 3.5 - 5.2 g/dL 4.2  4.1  4.1   AST 0 - 37 U/L 16  11  18    ALT 0 - 53 U/L 12  13  18    Alk Phosphatase 39 - 117 U/L 59  61  58   Total Bilirubin 0.2 - 1.2 mg/dL 0.7  0.6  0.7        Latest Ref Rng & Units 08/13/2022    9:08 AM 02/11/2022    9:12 AM 06/20/2021   11:17 AM  CBC  WBC 4.0 - 10.5 K/uL 8.2  7.7  9.0   Hemoglobin 13.0 - 17.0 g/dL 13.1  12.8  13.1   Hematocrit 39.0 - 52.0 % 39.7  39.1  40.0   Platelets 150.0 - 400.0 K/uL 288.0  260.0  307.0    Lab Results  Component Value Date   MCV 94.0 08/13/2022   MCV 95.2 02/11/2022   MCV 94.0 06/20/2021   Lab Results  Component Value Date   TSH 3.140 05/19/2021   Lab Results  Component Value Date   HGBA1C 5.9 (A) 12/17/2022     BNP    Component Value Date/Time   BNP 7.2 12/18/2019 1556   BNP 752.4 (H) 02/10/2017 1525    ProBNP    Component Value Date/Time    PROBNP 15 05/19/2021 0819     Lipid Panel     Component Value Date/Time   CHOL 205 (H) 05/19/2021 0819   TRIG 121 05/19/2021 0819   HDL 36 (L) 05/19/2021 0819   CHOLHDL 5.7 (H) 05/19/2021 0819   CHOLHDL 4.9 07/18/2017 0558   VLDL 11 07/18/2017 0558   LDLCALC 147 (H) 05/19/2021 0819     RADIOLOGY: No results found.   Additional studies/ records that were reviewed today include:  I reviewed the patient's recent hospitalization, his cardiac consultation, cardiac catheterization report, and hospitalist records. I reviewed his hospitalization from September 22 through 07/19/2017 including imaging studies and echocardiogram.  ECHO 04/09/2021 IMPRESSIONS   1. Heart rate varied during the study - possible atrial arrythmias and  frequent PVC's were noted, also making LVEF determination difficult. Left  ventricular ejection fraction, by estimation, is 50 to 55%. The left  ventricle has low normal function. The  left ventricle has no regional wall motion abnormalities. There is mild  left ventricular hypertrophy. Indeterminate diastolic filling due to E-A  fusion.   2. Right ventricular systolic function is hyperdynamic. The right  ventricular size is normal.   3. The mitral valve is grossly normal. Trivial mitral valve  regurgitation.   4. The aortic valve is tricuspid. Aortic valve regurgitation is not  visualized.   Comparison(s): Prior images unable to be directly viewed, comparison made  by report only. Changes from prior study are noted. 07/18/2017: LVEF  20-25%.    ECHO: 09/11/2023  1. Left ventricular ejection fraction, by estimation, is 50%. The left  ventricle has low normal function. The left ventricle has no regional wall  motion abnormalities. There is mild left ventricular hypertrophy. Left  ventricular diastolic parameters are   consistent with Grade I diastolic dysfunction (impaired relaxation).   2. Right ventricular systolic function is normal. The right  ventricular  size is normal. Tricuspid regurgitation signal is inadequate for assessing  PA pressure.   3. The mitral valve is normal in structure. Trivial mitral valve  regurgitation. No evidence of mitral stenosis.   4. The aortic valve is grossly normal. Aortic valve regurgitation is  trivial. No aortic stenosis is present.   5. Aortic dilatation noted. There is mild dilatation of the aortic root,  measuring 40 mm. There is mild dilatation of the ascending aorta,  measuring 42 mm.   6. The inferior vena cava is dilated in size with <50% respiratory  variability, suggesting right atrial pressure of 15 mmHg.    ASSESSMENT:    1. Non-ischemic cardiomyopathy ([place])   2. Chronic combined systolic and diastolic CHF (congestive heart failure) ([place])   3. OSA (obstructive sleep apnea)   4. Type 2 diabetes mellitus without complication, without long-term current use of insulin ([place])   5. Morbid obesity ([place])   6. Hyperlipidemia LDL goal <70     PLAN:  Nicholas Kelly is a 57 year-old African-American gentleman who has a history of morbid obesity and has documented reduced EF noted in April 2018.  He has been an excellent candidate for [facility] and previously had been uptitrated to 97/103 twice daily with significant improvement but unfortunately had stopped therapy when he could not get additional medication.  He  was able to be restarted back on Entresto and  was titrated to 49/51 and later 97/103 mm successfully.  He again stopped using Entresto when his samples ran out which he believes may have been sometime in March or April 2020.  When I saw him in August 2020 I again reinstituted therapy.  When seen in March 2022, he was no longer taking Entresto unfortunately due to cost issues and not having insurance.  He gained significant weight up to 335 when last evaluated in July 2022.  He is subsequently obtained insurance with [employer] and has been successfully titrated back to maximum Entresto  dosing at 97/103 mg twice daily.  He also has been titrated to carvedilol 25 mg twice a day and has been on Jardiance 10 mg and spironolactone 12.5 mg daily.  At his evaluation in October 2022 he was on Entresto and with blood pressure elevation I further titrated spironolactone to 12.5 mg twice a day which should offer benefit in his blood pressure as well.  He was being treated by Dr. Tomi Kelly for vitamin D deficiency.  When seen by me in August 2023 he was not covered by insurance with [name].  I  again provided him 2 samples of 49/51 mg twice a day.  He now again has insurance.  Apparently, since I last saw him, he was taken off Jardiance by his primary physician when he was told that his sugar was okay.  However, I discussed with him today Vania Rea was also added to his regimen initially for its CHF benefit.  As result, I have recommended resumption of Jardiance 10 mg daily.  He will continue with carvedilol 25 mg twice a day, Entresto 49/51 mg twice a day, furosemide 80 mg daily in addition to spironolactone.  She is now 12.5 mg daily.  His most recent echo Doppler study from November 2023 showed LVEF of 50% on therapy.  There was grade 1 diastolic dysfunction.  He has gained significant weight since his last visit.  It was discussed the importance of weight loss and increased activity.  At a prior evaluation he had significant issues with sleep and was found to have severe obstructive sleep apnea on a split-night protocol with AHI at 85.5/h.  He had severe oxygen desaturation to a nadir of 70% with time spent below 88% at approximately 55 minutes.  He has  been on CPAP therapy since his set up date on December 02, 2021 and initially has been meeting meeting compliance standards.  At a previous evaluation I extensively discussed  the importance of continued therapy and the potential adverse cardiovascular consequences of untreated sleep apnea regarding both blood pressure, nocturnal arrhythmias, increased  incidence of atrial fibrillation, effects on glucose/insulin resistance, inflammation, GERD, and potential for nocturnal hypoxemia contributing to ischemia.  When last seen I readjusted his CPAP pressures to a range of 13 to 18 cm of water.  His most recent download from February 21 through January 14, 2023 only shows 60% of usage days but upon questioning he apparently had significant issues with sinus for at least a week resulting in him not using treatment.  Over the past 2 weeks, compliance has markedly improved.  He has been averaging over 6 hours and 47 minutes of CPAP therapy on usage days.  AHI is 2.1.  His 95th percentile pressure is 15.5 with maximum average pressure at 16.4.  He continues to be on atorvastatin 80 mg and Zetia 10 mg for hyperlipidemia.  He will follow-up with his primary MD.  I will see him in 6 months for follow-up Cardiologic evaluation.   Medication Adjustments/Labs and Tests Ordered: Current medicines are reviewed at length with the patient today.  Concerns regarding medicines are outlined above.  Medication changes, Labs and Tests ordered today are listed in the Patientt Instructions below. Patient Instructions  Medication Instructions:  RESUME Jardiance 10 mg daily  *If you need a refill on your cardiac medications before your next appointment, please call your pharmacy*  Follow-Up: At [facility], you and your health needs are our priority.  As part of our continuing mission to provide you with exceptional heart care, we have created designated Provider Care Teams.  These Care Teams include your primary Cardiologist (physician) and Advanced Practice Providers (APPs -  Physician Assistants and Nurse Practitioners) who all work together to provide you with the care you need, when you need it.  We recommend signing up for the patient portal called "MyChart".  Sign up information is provided on this After Visit Summary.  MyChart is used to connect with patients for  Virtual Visits (Telemedicine).  Patients are able to view lab/test results, encounter notes, upcoming appointments, etc.  Non-urgent  messages can be sent to your provider as well.   To learn more about what you can do with MyChart, go to [email or web address].    Your next appointment:   6 month(s)  Provider:   [name], MD         Signed, Nicholas Majestic, MD , [facility] 01/17/2023 9:54 AM    [place] [address] [place], [place],   [address] Phone: [phone]

## 2023-01-15 NOTE — Patient Instructions (Signed)
Medication Instructions:  RESUME Jardiance 10 mg daily  *If you need a refill on your cardiac medications before your next appointment, please call your pharmacy*  Follow-Up: At [facility], you and your health needs are our priority.  As part of our continuing mission to provide you with exceptional heart care, we have created designated Provider Care Teams.  These Care Teams include your primary Cardiologist (physician) and Advanced Practice Providers (APPs -  Physician Assistants and Nurse Practitioners) who all work together to provide you with the care you need, when you need it.  We recommend signing up for the patient portal called "MyChart".  Sign up information is provided on this After Visit Summary.  MyChart is used to connect with patients for Virtual Visits (Telemedicine).  Patients are able to view lab/test results, encounter notes, upcoming appointments, etc.  Non-urgent messages can be sent to your provider as well.   To learn more about what you can do with MyChart, go to [email or web address].    Your next appointment:   6 month(s)  Provider:   [name], MD

## 2023-01-17 ENCOUNTER — Encounter: Payer: Self-pay | Admitting: Cardiovascular Disease

## 2023-01-18 ENCOUNTER — Other Ambulatory Visit (HOSPITAL_BASED_OUTPATIENT_CLINIC_OR_DEPARTMENT_OTHER): Payer: Self-pay

## 2023-01-21 ENCOUNTER — Other Ambulatory Visit (HOSPITAL_BASED_OUTPATIENT_CLINIC_OR_DEPARTMENT_OTHER): Payer: Self-pay

## 2023-01-22 ENCOUNTER — Other Ambulatory Visit (HOSPITAL_BASED_OUTPATIENT_CLINIC_OR_DEPARTMENT_OTHER): Payer: Self-pay

## 2023-01-28 ENCOUNTER — Other Ambulatory Visit: Payer: Self-pay

## 2023-01-28 ENCOUNTER — Other Ambulatory Visit (HOSPITAL_BASED_OUTPATIENT_CLINIC_OR_DEPARTMENT_OTHER): Payer: Self-pay

## 2023-02-01 ENCOUNTER — Other Ambulatory Visit: Payer: Self-pay | Admitting: Neurology

## 2023-02-01 ENCOUNTER — Other Ambulatory Visit: Payer: Self-pay

## 2023-02-01 ENCOUNTER — Other Ambulatory Visit (HOSPITAL_BASED_OUTPATIENT_CLINIC_OR_DEPARTMENT_OTHER): Payer: Self-pay

## 2023-02-01 MED ORDER — ERGOCALCIFEROL 1.25 MG (50000 UT) PO CAPS: 50000.0000 [IU] | ORAL_CAPSULE | ORAL | 0 refills | Status: AC

## 2023-02-01 NOTE — Telephone Encounter (Signed)
No further refills until April 2024 with Dr.Adam Everlena Cooper

## 2023-02-02 ENCOUNTER — Other Ambulatory Visit (HOSPITAL_BASED_OUTPATIENT_CLINIC_OR_DEPARTMENT_OTHER): Payer: Self-pay

## 2023-02-05 ENCOUNTER — Other Ambulatory Visit (HOSPITAL_COMMUNITY): Payer: Self-pay

## 2023-02-09 ENCOUNTER — Other Ambulatory Visit (HOSPITAL_BASED_OUTPATIENT_CLINIC_OR_DEPARTMENT_OTHER): Payer: Self-pay

## 2023-02-11 ENCOUNTER — Telehealth: Payer: Self-pay

## 2023-02-11 NOTE — Telephone Encounter (Signed)
Patient called in stating he has an appointment with [facility], needing a medication to calm him down. Asked for it to be sent to [facility] (Ph: [phone]). His appointment is tomorrow at 1:50 pm.

## 2023-02-12 ENCOUNTER — Other Ambulatory Visit: Payer: Self-pay

## 2023-02-12 ENCOUNTER — Ambulatory Visit
Admission: RE | Admit: 2023-02-12 | Discharge: 2023-02-12 | Disposition: A | Discharge status: Home / Self Care | Payer: Medicare Other | Source: Ambulatory Visit | Attending: Neurology | Admitting: Neurology

## 2023-02-12 ENCOUNTER — Other Ambulatory Visit: Payer: Self-pay | Admitting: Neurology

## 2023-02-12 ENCOUNTER — Other Ambulatory Visit (HOSPITAL_BASED_OUTPATIENT_CLINIC_OR_DEPARTMENT_OTHER): Payer: Self-pay

## 2023-02-12 DIAGNOSIS — G35 Multiple sclerosis: Secondary | ICD-10-CM

## 2023-02-12 DIAGNOSIS — G959 Disease of spinal cord, unspecified: Secondary | ICD-10-CM

## 2023-02-12 MED ORDER — GADOPICLENOL 0.5 MMOL/ML IV SOLN
10.0000 mL | Freq: Once | INTRAVENOUS | Status: AC | PRN
  Administered 2023-02-12: 10 mL via INTRAVENOUS

## 2023-02-12 MED ORDER — DIAZEPAM 5 MG PO TABS
ORAL_TABLET | ORAL | 0 refills | Status: DC
  Filled 2023-02-12: qty 1, 1d supply, fill #0

## 2023-02-12 NOTE — Telephone Encounter (Signed)
Patient called in asking for an update, informed patient it was sent in to pharmacy.

## 2023-02-12 NOTE — Telephone Encounter (Signed)
Per Dr.Jaffe, Diazepam sent to [facility]

## 2023-02-16 NOTE — Progress Notes (Unsigned)
NEUROLOGY FOLLOW UP OFFICE NOTE  Nicholas Kelly [id number]  Assessment/Plan:   Multiple sclerosis Cervical myelopathy - asymptomatic.  Neurosurgery defers intervention at this time. Hypertension    1  DMT:  Ocrevus.  As he likely will not be set up in time to receive his next infusion in [place], I advised to return to [facility] for his August 2 infusion before permanently switching care in [place] 2. D3 50,000 IU weekly 3.  Check CBC w/diff, CMP, vit D and quantitative immunoglobulin panel today 4.  As it may take a while to get an appointment with a neurologist, I advised to find a neurologist in [place] now so that we can send a referral with his notes in order to be scheduled sooner than later to establish care.    Subjective:  Nicholas Kelly is a 57 year old left-handed black man with non-ischemic cardiomyopathy, hypertension and history of stroke who follows up for multiple sclerosis.  MRIs on 01/2023 personally reviewed.   UPDATE: Current DMT:  Ocrevus.  Other medications:  D3 50,000 IU weekly  02/12/2023 MRI BRAIN & C-SPINE W WO:  1. Unchanged appearance of demyelinating lesions in the brain and cervical cord. No new or enhancing lesions. 2. Multilevel degenerative changes in the cervical spine, worst at C2-3 and C3-4 where there is moderate spinal canal stenosis.  Multilevel neural foraminal narrowing, as described above.  08/13/2022 LABS:  IgA 455, IgG 1,645, IgM 25; CBC with WBC 8.2, HGB 13.1, HCT 39.7, PLT 288, ALC 1.9; CMP with Na 140, K 4, Cl 105, CO2 28, glucose 118, BUN 12, C4 0.87, t bili 0.7, ALP 59, AST 16, ALT 12; vit D 27.75.      Vision:  No issues Motor:  Left greater than right legs feel heavy as the day progresses.  Upper extremities feel fine.   Sensory:  No issues Pain:  No issues Gait:  Sometimes feels a little off-balance Bowel/Bladder:  sometimes decreased bowel movements.  Feels bloated.   Fatigue:  No issues Cognition:  No  issues Mood:  No issues  He is planning to move to [place] to be with his daughter.  It will be around July.     HISTORY:  In 2007, he was working at [employer] and he tripped over something and fell forward landing on his arms.  After he fell, his arms were contracted.  He was told it was due to trauma and symptoms resolved after a month.   He was admitted to [facility] on 07/17/17 for diplopia and disequilibrium.  Exam revealed disconjugate gaze, with bilateral horizontal gaze palsy with inward deviation.  CT of head showed old right parietal infarct.  MRI of brain showed encephalomalacia in the bilateral pons and right parietal lobe but no acute findings.  Findings in the pons was determined to represent the cause of symptoms, now chronic due to late presentation.  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     In August 2020, he woke up with left arm weakness and arm and hand were slight contracted.  He also noticed that his speech was not normal.  He noted some mild numbness in the left arm as well.  No associated headache, visual disturbance.  He presented to the ED about a week later on 06/15/19 for further evaluation.  CT of brain showed atrophy and chronic small vessel ischemic changes but no acute intracranial abnormality.   MRI of brain without  contrast, which was limited due to motion artifact, showed pathy confluent T2/FLAIR signal abnormality involving the periventricular cerebral white matter and pons.  Concern that this may represent demyelinating disease.  No associated restricted diffusion to suggest active demyelination.  MRI of cervical spine without contrast, also limited due to motion artifact, showed patchy cord signal abnormality involving the upper cervical spinal cord at level of C2-3 and C3-4 with no obvious cord expansion to suggest active demyelinating disease.  Also demonstrated, multilevel cervical spondylosis with diffuse mild spinal  stenosis at C2-C3 through C5-C6, mild right C4 foraminal narrowing and mild to moderate left C5 foraminal narrowing and mild bilateral C7 foraminal narrowing.  Neurologic exam in the ED revealed questionable mild dysarthria but otherwise no focal or lateralizing findings.  MRI was reviewed by the neurohospitalist who questioned that findings represented a new demyelinating process.  He was discharged with outpatient neurology follow up. MRI of brain and cervical spine from 09/19/2019 showed cerebral white matter disease suggestive of MS and probable underlying chronic small vessel ischemic changes.  Cervical spine showed hyperintensity and cord mass effect at the C2-3 and C3-4 levels with associated degenerative spinal stenosis rather than MS lesions.  He was referred to neurosurgery.   Because he was asymptomatic, surgical intervention was not recommended.  He underwent LP on 09/29/2019 where CSF analysis demonstrated over 5 oligoclonal bands and IgG index 1.82.   Labs: 09/29/2019 Lumbar Puncture:  CSF cell count 28/96% lymphs, culture with moderate WBCs but no organisms, glucose 75, protein 55, myelin basic protein <2, >5 oligoclonal bands, IgG index 1.82, cytology no malignant cells 10/11/2019 LABS:  JCV antibody positive with index of 1.66.     Imaging: 03/03/2021 MRI BRAIN W WO:  Stable exam since November of 2020. Combination of findings likely representing the presence of chronic demyelinating disease, chronic small-vessel disease, and old cortical infarction in the right frontoparietal junction region. 03/12/2021 MRI C-SPINE W WO:  1. Similar short-segment dorsal cord signal abnormality at C2/C3, which could relate to prior demyelination versus compressive myelopathy given degenerative cord mass effect at these levels. No definite new cord lesion or cord enhancement on this motion limited exam.  2. Similar multilevel degenerative change 03/12/2021 MRI T-SPINE W WO:  1. No convincing cord signal  abnormality or enhancement on this mildly motion limited exam. 2. Left paracentral disc protrusion at T9-T10 contacts and flattens the left ventral cord and narrows the left root entry zone with overall mild canal stenosis. Additional small posterior disc bulges at T6-T7, T7-T8 and T8-T9 which partially efface ventral CSF.  3. Moderate left foraminal stenosis at T8-T9, T9-T10, T10-T11, and T11-T12 with additional multilevel mild foraminal stenosis. 4. Small right pleural effusion with rounded 1.1 cm opacity in the right lower lobe (series 20, image 35), suboptimally evaluated on this study. This may represent consolidation or atelectasis, but pulmonary nodule is not excluded and CT of the chest is recommended to further characterize (particularly given the patient's smoking history). 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  PAST MEDICAL HISTORY: Past Medical History:  Diagnosis Date   Acute CHF (congestive heart failure) (HCC) 02/10/2017   Dyspnea    Hypertension    Stroke [facility])     MEDICATIONS: Current Outpatient Medications on File Prior to Visit  Medication Sig Dispense Refill   Accu-Chek Softclix Lancets lancets Use as instructed three times daily before meals 100 each 12   aspirin 81 MG tablet Take 1 tablet (81 mg total) by mouth daily. 30 tablet 3   atorvastatin (LIPITOR) 80 MG tablet Take 1  tablet (80 mg total) by mouth daily at 6 PM. 90 tablet 1   blood glucose meter kit and supplies KIT Use up to four times daily as directed. 1 each 0   Blood Glucose Monitoring Suppl (TRUE METRIX METER) w/Device KIT Use as instructed to check blood sugar BID. 1 kit 0   carvedilol (COREG) 25 MG tablet Take 1 tablet (25 mg total) by mouth 2 (two) times daily with a meal. 180 tablet 1   Cholecalciferol (VITAMIN D3) 1.25 MG (50000 UT) TABS Take 50,000 Units by mouth once a week. 4 tablet 5   cloNIDine (CATAPRES) 0.2 MG tablet Take 1 tablet (0.2 mg total) by mouth 2 (two) times daily. 180 tablet 3   diazepam (VALIUM) 5 MG tablet Take 1 tablet by mouth 30 to 40 minutes prior to MRI. 1 tablet 0   empagliflozin (JARDIANCE) 10 MG TABS tablet TAKE 1 TABLET BY MOUTH ONCE DAILY BEFORE BREAKFAST (Patient not taking: Reported on 01/15/2023) 90 tablet 3   empagliflozin (JARDIANCE) 10 MG TABS tablet Take 1 tablet (10 mg total) by mouth daily before breakfast. 90 tablet 0   ergocalciferol (VITAMIN D2) 1.25 MG (50000 UT) capsule Take 1 capsule (50,000 Units total) by mouth once a week. 4 capsule 0   ezetimibe (ZETIA) 10 MG tablet Take 1 tablet (10 mg total) by mouth daily. 90 tablet 1   furosemide (LASIX) 80 MG tablet Take 1 tablet (80 mg total) by mouth daily. 90 tablet 1   glucose blood (TRUE METRIX BLOOD GLUCOSE TEST) test strip use as directed 100 strip 12   metFORMIN (GLUCOPHAGE) 1000 MG tablet Take 1 tablet (1,000 mg total) by mouth 2 (two) times daily with a meal. 180 tablet 1   ocrelizumab (OCREVUS) 300 MG/10ML injection Inject 600 mg into the vein once.     sacubitril-valsartan (ENTRESTO) 49-51 MG Take 1 tablet by mouth 2 (two) times daily. 60 tablet 11   spironolactone (ALDACTONE) 25 MG tablet Take 0.5 tablets (12.5 mg total) by mouth daily. 45 tablet 3   TRUEplus Lancets 28G MISC Use to check blood sugar twice daily. 100 each 6   No current facility-administered medications on file prior to visit.     ALLERGIES: No Known Allergies  FAMILY HISTORY: Family History  Problem Relation Age of Onset   Hypertension Mother    Cardiomyopathy Mother    Stroke Maternal Aunt    Multiple sclerosis Sister       Objective:  Blood pressure (!) 140/80, pulse 72, height  (1.727 m), weight (!) 319 lb 3.2 oz (144.8 kg), SpO2 96 %. General: No acute distress.  Patient appears well-groomed.   Head:  Normocephalic/atraumatic Eyes:  Fundi examined but not visualized Neck: supple, no paraspinal tenderness, full range of motion Heart:  Regular rate and rhythm Back: No paraspinal tenderness Neurological Exam: alert and oriented to person, place, and time.  Speech fluent and not dysarthric, language intact.  CN II-XII  intact. Bulk and tone normal, muscle strength 5/5 throughout.  Mildly reduced finger-thumb tapping speed and amplitude on left.  Sensation to light pinprick and vibration intact.  Deep tendon reflexes 2+ throughout.  Finger to nose testing intact.  Mildly broad-based gait but steady.  Romberg negative.    Shon Millet, DO  CC: Georganna Skeans, MD

## 2023-02-18 ENCOUNTER — Encounter: Payer: Self-pay | Admitting: Neurology

## 2023-02-18 ENCOUNTER — Other Ambulatory Visit (INDEPENDENT_AMBULATORY_CARE_PROVIDER_SITE_OTHER): Payer: Medicare Other

## 2023-02-18 ENCOUNTER — Ambulatory Visit (INDEPENDENT_AMBULATORY_CARE_PROVIDER_SITE_OTHER): Payer: Medicare Other | Admitting: Neurology

## 2023-02-18 VITALS — BP 140/80 | HR 72 | Ht 68.0 in | Wt 319.2 lb

## 2023-02-18 DIAGNOSIS — G9520 Unspecified cord compression: Secondary | ICD-10-CM

## 2023-02-18 DIAGNOSIS — G35 Multiple sclerosis: Secondary | ICD-10-CM

## 2023-02-18 LAB — VITAMIN D 25 HYDROXY (VIT D DEFICIENCY, FRACTURES): VITD: 27.98 ng/mL — ABNORMAL LOW (ref 30.00–100.00)

## 2023-02-18 LAB — COMPREHENSIVE METABOLIC PANEL
ALT: 15 U/L (ref 0–53)
AST: 15 U/L (ref 0–37)
Albumin: 4.1 g/dL (ref 3.5–5.2)
Alkaline Phosphatase: 53 U/L (ref 39–117)
BUN: 15 mg/dL (ref 6–23)
CO2: 28 mEq/L (ref 19–32)
Calcium: 9.3 mg/dL (ref 8.4–10.5)
Chloride: 106 mEq/L (ref 96–112)
Creatinine, Ser: 1.18 mg/dL (ref 0.40–1.50)
GFR: 68.14 mL/min (ref >60.00)
Glucose, Bld: 107 mg/dL — ABNORMAL HIGH (ref 70–99)
Potassium: 4.2 mEq/L (ref 3.5–5.1)
Sodium: 141 mEq/L (ref 135–145)
Total Bilirubin: 0.6 mg/dL (ref 0.2–1.2)
Total Protein: 7.7 g/dL (ref 6.0–8.3)

## 2023-02-18 NOTE — Patient Instructions (Signed)
Find a neurologist in [place] now and let us know so we can send a referral Return here for your Ocrevus infusion on August 2. Continue D 50,000 IU weekly

## 2023-02-19 LAB — CBC WITH DIFFERENTIAL
Basophils Absolute: 0 10*3/uL (ref 0.0–0.2)
Basos: 0 %
EOS (ABSOLUTE): 0.5 10*3/uL — ABNORMAL HIGH (ref 0.0–0.4)
Eos: 5 %
Hematocrit: 43.2 % (ref 37.5–51.0)
Hemoglobin: 13.6 g/dL (ref 13.0–17.7)
Immature Grans (Abs): 0 10*3/uL (ref 0.0–0.1)
Immature Granulocytes: 0 %
Lymphocytes Absolute: 2 10*3/uL (ref 0.7–3.1)
Lymphs: 21 %
MCH: 31.3 pg (ref 26.6–33.0)
MCHC: 31.5 g/dL (ref 31.5–35.7)
MCV: 99 fL — ABNORMAL HIGH (ref 79–97)
Monocytes Absolute: 1.2 10*3/uL — ABNORMAL HIGH (ref 0.1–0.9)
Monocytes: 13 %
Neutrophils Absolute: 5.8 10*3/uL (ref 1.4–7.0)
Neutrophils: 61 %
RBC: 4.35 x10E6/uL (ref 4.14–5.80)
RDW: 12.1 % (ref 11.6–15.4)
WBC: 9.6 10*3/uL (ref 3.4–10.8)

## 2023-02-19 LAB — IGG, IGA, IGM
IgG (Immunoglobin G), Serum: 1666 mg/dL — ABNORMAL HIGH (ref 600–1640)
IgM, Serum: 26 mg/dL — ABNORMAL LOW (ref 50–300)
Immunoglobulin A: 461 mg/dL — ABNORMAL HIGH (ref 47–310)

## 2023-02-26 ENCOUNTER — Other Ambulatory Visit (HOSPITAL_BASED_OUTPATIENT_CLINIC_OR_DEPARTMENT_OTHER): Payer: Self-pay

## 2023-02-26 ENCOUNTER — Other Ambulatory Visit: Payer: Self-pay

## 2023-03-03 NOTE — Progress Notes (Signed)
Patient advised of results.

## 2023-03-29 ENCOUNTER — Other Ambulatory Visit: Payer: Self-pay | Admitting: Cardiovascular Disease

## 2023-03-29 ENCOUNTER — Other Ambulatory Visit: Payer: Self-pay

## 2023-03-29 DIAGNOSIS — E785 Hyperlipidemia, unspecified: Secondary | ICD-10-CM

## 2023-03-30 ENCOUNTER — Other Ambulatory Visit (HOSPITAL_BASED_OUTPATIENT_CLINIC_OR_DEPARTMENT_OTHER): Payer: Self-pay

## 2023-03-30 MED ORDER — ATORVASTATIN CALCIUM 80 MG PO TABS
80.0000 mg | ORAL_TABLET | Freq: Every day | ORAL | 3 refills | Status: DC
Start: 2023-03-30 — End: 2024-06-27
  Filled 2023-03-30 – 2023-04-07 (×2): qty 90, 90d supply, fill #0

## 2023-04-06 ENCOUNTER — Other Ambulatory Visit (HOSPITAL_BASED_OUTPATIENT_CLINIC_OR_DEPARTMENT_OTHER): Payer: Self-pay

## 2023-04-07 ENCOUNTER — Other Ambulatory Visit: Payer: Self-pay

## 2023-04-07 ENCOUNTER — Other Ambulatory Visit (HOSPITAL_BASED_OUTPATIENT_CLINIC_OR_DEPARTMENT_OTHER): Payer: Self-pay

## 2023-04-19 ENCOUNTER — Other Ambulatory Visit (HOSPITAL_BASED_OUTPATIENT_CLINIC_OR_DEPARTMENT_OTHER): Payer: Self-pay

## 2023-04-19 ENCOUNTER — Other Ambulatory Visit: Payer: Self-pay | Admitting: Cardiovascular Disease

## 2023-04-19 DIAGNOSIS — E785 Hyperlipidemia, unspecified: Secondary | ICD-10-CM

## 2023-04-19 MED ORDER — EZETIMIBE 10 MG PO TABS
10.0000 mg | ORAL_TABLET | Freq: Every day | ORAL | 1 refills | Status: DC
Start: 2023-04-19 — End: 2023-09-30
  Filled 2023-04-19: qty 90, 90d supply, fill #0

## 2023-04-28 ENCOUNTER — Other Ambulatory Visit (HOSPITAL_BASED_OUTPATIENT_CLINIC_OR_DEPARTMENT_OTHER): Payer: Self-pay

## 2023-05-05 ENCOUNTER — Telehealth: Payer: Self-pay | Admitting: Neurology

## 2023-05-05 NOTE — Telephone Encounter (Signed)
Patient is moving out of state and is giving new address for Neuro referral.   [address] [place] [address]/[place]

## 2023-05-06 ENCOUNTER — Other Ambulatory Visit: Payer: Self-pay | Admitting: Cardiovascular Disease

## 2023-05-06 ENCOUNTER — Other Ambulatory Visit (HOSPITAL_BASED_OUTPATIENT_CLINIC_OR_DEPARTMENT_OTHER): Payer: Self-pay

## 2023-05-06 DIAGNOSIS — E1165 Type 2 diabetes mellitus with hyperglycemia: Secondary | ICD-10-CM

## 2023-05-06 MED ORDER — METFORMIN HCL 1000 MG PO TABS
1000.0000 mg | ORAL_TABLET | Freq: Two times a day (BID) | ORAL | 1 refills | Status: AC
Start: 2023-05-06 — End: ?

## 2023-05-14 ENCOUNTER — Telehealth: Payer: Self-pay | Admitting: Cardiovascular Disease

## 2023-05-14 NOTE — Telephone Encounter (Signed)
Patient stated he will be moving to [facility] and wants a referral from Dr. Tresa Endo to a cardiologist in that area.

## 2023-05-14 NOTE — Telephone Encounter (Signed)
Spoke to the patient, he is moving to [facility] and will like for Dr. Tresa Endo to give an referral to a cardiologist. Nicholas Kelly pt MD is currently not in the office, pt voiced understanding. Will forward to MD and nurse for advise.

## 2023-05-21 NOTE — Telephone Encounter (Signed)
Called pt, pt was told to call insurance for assistance with cards doctors nearest him. Patient verbalized understanding

## 2023-05-28 ENCOUNTER — Non-Acute Institutional Stay (HOSPITAL_COMMUNITY)
Admission: RE | Admit: 2023-05-28 | Discharge: 2023-05-28 | Disposition: A | Discharge status: Home / Self Care | Payer: Medicare Other | Source: Ambulatory Visit | Attending: Internal Medicine | Admitting: Internal Medicine

## 2023-05-28 DIAGNOSIS — G35 Multiple sclerosis: Secondary | ICD-10-CM | POA: Diagnosis not present

## 2023-05-28 MED ORDER — DIPHENHYDRAMINE HCL 50 MG/ML IJ SOLN
25.0000 mg | Freq: Once | INTRAMUSCULAR | Status: AC
  Administered 2023-05-28: 25 mg via INTRAVENOUS
  Filled 2023-05-28: qty 1

## 2023-05-28 MED ORDER — SODIUM CHLORIDE 0.9 % IV SOLN
600.0000 mg | Freq: Once | INTRAVENOUS | Status: AC
  Administered 2023-05-28: 600 mg via INTRAVENOUS
  Filled 2023-05-28: qty 20

## 2023-05-28 MED ORDER — METHYLPREDNISOLONE SODIUM SUCC 125 MG IJ SOLR
125.0000 mg | Freq: Once | INTRAMUSCULAR | Status: AC
  Administered 2023-05-28: 125 mg via INTRAVENOUS
  Filled 2023-05-28: qty 2

## 2023-05-28 MED ORDER — FAMOTIDINE IN NACL 20-0.9 MG/50ML-% IV SOLN
20.0000 mg | Freq: Once | INTRAVENOUS | Status: AC
  Administered 2023-05-28: 20 mg via INTRAVENOUS
  Filled 2023-05-28: qty 50

## 2023-05-28 MED ORDER — SODIUM CHLORIDE 0.9 % IV SOLN: INTRAVENOUS | Status: DC | PRN

## 2023-05-28 MED ORDER — ACETAMINOPHEN 325 MG PO TABS
650.0000 mg | ORAL_TABLET | Freq: Once | ORAL | Status: AC
  Administered 2023-05-28: 650 mg via ORAL
  Filled 2023-05-28: qty 2

## 2023-05-28 NOTE — Progress Notes (Signed)
PATIENT CARE CENTER NOTE:  Diagnosis: Multiple Sclerosis ( HCC) [G35]  Provider: Shon Millet DO  Procedure: Ocrevus 600mg  infusion    Patient received IV Ocrevus ( dose #2 of 2) . Pre infusion medications - Benadryl IV, Solu-medrol IV. Tylenol PO, and Pepcid IV were given. Medication was titrated per protocol. Patient declined to wait for the 60 minutes post infusion observation. Tolerated well, vitals stable, discharge instructions given, verbalized understanding. Pt states he is moving to [place] today, he will no longer receive infusions at the [facility], Dr. Everlena Cooper aware.  Patient alert, oriented and ambulatory at the time of discharge

## 2023-06-17 ENCOUNTER — Ambulatory Visit: Payer: Medicare Other | Admitting: Family Medicine

## 2023-06-17 NOTE — Addendum Note (Signed)
Encounter addended by: Quentin Angst, MD on: 06/17/2023 5:32 PM  Actions taken: Charge Capture section accepted

## 2023-06-30 ENCOUNTER — Other Ambulatory Visit: Payer: Self-pay

## 2023-06-30 ENCOUNTER — Other Ambulatory Visit (HOSPITAL_BASED_OUTPATIENT_CLINIC_OR_DEPARTMENT_OTHER): Payer: Self-pay

## 2023-07-02 ENCOUNTER — Other Ambulatory Visit (HOSPITAL_BASED_OUTPATIENT_CLINIC_OR_DEPARTMENT_OTHER): Payer: Self-pay

## 2023-07-02 ENCOUNTER — Other Ambulatory Visit: Payer: Self-pay | Admitting: Cardiovascular Disease

## 2023-07-02 DIAGNOSIS — E119 Type 2 diabetes mellitus without complications: Secondary | ICD-10-CM

## 2023-07-02 DIAGNOSIS — I5042 Chronic combined systolic (congestive) and diastolic (congestive) heart failure: Secondary | ICD-10-CM

## 2023-07-02 MED ORDER — EMPAGLIFLOZIN 10 MG PO TABS
10.0000 mg | ORAL_TABLET | Freq: Every day | ORAL | 3 refills | Status: AC
Start: 2023-07-02 — End: 2024-07-01
  Filled 2023-07-02: qty 30, 30d supply, fill #0

## 2023-07-02 MED ORDER — ENTRESTO 49-51 MG PO TABS
1.0000 | ORAL_TABLET | Freq: Two times a day (BID) | ORAL | 3 refills | Status: AC
Start: 2023-07-02 — End: ?
  Filled 2023-07-02: qty 60, 30d supply, fill #0

## 2023-07-05 ENCOUNTER — Other Ambulatory Visit: Payer: Self-pay

## 2023-07-05 ENCOUNTER — Other Ambulatory Visit (HOSPITAL_COMMUNITY): Payer: Self-pay

## 2023-07-05 ENCOUNTER — Other Ambulatory Visit (HOSPITAL_BASED_OUTPATIENT_CLINIC_OR_DEPARTMENT_OTHER): Payer: Self-pay

## 2023-07-26 ENCOUNTER — Other Ambulatory Visit: Payer: Self-pay | Admitting: Cardiovascular Disease

## 2023-07-26 ENCOUNTER — Other Ambulatory Visit: Payer: Self-pay

## 2023-07-26 DIAGNOSIS — I5042 Chronic combined systolic (congestive) and diastolic (congestive) heart failure: Secondary | ICD-10-CM

## 2023-07-28 ENCOUNTER — Other Ambulatory Visit (HOSPITAL_BASED_OUTPATIENT_CLINIC_OR_DEPARTMENT_OTHER): Payer: Self-pay

## 2023-07-28 MED ORDER — CARVEDILOL 25 MG PO TABS
25.0000 mg | ORAL_TABLET | Freq: Two times a day (BID) | ORAL | 1 refills | Status: AC
Start: 2023-07-28 — End: ?

## 2023-08-23 ENCOUNTER — Telehealth: Payer: Self-pay

## 2023-08-23 DIAGNOSIS — G35 Multiple sclerosis: Secondary | ICD-10-CM

## 2023-08-23 NOTE — Telephone Encounter (Signed)
Patient advised of DR.Jaffe note from his last visit,01/2023 1  DMT:  Ocrevus.  As he likely will not be set up in time to receive his next infusion in [place], I advised to return to [facility] for his August 2 infusion before permanently switching care in [place]  4.  As it may take a while to get an appointment with a neurologist, I advised to find a neurologist in [place] now so that we can send a referral with his notes in order to be scheduled sooner than later to establish care.    Patient missed understood and thought the office will call a neurologist in [place] for him.    Advised patient to call his Insurance company to see which is in Network.

## 2023-08-25 ENCOUNTER — Other Ambulatory Visit (HOSPITAL_BASED_OUTPATIENT_CLINIC_OR_DEPARTMENT_OTHER): Payer: Self-pay

## 2023-08-27 NOTE — Telephone Encounter (Signed)
Pt called in stating he has found a neurologist he would like for [place] to send a referral to. Please send a referral to Dr. Wandra Feinstein their address is [address] [place], [place] [address]. Their phone number is [phone].

## 2023-08-27 NOTE — Telephone Encounter (Signed)
Had to lmovm for office to call us back with fax number to fax over the records for the patient.

## 2023-09-01 NOTE — Telephone Encounter (Signed)
Fax number received, Fax referral over to [phone].

## 2023-09-27 ENCOUNTER — Other Ambulatory Visit: Payer: Self-pay | Admitting: Cardiovascular Disease

## 2023-09-27 DIAGNOSIS — E785 Hyperlipidemia, unspecified: Secondary | ICD-10-CM

## 2023-12-21 ENCOUNTER — Other Ambulatory Visit: Payer: Self-pay | Admitting: Cardiovascular Disease

## 2023-12-24 ENCOUNTER — Other Ambulatory Visit: Payer: Self-pay | Admitting: Cardiovascular Disease

## 2023-12-24 DIAGNOSIS — E785 Hyperlipidemia, unspecified: Secondary | ICD-10-CM

## 2024-01-30 ENCOUNTER — Other Ambulatory Visit: Payer: Self-pay | Admitting: Cardiovascular Disease

## 2024-02-01 ENCOUNTER — Telehealth: Payer: Self-pay | Admitting: Neurology

## 2024-02-01 NOTE — Telephone Encounter (Signed)
 Spoke to taylor at [facility] [phone], patient new provider for Ocrevus wanted last Hep B lab results for his records.   Lab Hep B 2021 will be faxed over to [phone]

## 2024-02-01 NOTE — Telephone Encounter (Signed)
 [facility] asking for fax of labs from 08/13/22, sent to [facility]

## 2024-02-17 ENCOUNTER — Telehealth: Payer: Self-pay

## 2024-02-17 NOTE — Telephone Encounter (Signed)
 Copied from CRM [id number]. Topic: Clinical - Medical Advice >> Feb 17, 2024 10:44 AM Baldemar Lev wrote: Reason for CRM: Pt called requesting to discuss the treatment that he received at [facility], needs it for new healthcare in [place] .  Best contact: [phone]

## 2024-02-17 NOTE — Telephone Encounter (Signed)
 Angelica can  you call and f/up please

## 2024-02-21 NOTE — Telephone Encounter (Signed)
 Left vm for patient to give me a call back. I will following via mychart message as well.

## 2024-02-22 NOTE — Telephone Encounter (Signed)
 Reason for CRM: Patient returning Anjelica's phone call

## 2024-03-25 ENCOUNTER — Other Ambulatory Visit: Payer: Self-pay | Admitting: Cardiovascular Disease

## 2024-03-25 DIAGNOSIS — E785 Hyperlipidemia, unspecified: Secondary | ICD-10-CM

## 2024-06-27 ENCOUNTER — Other Ambulatory Visit: Payer: Self-pay

## 2024-06-27 DIAGNOSIS — E785 Hyperlipidemia, unspecified: Secondary | ICD-10-CM

## 2024-06-28 MED ORDER — ATORVASTATIN CALCIUM 80 MG PO TABS
80.0000 mg | ORAL_TABLET | Freq: Every day | ORAL | 0 refills | Status: AC
Start: 2024-06-28 — End: ?
